# Patient Record
Sex: Male | Born: 1950 | Race: White | Hispanic: No | Marital: Married | Smoking: Never smoker
Health system: Southern US, Community
[De-identification: ages and names within clinical notes are randomized; demographics above are authoritative.]

## PROBLEM LIST (undated history)

## (undated) DIAGNOSIS — Z87442 Personal history of urinary calculi: Secondary | ICD-10-CM

## (undated) DIAGNOSIS — E669 Obesity, unspecified: Secondary | ICD-10-CM

## (undated) DIAGNOSIS — D373 Neoplasm of uncertain behavior of appendix: Secondary | ICD-10-CM

## (undated) DIAGNOSIS — M109 Gout, unspecified: Secondary | ICD-10-CM

## (undated) DIAGNOSIS — I219 Acute myocardial infarction, unspecified: Secondary | ICD-10-CM

## (undated) DIAGNOSIS — E119 Type 2 diabetes mellitus without complications: Secondary | ICD-10-CM

## (undated) DIAGNOSIS — M1990 Unspecified osteoarthritis, unspecified site: Secondary | ICD-10-CM

## (undated) DIAGNOSIS — M722 Plantar fascial fibromatosis: Secondary | ICD-10-CM

## (undated) DIAGNOSIS — R351 Nocturia: Secondary | ICD-10-CM

## (undated) DIAGNOSIS — I10 Essential (primary) hypertension: Secondary | ICD-10-CM

## (undated) DIAGNOSIS — M7650 Patellar tendinitis, unspecified knee: Secondary | ICD-10-CM

## (undated) DIAGNOSIS — Z86010 Personal history of colonic polyps: Secondary | ICD-10-CM

## (undated) DIAGNOSIS — I2510 Atherosclerotic heart disease of native coronary artery without angina pectoris: Secondary | ICD-10-CM

## (undated) DIAGNOSIS — M7989 Other specified soft tissue disorders: Secondary | ICD-10-CM

## (undated) DIAGNOSIS — E785 Hyperlipidemia, unspecified: Secondary | ICD-10-CM

## (undated) HISTORY — PX: HERNIA REPAIR: SHX51

## (undated) HISTORY — DX: Personal history of colonic polyps: Z86.010

## (undated) HISTORY — DX: Obesity, unspecified: E66.9

## (undated) HISTORY — DX: Atherosclerotic heart disease of native coronary artery without angina pectoris: I25.10

## (undated) HISTORY — DX: Neoplasm of uncertain behavior of appendix: D37.3

## (undated) HISTORY — DX: Gout, unspecified: M10.9

## (undated) HISTORY — PX: APPENDECTOMY: SHX54

## (undated) HISTORY — DX: Hyperlipidemia, unspecified: E78.5

## (undated) HISTORY — PX: TONSILLECTOMY: SUR1361

## (undated) HISTORY — DX: Essential (primary) hypertension: I10

---

## 1995-08-03 DIAGNOSIS — I219 Acute myocardial infarction, unspecified: Secondary | ICD-10-CM

## 1995-08-03 HISTORY — DX: Acute myocardial infarction, unspecified: I21.9

## 1995-08-03 HISTORY — PX: CORONARY ARTERY BYPASS GRAFT: SHX141

## 2005-01-07 ENCOUNTER — Ambulatory Visit: Payer: Self-pay | Admitting: Internal Medicine

## 2005-01-21 ENCOUNTER — Ambulatory Visit: Payer: Self-pay | Admitting: Internal Medicine

## 2005-02-17 ENCOUNTER — Ambulatory Visit: Payer: Self-pay | Admitting: Internal Medicine

## 2005-02-22 ENCOUNTER — Ambulatory Visit: Payer: Self-pay | Admitting: Internal Medicine

## 2005-03-03 ENCOUNTER — Ambulatory Visit: Payer: Self-pay | Admitting: Gastroenterology

## 2005-03-22 ENCOUNTER — Ambulatory Visit: Payer: Self-pay | Admitting: Internal Medicine

## 2005-03-30 ENCOUNTER — Ambulatory Visit: Payer: Self-pay | Admitting: Internal Medicine

## 2005-05-25 ENCOUNTER — Ambulatory Visit: Payer: Self-pay | Admitting: Internal Medicine

## 2005-06-07 ENCOUNTER — Ambulatory Visit: Payer: Self-pay | Admitting: Internal Medicine

## 2005-08-02 DIAGNOSIS — D373 Neoplasm of uncertain behavior of appendix: Secondary | ICD-10-CM

## 2005-08-02 HISTORY — PX: HEMICOLECTOMY: SHX854

## 2005-08-02 HISTORY — DX: Neoplasm of uncertain behavior of appendix: D37.3

## 2005-09-10 ENCOUNTER — Ambulatory Visit: Payer: Self-pay | Admitting: Internal Medicine

## 2005-09-24 ENCOUNTER — Ambulatory Visit: Payer: Self-pay | Admitting: Internal Medicine

## 2006-03-14 ENCOUNTER — Encounter (INDEPENDENT_AMBULATORY_CARE_PROVIDER_SITE_OTHER): Payer: Self-pay | Admitting: Specialist

## 2006-03-14 ENCOUNTER — Inpatient Hospital Stay (HOSPITAL_COMMUNITY): Admission: EM | Admit: 2006-03-14 | Discharge: 2006-03-20 | Payer: Self-pay | Admitting: Emergency Medicine

## 2006-03-14 ENCOUNTER — Ambulatory Visit: Payer: Self-pay | Admitting: Cardiovascular Disease

## 2006-03-14 ENCOUNTER — Ambulatory Visit: Payer: Self-pay | Admitting: Internal Medicine

## 2006-08-01 ENCOUNTER — Ambulatory Visit: Payer: Self-pay | Admitting: Internal Medicine

## 2006-08-01 LAB — CONVERTED CEMR LAB
ALT: 45 units/L — ABNORMAL HIGH (ref 0–40)
AST: 32 units/L (ref 0–37)
Albumin: 4.2 g/dL (ref 3.5–5.2)
Alkaline Phosphatase: 45 units/L (ref 39–117)
BUN: 19 mg/dL (ref 6–23)
Basophils Absolute: 0 10*3/uL (ref 0.0–0.1)
Basophils Relative: 0.6 % (ref 0.0–1.0)
CO2: 33 meq/L — ABNORMAL HIGH (ref 19–32)
Calcium: 9.1 mg/dL (ref 8.4–10.5)
Chloride: 102 meq/L (ref 96–112)
Chol/HDL Ratio, serum: 4.3
Cholesterol: 209 mg/dL (ref 0–200)
Creatinine, Ser: 0.9 mg/dL (ref 0.4–1.5)
Eosinophil percent: 1.8 % (ref 0.0–5.0)
GFR calc non Af Amer: 93 mL/min
Glomerular Filtration Rate, Af Am: 113 mL/min/{1.73_m2}
Glucose, Bld: 99 mg/dL (ref 70–99)
HCT: 40 % (ref 39.0–52.0)
HDL: 48.1 mg/dL (ref >39.0)
Hemoglobin: 13.7 g/dL (ref 13.0–17.0)
LDL DIRECT: 114.2 mg/dL
Lymphocytes Relative: 52.2 % — ABNORMAL HIGH (ref 12.0–46.0)
MCHC: 34.3 g/dL (ref 30.0–36.0)
MCV: 91.5 fL (ref 78.0–100.0)
Monocytes Absolute: 0.6 10*3/uL (ref 0.2–0.7)
Monocytes Relative: 11.3 % — ABNORMAL HIGH (ref 3.0–11.0)
Neutro Abs: 1.8 10*3/uL (ref 1.4–7.7)
Neutrophils Relative %: 34.1 % — ABNORMAL LOW (ref 43.0–77.0)
PSA: 0.42 ng/mL (ref 0.10–4.00)
Platelets: 215 10*3/uL (ref 150–400)
Potassium: 4.7 meq/L (ref 3.5–5.1)
RBC: 4.37 M/uL (ref 4.22–5.81)
RDW: 12.8 % (ref 11.5–14.6)
Sodium: 139 meq/L (ref 135–145)
TSH: 4.93 microintl units/mL (ref 0.35–5.50)
Total Bilirubin: 1.1 mg/dL (ref 0.3–1.2)
Total Protein: 6.9 g/dL (ref 6.0–8.3)
Triglyceride fasting, serum: 269 mg/dL (ref 0–149)
VLDL: 54 mg/dL — ABNORMAL HIGH (ref 0–40)
WBC: 5.3 10*3/uL (ref 4.5–10.5)

## 2006-08-09 ENCOUNTER — Ambulatory Visit: Payer: Self-pay | Admitting: Internal Medicine

## 2006-08-23 ENCOUNTER — Encounter: Admission: RE | Admit: 2006-08-23 | Discharge: 2006-08-23 | Payer: Self-pay | Admitting: Surgery

## 2006-09-08 ENCOUNTER — Inpatient Hospital Stay (HOSPITAL_COMMUNITY): Admission: RE | Admit: 2006-09-08 | Discharge: 2006-09-11 | Payer: Self-pay | Admitting: Surgery

## 2006-11-08 ENCOUNTER — Ambulatory Visit: Payer: Self-pay | Admitting: Internal Medicine

## 2007-02-21 ENCOUNTER — Ambulatory Visit: Payer: Self-pay | Admitting: Internal Medicine

## 2007-02-21 DIAGNOSIS — E785 Hyperlipidemia, unspecified: Secondary | ICD-10-CM | POA: Insufficient documentation

## 2007-02-21 DIAGNOSIS — I2510 Atherosclerotic heart disease of native coronary artery without angina pectoris: Secondary | ICD-10-CM | POA: Insufficient documentation

## 2007-02-21 DIAGNOSIS — N200 Calculus of kidney: Secondary | ICD-10-CM | POA: Insufficient documentation

## 2007-02-21 DIAGNOSIS — I10 Essential (primary) hypertension: Secondary | ICD-10-CM | POA: Insufficient documentation

## 2007-03-09 ENCOUNTER — Encounter: Payer: Self-pay | Admitting: Internal Medicine

## 2007-03-09 ENCOUNTER — Ambulatory Visit: Payer: Self-pay

## 2007-03-14 ENCOUNTER — Telehealth: Payer: Self-pay | Admitting: Internal Medicine

## 2007-04-15 IMAGING — CR DG CHEST 2V
2 series · 2 of 2 positions shown · non-contrast
Comparison: None.

CLINICAL DATA: Preop for ventral hernia/chest pain with inspiration.
 CHEST - 2 VIEW:

[view not recorded (1 of 2)]
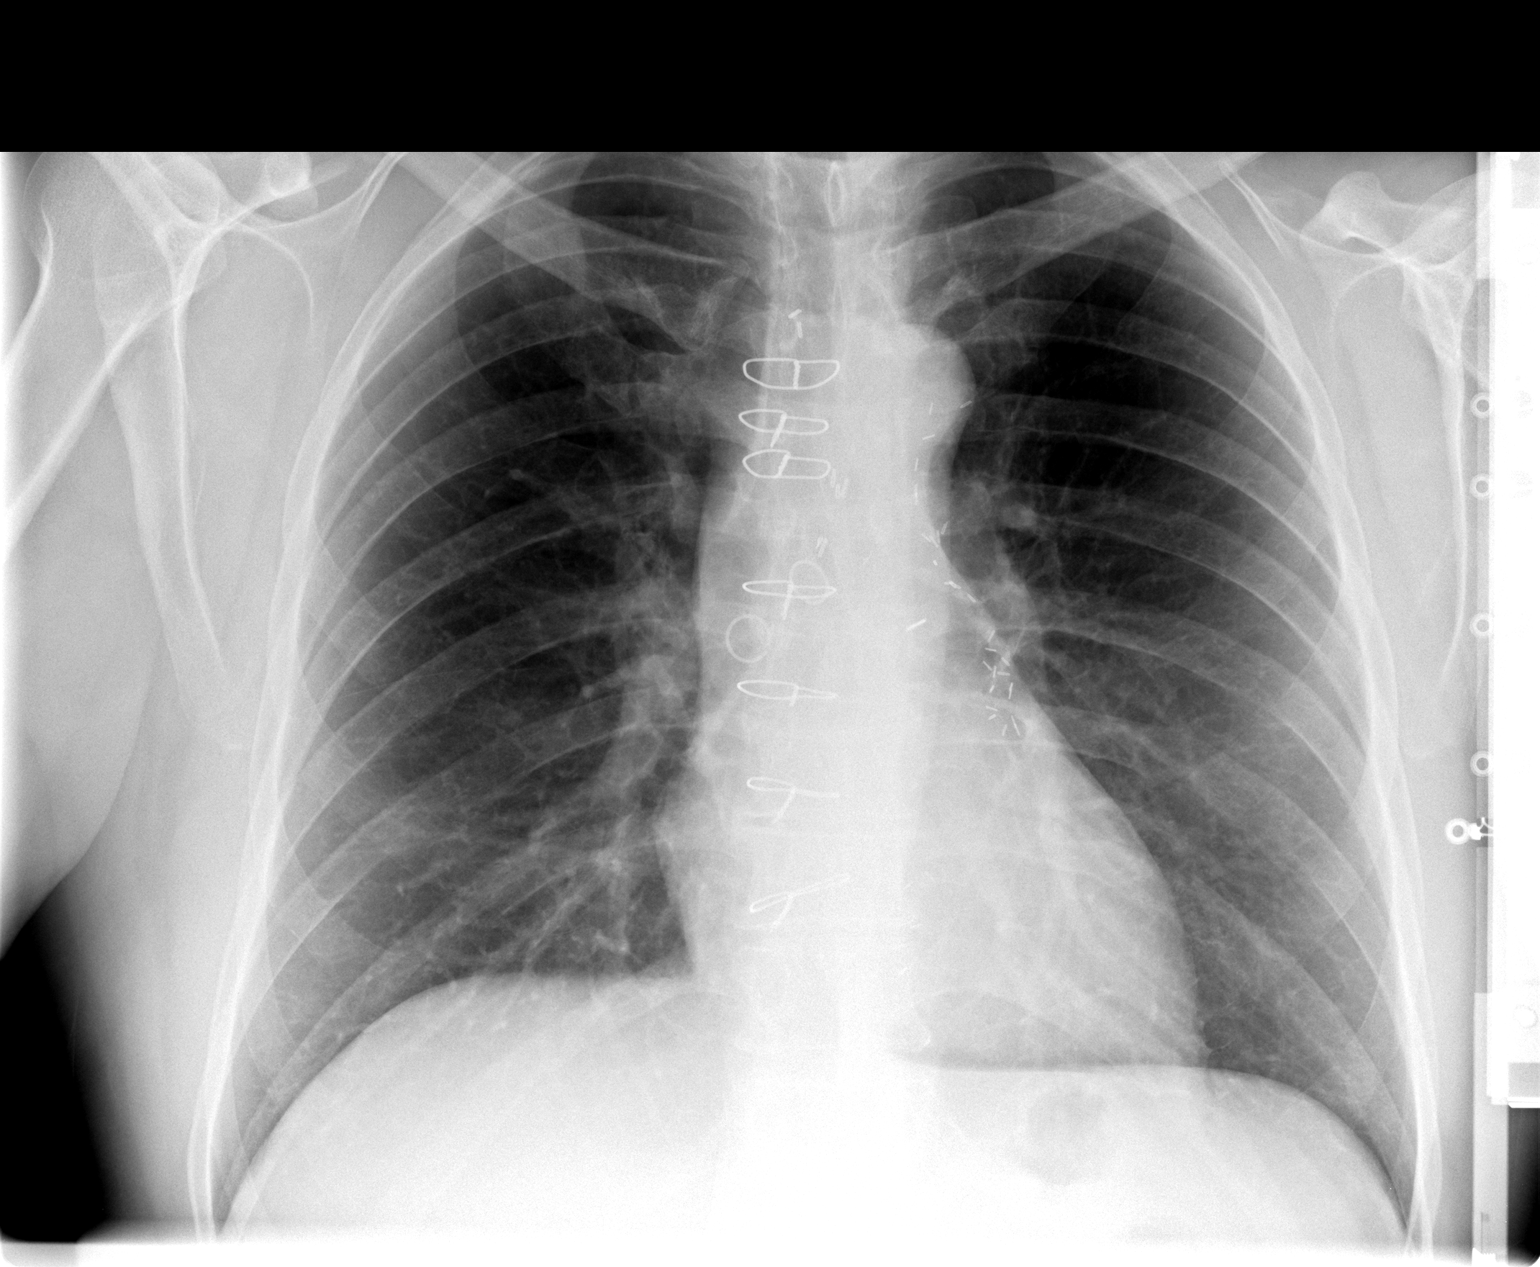

[view not recorded (2 of 2)]
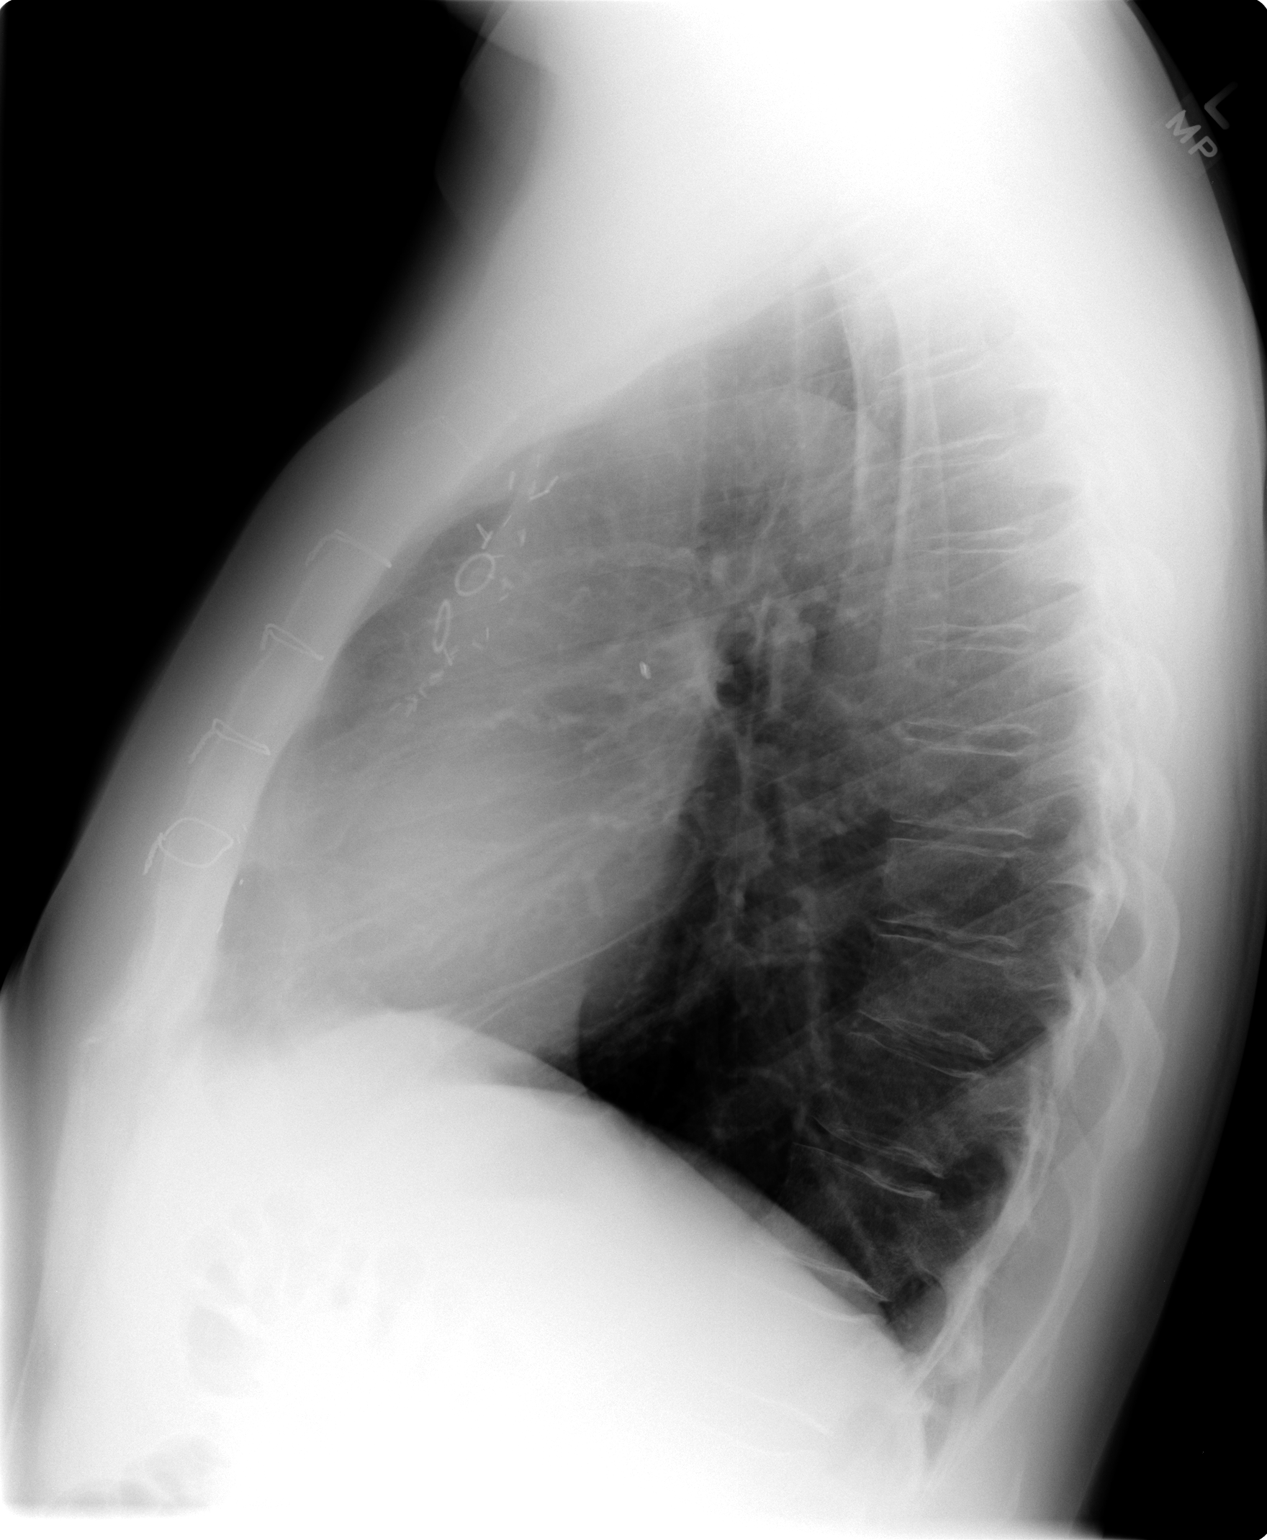

[2 of 2 positions shown; findings below may reference images not displayed]

FINDINGS: Post-coronary bypass graft. Heart and lungs normal. No pleural fluid or osseous lesions.
IMPRESSION: Post-CABG ? no active disease.

## 2007-07-06 ENCOUNTER — Telehealth: Payer: Self-pay | Admitting: Internal Medicine

## 2007-11-30 ENCOUNTER — Telehealth: Payer: Self-pay | Admitting: Internal Medicine

## 2008-01-01 ENCOUNTER — Ambulatory Visit: Payer: Self-pay | Admitting: Internal Medicine

## 2008-01-01 LAB — CONVERTED CEMR LAB
ALT: 83 units/L — ABNORMAL HIGH (ref 0–53)
AST: 69 units/L — ABNORMAL HIGH (ref 0–37)
Albumin: 4.1 g/dL (ref 3.5–5.2)
BUN: 14 mg/dL (ref 6–23)
Basophils Absolute: 0 10*3/uL (ref 0.0–0.1)
Basophils Relative: 0.1 % (ref 0.0–1.0)
Bilirubin Urine: NEGATIVE
CO2: 27 meq/L (ref 19–32)
Calcium: 9.5 mg/dL (ref 8.4–10.5)
Chloride: 103 meq/L (ref 96–112)
Cholesterol: 146 mg/dL (ref 0–200)
Creatinine, Ser: 1 mg/dL (ref 0.4–1.5)
Eosinophils Relative: 2 % (ref 0.0–5.0)
Glucose, Bld: 89 mg/dL (ref 70–99)
Glucose, Urine, Semiquant: NEGATIVE
Hemoglobin: 13.7 g/dL (ref 13.0–17.0)
LDL Cholesterol: 76 mg/dL (ref 0–99)
Lymphocytes Relative: 48 % — ABNORMAL HIGH (ref 12.0–46.0)
MCHC: 34.6 g/dL (ref 30.0–36.0)
Neutro Abs: 1.7 10*3/uL (ref 1.4–7.7)
Neutrophils Relative %: 34.6 % — ABNORMAL LOW (ref 43.0–77.0)
PSA: 0.53 ng/mL (ref 0.10–4.00)
RBC: 4.18 M/uL — ABNORMAL LOW (ref 4.22–5.81)
TSH: 3.17 microintl units/mL (ref 0.35–5.50)
Total Protein: 7.2 g/dL (ref 6.0–8.3)
Urobilinogen, UA: 0.2
WBC Urine, dipstick: NEGATIVE
WBC: 4.8 10*3/uL (ref 4.5–10.5)
pH: 5

## 2008-01-29 ENCOUNTER — Ambulatory Visit: Payer: Self-pay | Admitting: Internal Medicine

## 2008-12-25 ENCOUNTER — Encounter: Payer: Self-pay | Admitting: Internal Medicine

## 2009-02-21 ENCOUNTER — Ambulatory Visit: Payer: Self-pay | Admitting: Family Medicine

## 2009-07-02 ENCOUNTER — Telehealth: Payer: Self-pay | Admitting: Internal Medicine

## 2009-07-04 ENCOUNTER — Telehealth: Payer: Self-pay | Admitting: Internal Medicine

## 2009-07-11 ENCOUNTER — Ambulatory Visit: Payer: Self-pay | Admitting: Internal Medicine

## 2009-07-11 LAB — CONVERTED CEMR LAB
ALT: 57 units/L — ABNORMAL HIGH (ref 0–53)
Alkaline Phosphatase: 53 units/L (ref 39–117)
BUN: 15 mg/dL (ref 6–23)
Basophils Absolute: 0 10*3/uL (ref 0.0–0.1)
Bilirubin, Direct: 0.1 mg/dL (ref 0.0–0.3)
Creatinine, Ser: 0.8 mg/dL (ref 0.4–1.5)
Direct LDL: 96 mg/dL
Eosinophils Absolute: 0.1 10*3/uL (ref 0.0–0.7)
GFR calc non Af Amer: 105.44 mL/min (ref >60)
Glucose, Bld: 111 mg/dL — ABNORMAL HIGH (ref 70–99)
HCT: 40 % (ref 39.0–52.0)
Hemoglobin: 13.3 g/dL (ref 13.0–17.0)
Lymphocytes Relative: 44.2 % (ref 12.0–46.0)
Lymphs Abs: 2 10*3/uL (ref 0.7–4.0)
MCHC: 33.1 g/dL (ref 30.0–36.0)
Monocytes Relative: 11.4 % (ref 3.0–12.0)
Neutro Abs: 1.8 10*3/uL (ref 1.4–7.7)
Platelets: 169 10*3/uL (ref 150.0–400.0)
Potassium: 4.4 meq/L (ref 3.5–5.1)
RDW: 12.3 % (ref 11.5–14.6)
TSH: 2.42 microintl units/mL (ref 0.35–5.50)
Total Bilirubin: 1.2 mg/dL (ref 0.3–1.2)
Total Protein: 7.8 g/dL (ref 6.0–8.3)

## 2009-07-23 ENCOUNTER — Ambulatory Visit: Payer: Self-pay | Admitting: Internal Medicine

## 2009-09-01 ENCOUNTER — Ambulatory Visit: Payer: Self-pay | Admitting: Internal Medicine

## 2009-09-01 DIAGNOSIS — M109 Gout, unspecified: Secondary | ICD-10-CM | POA: Insufficient documentation

## 2009-09-04 ENCOUNTER — Telehealth: Payer: Self-pay | Admitting: Internal Medicine

## 2009-09-04 ENCOUNTER — Ambulatory Visit: Payer: Self-pay | Admitting: Internal Medicine

## 2009-09-11 ENCOUNTER — Encounter: Payer: Self-pay | Admitting: Internal Medicine

## 2009-09-12 ENCOUNTER — Telehealth: Payer: Self-pay | Admitting: Internal Medicine

## 2009-10-03 ENCOUNTER — Ambulatory Visit: Payer: Self-pay | Admitting: Internal Medicine

## 2010-07-03 ENCOUNTER — Ambulatory Visit: Payer: Self-pay | Admitting: Internal Medicine

## 2010-07-03 LAB — CONVERTED CEMR LAB
Albumin: 4.6 g/dL (ref 3.5–5.2)
Alkaline Phosphatase: 59 units/L (ref 39–117)
Basophils Relative: 0.8 % (ref 0.0–3.0)
CO2: 30 meq/L (ref 19–32)
Chloride: 98 meq/L (ref 96–112)
Direct LDL: 91.6 mg/dL
Eosinophils Absolute: 0.1 10*3/uL (ref 0.0–0.7)
Glucose, Bld: 108 mg/dL — ABNORMAL HIGH (ref 70–99)
HCT: 39.9 % (ref 39.0–52.0)
Hemoglobin: 13.6 g/dL (ref 13.0–17.0)
Lymphocytes Relative: 40.4 % (ref 12.0–46.0)
Lymphs Abs: 2.2 10*3/uL (ref 0.7–4.0)
MCHC: 34.1 g/dL (ref 30.0–36.0)
MCV: 95 fL (ref 78.0–100.0)
Neutro Abs: 2.5 10*3/uL (ref 1.4–7.7)
Nitrite: NEGATIVE
Potassium: 4.9 meq/L (ref 3.5–5.1)
RBC: 4.2 M/uL — ABNORMAL LOW (ref 4.22–5.81)
Sodium: 136 meq/L (ref 135–145)
Specific Gravity, Urine: 1.02
TSH: 3.09 microintl units/mL (ref 0.35–5.50)
Total CHOL/HDL Ratio: 4
Total Protein: 7.3 g/dL (ref 6.0–8.3)
Urobilinogen, UA: 0.2

## 2010-07-20 ENCOUNTER — Ambulatory Visit: Payer: Self-pay | Admitting: Internal Medicine

## 2010-08-11 ENCOUNTER — Encounter: Payer: Self-pay | Admitting: Internal Medicine

## 2010-08-30 LAB — CONVERTED CEMR LAB
ALT: 67 units/L — ABNORMAL HIGH (ref 0–53)
AST: 49 units/L — ABNORMAL HIGH (ref 0–37)
Albumin: 4.5 g/dL (ref 3.5–5.2)
Alkaline Phosphatase: 57 units/L (ref 39–117)
Basophils Absolute: 0 10*3/uL (ref 0.0–0.1)
Basophils Relative: 0.6 % (ref 0.0–1.0)
Bilirubin, Direct: 0.1 mg/dL (ref 0.0–0.3)
CO2: 27 meq/L (ref 19–32)
Chloride: 95 meq/L — ABNORMAL LOW (ref 96–112)
Cholesterol: 203 mg/dL (ref 0–200)
Direct LDL: 106.1 mg/dL
Eosinophils Absolute: 0 10*3/uL (ref 0.0–0.6)
Eosinophils Relative: 0.7 % (ref 0.0–5.0)
HCT: 37.6 % — ABNORMAL LOW (ref 39.0–52.0)
HDL: 46.3 mg/dL (ref >39.0)
Hemoglobin: 13.3 g/dL (ref 13.0–17.0)
Lymphocytes Relative: 28.7 % (ref 12.0–46.0)
MCHC: 35.4 g/dL (ref 30.0–36.0)
MCV: 91.2 fL (ref 78.0–100.0)
Monocytes Absolute: 0.6 10*3/uL (ref 0.2–0.7)
Monocytes Relative: 10.9 % (ref 3.0–11.0)
Neutro Abs: 3.5 10*3/uL (ref 1.4–7.7)
Neutrophils Relative %: 59.1 % (ref 43.0–77.0)
Platelets: 217 10*3/uL (ref 150–400)
Potassium: 4.6 meq/L (ref 3.5–5.1)
RBC: 4.13 M/uL — ABNORMAL LOW (ref 4.22–5.81)
RDW: 13.4 % (ref 11.5–14.6)
Sodium: 130 meq/L — ABNORMAL LOW (ref 135–145)
Total Bilirubin: 1 mg/dL (ref 0.3–1.2)
Total CHOL/HDL Ratio: 4.4
Total Protein: 7.6 g/dL (ref 6.0–8.3)
Triglycerides: 244 mg/dL (ref 0–149)
VLDL: 49 mg/dL — ABNORMAL HIGH (ref 0–40)
WBC: 5.7 10*3/uL (ref 4.5–10.5)

## 2010-09-03 NOTE — Assessment & Plan Note (Signed)
Summary: cpx//ccm   Vital Signs:  Patient profile:   60 year old male Height:      72.25 inches Weight:      256 pounds BMI:     34.60 Temp:     98.6 degrees F oral Pulse rate:   80 / minute Pulse rhythm:   regular BP sitting:   156 / 98  (left arm) Cuff size:   large  Vitals Entered By: Alfred Levins, CMA (July 20, 2010 10:02 AM)   CC: cpx   CC:  cpx.  History of Present Illness: CPX  not exercising: has gained some weight not following a diet.   Current Problems (verified): 1)  Gout  (ICD-274.9) 2)  Physical Examination  (ICD-V70.0) 3)  Renal Stone  (ICD-592.9) 4)  Hypertension  (ICD-401.9) 5)  Hyperlipidemia  (ICD-272.4) 6)  Coronary Artery Disease  (ICD-414.00)  Current Medications (verified): 1)  Lisinopril 40 Mg Tabs (Lisinopril) .Marland Kitchen.. 1 By Mouth Once Daily 2)  Crestor 20 Mg Tabs (Rosuvastatin Calcium) .Marland Kitchen.. 1 Tablet By Mouth Daily 3)  Aspirin Ec 325 Mg  Tbec (Aspirin) .... Once Daily 4)  Multivitamins   Tabs (Multiple Vitamin) .... Once Daily 5)  Prednisone 10 Mg  Tabs (Prednisone) .... For 9 Days At Onset of Gout  Allergies (verified): No Known Drug Allergies  Past History:  Past Medical History: Last updated: 02/21/2007 Coronary artery disease Hyperlipidemia Hypertension  Past Surgical History: Last updated: 01/29/2008 Coronary artery bypass graft Tonsillectomy appendeceal tumor (malignant) hernia---post op---mesh.  Social History: Last updated: 02/21/2007 Occupation: teacher Married  Risk Factors: Smoking Status: never (10/03/2009)  Physical Exam  General:  well-developed male in no acute distress. Overweight. HEENT exam atraumatic, normocephalic symmetric her muscles are intact. Neck is supple without lymphadenopathy or thyromegaly. Chest clear auscultation cardiac exam S1-S2 are regular. Abdominal exam overweight, to bowel sounds, soft and nontender. Extremities no clubbing cyanosis or edema. Neurologic exam alert and  oriented.   Impression & Recommendations:  Problem # 1:  PHYSICAL EXAMINATION (ICD-V70.0)  Health maint UTD advised regular exercise discussing for weight loss. I suspect he has some steatohepatitis related to his weight. I also suspect that his blood sugars elevated because of his weight. I told over the next year he will knees concentrate on getting to less than 200 pounds. Is because account for him. I've also asked him to monitor his blood pressure. He will call me if blood pressure remains over 140.   Orders: Gastroenterology Referral (GI)  Problem # 2:  GOUT (ICD-274.9) rare recurrence  Problem # 3:  HYPERTENSION (ICD-401.9)  His updated medication list for this problem includes:    Lisinopril 40 Mg Tabs (Lisinopril) .Marland Kitchen... 1 by mouth once daily  BP today: 156/98 Prior BP: 138/88 (10/03/2009)  Labs Reviewed: K+: 4.9 (07/03/2010) Creat: : 0.9 (07/03/2010)   Chol: 179 (07/03/2010)   HDL: 41.80 (07/03/2010)   LDL: 76 (01/01/2008)   TG: 273.0 (07/03/2010)  Problem # 4:  HYPERLIPIDEMIA (ICD-272.4)  His updated medication list for this problem includes:    Crestor 20 Mg Tabs (Rosuvastatin calcium) .Marland Kitchen... 1 tablet by mouth daily  Labs Reviewed: SGOT: 60 (07/03/2010)   SGPT: 82 (07/03/2010)   HDL:41.80 (07/03/2010), 44.50 (07/11/2009)  LDL:76 (01/01/2008), DEL (47/82/9562)  Chol:179 (07/03/2010), 179 (07/11/2009)  Trig:273.0 (07/03/2010), 226.0 (07/11/2009)  Problem # 5:  CORONARY ARTERY DISEASE (ICD-414.00)  His updated medication list for this problem includes:    Lisinopril 40 Mg Tabs (Lisinopril) .Marland Kitchen... 1 by mouth once daily  Aspirin Ec 325 Mg Tbec (Aspirin) ..... Once daily  Labs Reviewed: Chol: 179 (07/03/2010)   HDL: 41.80 (07/03/2010)   LDL: 76 (01/01/2008)   TG: 273.0 (07/03/2010)  Complete Medication List: 1)  Lisinopril 40 Mg Tabs (Lisinopril) .Marland Kitchen.. 1 by mouth once daily 2)  Crestor 20 Mg Tabs (Rosuvastatin calcium) .Marland Kitchen.. 1 tablet by mouth daily 3)  Aspirin Ec  325 Mg Tbec (Aspirin) .... Once daily 4)  Multivitamins Tabs (Multiple vitamin) .... Once daily 5)  Prednisone 10 Mg Tabs (Prednisone) .... 3 by mouth once daily for 5 days as needed for gout flare  Patient Instructions: 1)  monitor your BP at home: goal < 130/85 Prescriptions: PREDNISONE 10 MG  TABS (PREDNISONE) 3 by mouth once daily for 5 days as needed for gout flare  #20 x 1   Entered and Authorized by:   Birdie Sons MD   Signed by:   Birdie Sons MD on 07/20/2010   Method used:   Electronically to        [facility]. [phone]* (retail)       [phone]       [place], [place]  [address]       Ph: [phone]       Fax: [phone]   RxID:   [id number]    Orders Added: 1)  Est. Patient 40-64 years [02725] 2)  Gastroenterology Referral [GI]

## 2010-09-03 NOTE — Progress Notes (Signed)
Summary: pt said his is still having problems with swelling. No pain  Phone Note Call from Patient Call back at Home Phone [phone]   Caller: Patient--VM Call For: Birdie Sons MD Summary of Call: Has seen dr Penni Bombard at [facility] orthopedic and told probably has gout as toe was red.  Uric acid good and sed rate high at present  so maybe false gout.  Suggested rheumatology appt but warned takes a month to get appt..  Able to walk w/o crutch, but then gets swelling.  Toe no longer red.  Sweeling goes down with elevation and ice.  Wants to know if any suggestions?  (By lab uric acid 3.3 and sed rate 50) Initial call taken by: Gladis Riffle, RN,  September 12, 2009 3:57 PM  Follow-up for Phone Call        [facility]  to call back Follow-up by: Birdie Sons MD,  September 15, 2009 4:32 PM  Additional Follow-up for Phone Call Additional follow up Details #1::        returning your call, at home after 3:30 - [phone] Additional Follow-up by: Raechel Ache, RN,  September 16, 2009 1:05 PM    Additional Follow-up for Phone Call Additional follow up Details #2::    Pt called back and is still having swelling but no pain. Pt is taking Naxopren in the morning. Pt is sch to come in to see Dr. Cato Mulligan on Friday 10/03/09 at 10:45   [phone] work  Follow-up by: Lucy Antigua,  September 26, 2009 11:06 AM

## 2010-09-03 NOTE — Progress Notes (Signed)
Summary: please advise  Phone Note Call from Patient Call back at Home Phone [phone]   Caller: [facility] mail Summary of Call: Was seen this week with foot problem. Needs advice of medications. Initial call taken by: Warnell Forester,  September 04, 2009 8:24 AM  Follow-up for Phone Call        Has stopped hctz and taken naproxen two times a day x 4 days as directed and no better.  Now has soreness in arch, achilles tendon, bottom of foot and feel like has a lump left half of heel to arch.  Has done elevation and ice and now unable to walk on it.  Willing for prednisone and ankle xray if you still choose.  [facility] [place] Follow-up by: Gladis Riffle, RN,  September 04, 2009 8:38 AM  Additional Follow-up for Phone Call Additional follow up Details #1::        per dr Kristian Hazzard right ankle xray.Patient notified. Ok to take vicodin has per dr Haniya Fern.Patient notified.  see order Additional Follow-up by: Gladis Riffle, RN,  September 04, 2009 8:52 AM

## 2010-09-03 NOTE — Assessment & Plan Note (Signed)
Summary: consult re: gout/swelling/cjr   Vital Signs:  Patient profile:   60 year old male Weight:      260 pounds Pulse rate:   60 / minute Pulse rhythm:   regular Resp:     12 per minute BP sitting:   138 / 88  (left arm) Cuff size:   regular  Vitals Entered By: Gladis Riffle, RN (October 03, 2009 10:52 AM) CC: consultation gout and swelling right ankle--has seen dr Dareen Piano Is Patient Diabetic? No   CC:  consultation gout and swelling right ankle--has seen dr Dareen Piano.  Preventive Screening-Counseling & Management  Alcohol-Tobacco     Smoking Status: never  Current Medications (verified): 1)  Lisinopril 40 Mg Tabs (Lisinopril) .Marland Kitchen.. 1 By Mouth Once Daily 2)  Crestor 20 Mg Tabs (Rosuvastatin Calcium) .Marland Kitchen.. 1 Tablet By Mouth Daily 3)  Aspirin Ec 325 Mg  Tbec (Aspirin) .... Once Daily 4)  Multivitamins   Tabs (Multiple Vitamin) .... Once Daily 5)  Naproxen 500 Mg Tabs (Naproxen) .... Take One Two Times A Day As Needed  Allergies (verified): No Known Drug Allergies   Impression & Recommendations:  Problem # 1:  GOUT (ICD-274.9) discussed at length (weight loss encouraged) continue current meds total time 15 minutes  Complete Medication List: 1)  Lisinopril 40 Mg Tabs (Lisinopril) .Marland Kitchen.. 1 by mouth once daily 2)  Crestor 20 Mg Tabs (Rosuvastatin calcium) .Marland Kitchen.. 1 tablet by mouth daily 3)  Aspirin Ec 325 Mg Tbec (Aspirin) .... Once daily 4)  Multivitamins Tabs (Multiple vitamin) .... Once daily 5)  Prednisone 10 Mg Tabs (Prednisone) .... 3 po qd for 3 days, then 2 po qd for 3 days, then 1 po qd for 3 days, then 1/2 po qd for 3 days Prescriptions: PREDNISONE 10 MG TABS (PREDNISONE) 3 po qd for 3 days, then 2 po qd for 3 days, then 1 po qd for 3 days, then 1/2 po qd for 3 days  #25 x 0   Entered and Authorized by:   Birdie Sons MD   Signed by:   Birdie Sons MD on 10/03/2009   Method used:   Print then Give to Patient   RxID:   [id number]

## 2010-09-03 NOTE — Assessment & Plan Note (Signed)
Summary: R ANKLE PAIN / SWELLING // RS   Vital Signs:  Patient profile:   60 year old male Weight:      252 pounds Temp:     98 degrees F Pulse rate:   66 / minute Resp:     12 per minute BP sitting:   132 / 90  (left arm)  Vitals Entered By: Gladis Riffle, RN (September 01, 2009 9:24 AM) CC: right ankle pain x 3 days, taking naroxen with slight relief Is Patient Diabetic? No   CC:  right ankle pain x 3 days and taking naroxen with slight relief.  History of Present Illness: right ankle pain no acute injury pain started 3 days ago some erythema pain moderate to severe with walking.   hx of kidney stone---never analyzed  All other systems reviewed and were negative   Preventive Screening-Counseling & Management  Alcohol-Tobacco     Smoking Status: never  Current Problems (verified): 1)  Foot Pain  (ICD-729.5) 2)  Physical Examination  (ICD-V70.0) 3)  Renal Stone  (ICD-592.9) 4)  Hypertension  (ICD-401.9) 5)  Hyperlipidemia  (ICD-272.4) 6)  Coronary Artery Disease  (ICD-414.00)  Current Medications (verified): 1)  Lisinopril-Hydrochlorothiazide 20-12.5 Mg  Tabs (Lisinopril-Hydrochlorothiazide) .... Take 2 Tablet By Mouth Once Daily 2)  Crestor 20 Mg Tabs (Rosuvastatin Calcium) .Marland Kitchen.. 1 Tablet By Mouth Daily 3)  Aspirin Ec 325 Mg  Tbec (Aspirin) .... Once Daily 4)  Multivitamins   Tabs (Multiple Vitamin) .... Once Daily 5)  Naproxen 500 Mg Tabs (Naproxen) .... Take One Two Times A Day As Needed  Allergies (verified): No Known Drug Allergies  Comments:  Nurse/Medical Assistant: c/o right ankle pain x 3 days, taking naproxen with some relief but wants to discuss--states swelling some better  The patient's medications and allergies were reviewed with the patient and were updated in the Medication and Allergy Lists. Gladis Riffle, RN (September 01, 2009 9:27 AM)  Past History:  Past Medical History: Last updated: 02/21/2007 Coronary artery  disease Hyperlipidemia Hypertension  Past Surgical History: Last updated: 01/29/2008 Coronary artery bypass graft Tonsillectomy appendeceal tumor (malignant) hernia---post op---mesh.  Social History: Last updated: 02/21/2007 Occupation: Runner, broadcasting/film/video Married  Risk Factors: Smoking Status: never (09/01/2009)  Social History: Smoking Status:  never  Review of Systems       All other systems reviewed and were negative   Physical Exam  General:  Well-developed,well-nourished,in no acute distress; alert,appropriate and cooperative throughout examination Head:  normocephalic and atraumatic.   Msk:  FROM right ankle minimal erythema right lateral malleolus sweeling right lateral malleolus   Impression & Recommendations:  Problem # 1:  HYPERTENSION (ICD-401.9) controlled His updated medication list for this problem includes:    Lisinopril 40 Mg Tabs (Lisinopril) .Marland Kitchen... 1 by mouth once daily  Problem # 2:  HYPERLIPIDEMIA (ICD-272.4)  His updated medication list for this problem includes:    Crestor 20 Mg Tabs (Rosuvastatin calcium) .Marland Kitchen... 1 tablet by mouth daily  Labs Reviewed: SGOT: 46 (07/11/2009)   SGPT: 57 (07/11/2009)   HDL:44.50 (07/11/2009), 40.2 (01/01/2008)  LDL:76 (01/01/2008), DEL (13/03/6577)  Chol:179 (07/11/2009), 146 (01/01/2008)  Trig:226.0 (07/11/2009), 147 (01/01/2008)  Problem # 3:  GOUT (ICD-274.9) likely contributed to by hctz  naprxien two times a day for another 4 days stop hctz  Complete Medication List: 1)  Lisinopril 40 Mg Tabs (Lisinopril) .Marland Kitchen.. 1 by mouth once daily 2)  Crestor 20 Mg Tabs (Rosuvastatin calcium) .Marland Kitchen.. 1 tablet by mouth daily 3)  Aspirin Ec 325 Mg  Tbec (Aspirin) .... Once daily 4)  Multivitamins Tabs (Multiple vitamin) .... Once daily 5)  Naproxen 500 Mg Tabs (Naproxen) .... Take one two times a day as needed Prescriptions: LISINOPRIL 40 MG TABS (LISINOPRIL) 1 by mouth once daily  #90 x 3   Entered and Authorized by:   Birdie Sons MD   Signed by:   Birdie Sons MD on 09/01/2009   Method used:   Electronically to        [facility]. [phone]* (retail)       [phone]       [place], [place]  [address]       Ph: [phone]       Fax: [phone]   RxID:   [id number]

## 2010-09-03 NOTE — Letter (Signed)
Summary: Generic Letter  [facility]  [address] [place], [place] [address]   Phone: [phone]  Fax: [phone]        08/11/2010    To Whom it may concern,  Zaden Sako, DOB [birth year], had a complete physical on 07/20/10 and labs on 07/03/10.  Please call me with any question.  Thank you.   Sincerely,    Birdie Sons, MD

## 2010-09-03 NOTE — Progress Notes (Signed)
Summary: FYI  Phone Note Call from Patient   Caller: Patient Call For: Birdie Sons MD Summary of Call: Pt went to the Orthopedist and wanted Dr. 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  He would like to speak to Dr. Cato Mulligan if he could? [phone] Suggested an office visit. Initial call taken by: Lynann Beaver CMA,  September 12, 2009 12:17 PM

## 2010-10-09 ENCOUNTER — Other Ambulatory Visit: Payer: Self-pay | Admitting: Internal Medicine

## 2010-12-18 NOTE — Op Note (Signed)
NAME:  Shawn Anderson, Shawn Anderson NO.:  [id number]   MEDICAL RECORD NO.:  [id number]          PATIENT TYPE:  INP   LOCATION:  1610                         FACILITY:  [facility]   PHYSICIAN:  Lebron Conners, M.D.   DATE OF BIRTH:  Feb 24, 1951   DATE OF PROCEDURE:  03/14/2006  DATE OF DISCHARGE:                                 OPERATIVE REPORT   PREOPERATIVE DIAGNOSES:  Acute appendicitis, possible cecal tumor.   POSTOPERATIVE DIAGNOSES:  Acute appendicitis, tumor at the base of cecum.   OPERATION:  Ileal colectomy with anastomosis.   SURGEON:  Dr. Lebron Conners.   ANESTHESIA:  General.   SPECIMENS:  Right colon and distal ileum.   BLOOD LOSS:  About 150 mL.   COMPLICATIONS:  None.   CONDITION:  To PACU good.   CLINICAL NOTE:  Shawn Anderson is a 60 year old man whose had a 3-day history of  right lower quadrant abdominal pain.  He had no fever or leukocytosis but a  CT scan showed findings compatible with acute appendicitis and possible  tumor at the base of the appendix bulging into the cecum.  I recommended  that he have an open right colectomy.  He agreed with that and was taken to  the operating room for that.   DESCRIPTION OF PROCEDURE:  After the patient was monitored and asleep and  had a Foley catheter and routine preparation and draping of the abdomen, I  made a short midline incision going around the umbilicus.  I felt the  abdominal contents and found no abnormality of the liver.  I did not feel  any gallstones.  The stomach and duodenum appeared and felt normal.  The  small intestines appeared and felt normal.  There was some diverticulosis of  the left colon.  There was a mass associated with the cecum.  I exposed the  cecum and cut the adhesions lateral to it and carried the dissection down  along the distal ileum and freed it up and found that there seemed to be a  limited tumor within the cecum.  I did not feel any enlarged lymph nodes.  The tumor was  right at the base of the appendix and appeared to be blocking  the appendix.  The appendix did not appear to be ruptured or gangrenous.  I  thought that there was a good chance this was carcinoma and planned for  right colectomy.  I continued the dissection upward along the right gutter  cutting the peritoneum.  I noted the duodenum and spared it from harm.  I  took the dissection around the hepatic flexure using the cautery and the  ligatures as needed.  I separated omentum from the mesentery about the mid  transverse colon and elected a site of resection.  I divided the bowel  proximally and distally with the cutting stapler and then segmentally  divided mesentery between Kelly clamps and ligated the vessels with 2-0  silks.  At the conclusion of that, the ends of the bowel appeared nice and  pink and closures appeared secure.  I brought the  ends of bowel together  side-by-side and performed a side-to-side anastomosis done a few centimeters  away from the staple lines utilizing the cutting stapler.  I checked inside  and found hemostasis was good within the colon.  I closed the remaining  defect with interrupted 2-0 and 3-0 silk sutures up and was quite satisfied  with closure after I tested it.  I closed the mesenteric defect with  interrupted silk sutures.  The anastomosis felt nice and large.  I checked  the right gutter and found no evidence of bleeding there.  The sponge,  needle and instrument counts were correct.  I closed the fascia with running  #1 PDS and closed the skin with staples after irrigating the subcutaneous  tissues.  He tolerated the operation well.      Lebron Conners, M.D.  Electronically Signed     WB/MEDQ  D:  03/14/2006  T:  03/15/2006  Job:  [id number]   cc:   Valetta Mole. Swords, MD  [address] [place]  [place] [address]

## 2010-12-18 NOTE — Discharge Summary (Signed)
NAME:  OMARIO, ANDER NO.:  [id number]   MEDICAL RECORD NO.:  [id number]          PATIENT TYPE:  INP   LOCATION:  1436                         FACILITY[facility]  [facility]   PHYSICIAN:  Clovis Pu. Cornett, M.D.DATE OF BIRTH:  [birth year]   DATE OF ADMISSION:  09/08/2006  DATE OF DISCHARGE:  09/11/2006                               DISCHARGE SUMMARY   ADMISSION DIAGNOSES:  1. Incisional hernia.  2. History of hypertension, essential.  3. History of coronary artery disease.  4. History of mucinous adenocarcinoma of the appendix, status post      right hemicolectomy.   DISCHARGE DIAGNOSES:  1. Incisional hernia.  2. History of hypertension, essential.  3. History of coronary artery disease.  4. History of mucinous adenocarcinoma of the appendix, status post      right hemicolectomy.   PROCEDURE PERFORMED:  Laparoscopic ventral hernia repair with mesh.   BRIEF HISTORY:  Patient is a 60 year old male who approximately one year  ago underwent an exploratory laparotomy and right hemicolectomy by Dr.  Orson Slick for mucinous adenocarcinoma of the appendix.  He has an  incisional hernia and presents to have this repaired.   HOSPITAL COURSE:  The patient underwent a laparoscopic ventral hernia  repair on September 08, 2006.  His postoperative course is unremarkable.  He had an ileus for the first two days, and this resolved by  postoperative day #3, and he was discharged.   CONDITION ON DISCHARGE:  Improved.   DISCHARGE INSTRUCTIONS:  The patient will go home on Vicodin ES 1-2 tabs  p.o. q.4h. p.r.n. pain and Protonix 40 mg every day for reflux.  He will  resume lisinopril, hydrochlorothiazide 20/12.5 mg daily, and Crestor 20  mg daily.  He will wear an abdominal binder, refrain from heavy lifting  until he sees me back in the clinic.  He will resume his preop diet.      Thomas A. Cornett, M.D.  Electronically Signed     TAC/MEDQ  D:  10/18/2006  T:  10/18/2006  Job:   [id number]

## 2010-12-18 NOTE — Discharge Summary (Signed)
NAME:  Shawn Anderson, Shawn Anderson NO.:  [id number]   MEDICAL RECORD NO.:  [id number]          PATIENT TYPE:  INP   LOCATION:  1431                         FACILITY:  [facility]   PHYSICIAN:  Lebron Conners, M.D.   DATE OF BIRTH:  Jun 08, 1951   DATE OF ADMISSION:  03/14/2006  DATE OF DISCHARGE:  03/20/2006                                 DISCHARGE SUMMARY   HISTORY OF PRESENT ILLNESS:  This is a previously healthy 60 year old white  male with recent increasing right lower quadrant abdominal pain.  Seen in  the office by Dr. Lovell Sheehan.  He found right lower quadrant tenderness and CT  scan was obtained showing acute appendicitis but some lymphadenopathy in the  local mesentery and a tumor effect at the base of the appendix in the cecum.  He was referred to me for care.   PAST MEDICAL HISTORY:  Significant for a coronary bypass 10 years ago  following an acute MI.  He has had some oral surgery otherwise.   MEDICINES:  Crestor, hydrochlorothiazide, lisinopril and aspirin.   ALLERGIES:  NO MEDICINE ALLERGIES.  The patient does not smoke.  He drinks  alcoholic beverages in moderation.   PHYSICAL EXAM:  A large man slightly overweight.  Exam was unremarkable  except for well-healed median sternotomy and for tenderness in the right  lower quadrant of the bath.   HOSPITAL COURSE:  As soon as arrangements can be made, I took the patient to  the operating room and explored him through an open incision.  Findings an  obvious tumor mass in the cecum, I did a right colectomy with anastomosis of  the ileum to the proximal transverse colon.  He did well following the  surgery and suffered no significant complications.  By the date of  discharge, which was the sixth postoperative day, he was eating a general  diet and incision looked good and he was discharged.  Arrangements were made  for short-term follow-up in the office.  Pathology showed a low grade  mucinous neoplasm of the appendix  producing obstruction and appendicitis.  There was no evidence of any lymphatic or distant metastatic disease and the  tumor had not perforated the serosa.  The patient's prognosis is felt to be  good.   DIAGNOSIS:  1. 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  2. Coronary arteriosclerosis status post coronary artery bypass grafting.  3. Hypertension.  4. Ileal colectomy with anastomosis.   DISCHARGE CONDITION:  Improved.     Lebron Conners, M.D.  Electronically Signed    WB/MEDQ  D:  03/28/2006  T:  03/28/2006  Job:  [id number]

## 2010-12-18 NOTE — Op Note (Signed)
NAME:  Shawn Anderson, Shawn Anderson NO.:  [id number]   MEDICAL RECORD NO.:  [id number]          PATIENT TYPE:  OIB   LOCATION:  0098                         FACILITY:  [facility]   PHYSICIAN:  Clovis Pu. Cornett, M.D.DATE OF BIRTH:  [birth year]   DATE OF PROCEDURE:  09/08/2006  DATE OF DISCHARGE:                               OPERATIVE REPORT   PREOPERATIVE DIAGNOSIS:  Incisional hernia.   POSTOPERATIVE DIAGNOSIS:  Incisional hernia.   PROCEDURE:  Laparoscopic repair of incisional hernia with mesh.   SURGEON:  Dr. Harriette Bouillon.   ASSISTANT:  Dr. Darnell Level.   ANESTHESIA:  General endotracheal anesthesia with 20 mL of 0.25%  Sensorcaine.   ESTIMATED BLOOD LOSS:  100 mL.   DRAINS:  None.   INDICATIONS FOR PROCEDURE:  The patient is a 60 year old male 1 year  status post right hemicolectomy.  He was found to have an incisional  hernia when I saw him in the office.  This was giving him symptoms and  he wished to have this repaired.  I recommended laparoscopic approach to  him.  The hernia was reducible.   I received informed consent from the patient.  Complications of  bleeding, infection, bowel injury as well as recurrence were discussed.  He understood the above and wished to proceed at this point in time.   DESCRIPTION OF PROCEDURE:  The patient was brought to the operating room  and placed supine.  After induction of general endotracheal anesthesia,  the abdomen was prepped and draped in a sterile fashion.  A Foley  catheter was placed,  received preoperative antibiotics.  A 10 mm  Optiview was used and this was inserted through an incision in his left  upper quadrant.  We were able to insert this in the abdominal cavity and  create a pneumoperitoneum without injury to the bowel.  Once the  pneumoperitoneum was created, we placed two 5 mm ports, one in the right  upper quadrant and a second in the right lower quadrant.  The 5-mm scope  was then used.  I was able to  inspect the abdominal cavity.  There were  some adhesions to a centrally located hernia where his incision was in  his mid abdomen just above his umbilicus.  I placed a left lower  quadrant incision for a 5-mm port under direct vision.  Next adhesions  were taken down using the scissors and cautery to some loose omental  fasteners to the hernia.  Once we did this, we measured the hernia  defect to be roughly 10 x 10 cm in its maximal diameter.  We used a 15 x  20 cm piece of Parietex dual mesh.  Eight sutures were placed  circumferentially around this and this was marked according to the skin  and the mesh.  We placed the mesh in saline and then rolled it up and  put through the 10 mm port.  We were able to spread this out.  We then  used a suture passer to pull up each suture to its corresponding point  in the abdominal wall under  direct vision.  Once we did this, it lay up  quite nicely with no pleating. There was some bleeding from the right  rectus muscle with the insertion of the suture passer.  We placed two  additional Novofil sutures to the mesh into the abdominal wall at the  site with good cessation of bleeding.  We placed an additional Novofil  as well through the subxiphoid placed suture as well.  We then used a  tacking device to tack the edges of the mesh circumferentially using two  rows of tacks.  There was probably 100 mL of blood loss from the rectus.  We then carefully watched this over at least 20 minutes.  There was no  bleeding from this area and no accumulation of hematoma in the right  rectus sheath so we felt our sutures were adequate at controlling  hemostasis.  Irrigation was used and this was suctioned out.  The bowel  was inspected and there was no evidence of any bowel injury.  The liver  and stomach were also uninjured it appeared.  At this point in time, all  excess irrigation was suctioned out.  I then examined the abdominal  contents and saw no signs of  bowel injury.  At this point in time, the  mesh laid very nicely and covered the defect with a least 3 cm of  overlap it looked like.  We then released the CO2 at this point and let  the abdominal cavity collapse down.  All ports were removed as well.  We  closed the incision with skin staples.  The 10 mm port was covered by  the mesh.  At this point in time, I inspected the abdominal wall and  felt no accumulating hematoma especially in the right rectus sheath.  Sterile dressings were then applied.  All final counts of sponge, needle  and instruments were found to be correct at this portion of the case.  The patient was then awoke, taken to recovery in satisfactory condition.      Thomas A. Cornett, M.D.  Electronically Signed     TAC/MEDQ  D:  09/08/2006  T:  09/09/2006  Job:  [id number]   cc:   Valetta Mole. Swords, MD  [address] [place]  [place] [address]   Stacie Glaze, MD  [address] [place]  [place] [address]

## 2010-12-18 NOTE — H&P (Signed)
NAME:  Shawn Anderson, JEWKES NO.:  [id number]   MEDICAL RECORD NO.:  [id number]          PATIENT TYPE:  INP   LOCATION:  5409                         FACILITY:  [facility]   PHYSICIAN:  Lebron Conners, M.D.   DATE OF BIRTH:  September 09, 1950   DATE OF ADMISSION:  03/14/2006  DATE OF DISCHARGE:                                HISTORY & PHYSICAL   CHIEF COMPLAINT:  Abdominal  pain.   PRESENTING PROBLEM:  The patient is a generally healthy 60 year old white  male who has had several day history of increasing right lower quadrant  abdominal  pain.  Dr. Lovell Sheehan saw him in the office today and found right  lower quadrant tenderness.  He ordered a CT scan of the abdomen which showed  acute appendicitis but in addition showed some lymphadenopathy and a tumor  effect at the base of the appendix in the cecum.  There is concern that he  might have a mucinous tumor obstructing his appendix.  The patient is  referred to my care.  There has been no weight loss or fever.  His white  count was normal.  He has no chronic GI symptoms.  He never had colonoscopy.   PAST MEDICAL HISTORY:  He has had a CABG about 10 years ago.  His only other  operation was oral surgery.  He says that the CABG was related to extreme  exertion.  It was thought that he broke a plaque loose and caused a small  infarct at the tip of the heart.  He had a complete recovery and now has no  chest pain, shortness of breath or palpitations or other heart problems.  He  has high blood pressure, no history of diabetes.   MEDICATIONS:  He is on Crestor, hydrochlorothiazide, lisinopril and aspirin.   ALLERGIES:  NO MEDICINE ALLERGIES.   SOCIAL HISTORY:  He does not smoke cigarettes or use tobacco otherwise.  He  drinks alcoholic beverages in moderation.   FAMILY HISTORY:  Unremarkable.   REVIEW OF SYSTEMS:  He had some congestion several months back, thought  possibly due to some medicines.  They got completely over that.  He  denies  all serious signs of illness.   PHYSICAL EXAMINATION:  GENERAL:  Patient is in no acute distress.  VITAL SIGNS:  Unremarkable as recorded by nursing staff.  HEAD AND NECK:  Unremarkable with no enlargement of the thyroid, no masses,  no mucosal lesions.  CHEST:  Clear to auscultation.  Well-healed median sternotomy scar.  HEART:  Rate and rhythm normal, no murmur or gallop noted.  ABDOMEN:  Soft.  Bowel sounds present.  Marked tenderness in the right lower  quadrant, otherwise unremarkable.  No mass.  EXTREMITIES:  Normal.  There is a venectomy incision on the left lower  extremity.  No edema, good pulses.  No skin lesions.  NEUROLOGICAL:  Grossly normal.  LYMPH NODES:  Not enlarged in the neck, groin or axillae.   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   PLAN:  Limited right colectomy.  Lebron Conners, M.D.  Electronically Signed     WB/MEDQ  D:  03/14/2006  T:  03/14/2006  Job:  [id number]

## 2011-02-08 ENCOUNTER — Other Ambulatory Visit: Payer: Self-pay | Admitting: Internal Medicine

## 2011-02-11 ENCOUNTER — Other Ambulatory Visit: Payer: Self-pay | Admitting: *Deleted

## 2011-02-11 NOTE — Telephone Encounter (Signed)
Pharmacy called and pt is requesting a new rx for voltaren gel

## 2011-02-11 NOTE — Telephone Encounter (Signed)
ok 

## 2011-02-12 MED ORDER — DICLOFENAC SODIUM 1 % TD GEL: 1.0000 "application " | Freq: Four times a day (QID) | TRANSDERMAL | Status: DC

## 2011-02-12 NOTE — Telephone Encounter (Signed)
rx sent in electronically 

## 2011-07-01 ENCOUNTER — Telehealth: Payer: Self-pay | Admitting: Internal Medicine

## 2011-07-01 NOTE — Telephone Encounter (Signed)
Pt need cpx before 08-02-11 due to ins.

## 2011-07-01 NOTE — Telephone Encounter (Signed)
Ok to work in.

## 2011-07-02 NOTE — Telephone Encounter (Signed)
What day do you want to put him in at 7:45

## 2011-07-07 NOTE — Telephone Encounter (Signed)
Pt is sch for 12-10 745am

## 2011-07-07 NOTE — Telephone Encounter (Signed)
lmom at wk# for pt to callback

## 2011-07-07 NOTE — Telephone Encounter (Signed)
See me 745 monday

## 2011-07-12 ENCOUNTER — Encounter: Payer: Self-pay | Admitting: Internal Medicine

## 2011-07-12 ENCOUNTER — Ambulatory Visit (INDEPENDENT_AMBULATORY_CARE_PROVIDER_SITE_OTHER): Payer: BC Managed Care – PPO | Admitting: Internal Medicine

## 2011-07-12 VITALS — BP 124/84 | HR 76 | Temp 98.1°F | Ht 73.5 in | Wt 262.0 lb

## 2011-07-12 DIAGNOSIS — Z0000 Encounter for general adult medical examination without abnormal findings: Secondary | ICD-10-CM

## 2011-07-12 LAB — CBC WITH DIFFERENTIAL/PLATELET
Basophils Absolute: 0 10*3/uL (ref 0.0–0.1)
Basophils Relative: 0.5 % (ref 0.0–3.0)
Eosinophils Absolute: 0.1 10*3/uL (ref 0.0–0.7)
HCT: 40 % (ref 39.0–52.0)
Hemoglobin: 13.7 g/dL (ref 13.0–17.0)
Lymphs Abs: 2.2 10*3/uL (ref 0.7–4.0)
MCHC: 34.1 g/dL (ref 30.0–36.0)
Neutro Abs: 2.9 10*3/uL (ref 1.4–7.7)
RDW: 14.1 % (ref 11.5–14.6)

## 2011-07-12 LAB — BASIC METABOLIC PANEL
GFR: 76.51 mL/min (ref >60.00)
Glucose, Bld: 117 mg/dL — ABNORMAL HIGH (ref 70–99)
Potassium: 5.2 mEq/L — ABNORMAL HIGH (ref 3.5–5.1)
Sodium: 137 mEq/L (ref 135–145)

## 2011-07-12 LAB — POCT URINALYSIS DIPSTICK
Bilirubin, UA: NEGATIVE
Ketones, UA: NEGATIVE
Leukocytes, UA: NEGATIVE
Nitrite, UA: NEGATIVE
pH, UA: 5.5

## 2011-07-12 LAB — HEPATIC FUNCTION PANEL
AST: 41 U/L — ABNORMAL HIGH (ref 0–37)
Alkaline Phosphatase: 63 U/L (ref 39–117)
Bilirubin, Direct: 0.1 mg/dL (ref 0.0–0.3)
Total Bilirubin: 0.7 mg/dL (ref 0.3–1.2)

## 2011-07-12 LAB — PSA: PSA: 0.55 ng/mL (ref 0.10–4.00)

## 2011-07-12 NOTE — Progress Notes (Signed)
Patient ID: Shawn Anderson, male   DOB: [birth year], 60 y.o.   MRN: [id number] cpx  Past Medical History  Diagnosis Date  . CAD (coronary artery disease)   . Hyperlipidemia   . Hypertension     History   Social History  . Marital Status: Married    Spouse Name: N/A    Number of Children: N/A  . Years of Education: N/A   Occupational History  . Not on file.   Social History Main Topics  . Smoking status: Never Smoker   . Smokeless tobacco: Not on file  . Alcohol Use: Yes  . Drug Use: No  . Sexually Active: Not on file   Other Topics Concern  . Not on file   Social History Narrative  . No narrative on file    Past Surgical History  Procedure Date  . Coronary artery bypass graft   . Tonsillectomy   . Appendeceal tumor     malignant  . Hernia repair     Family History  Problem Relation Age of Onset  . Stroke Mother   . Heart disease Mother   . Aneurysm Father     No Known Allergies  Current Outpatient Prescriptions on File Prior to Visit  Medication Sig Dispense Refill  . CRESTOR 20 MG tablet take 1 tablet by mouth once daily  30 tablet  4  . diclofenac sodium (VOLTAREN) 1 % GEL Apply 1 application topically 4 (four) times daily.  100 g  5  . lisinopril (PRINIVIL,ZESTRIL) 40 MG tablet take 1 tablet by mouth once daily  90 tablet  3     patient denies chest pain, shortness of breath, orthopnea. Denies lower extremity edema, abdominal pain, change in appetite, change in bowel movements. Patient denies rashes, musculoskeletal complaints. No other specific complaints in a complete review of systems.   BP 124/84  Pulse 76  Temp(Src) 98.1 F (36.7 C) (Oral)  Ht 6' 1.5" (1.867 m)  Wt 262 lb (118.842 kg)  BMI 34.10 kg/m2  Well-developed male in no acute distress. HEENT exam atraumatic, normocephalic, extraocular muscles are intact. Conjunctivae are pink without exudate. Neck is supple without lymphadenopathy, thyromegaly, jugular venous distention. Chest is  clear to auscultation without increased work of breathing. Cardiac exam S1-S2 are regular. The PMI is normal. No significant murmurs or gallops. Abdominal exam active bowel sounds, soft, nontender. No abdominal bruits. Extremities no clubbing cyanosis or edema. Peripheral pulses are normal without bruits. Neurologic exam alert and oriented without any motor or sensory deficits. Rectal exam normal tone prostate normal size without masses or asymmetry.  Well Visit:  Health maint UTD

## 2011-07-15 LAB — LIPID PANEL
Cholesterol: 196 mg/dL (ref 0–200)
VLDL: 30.2 mg/dL (ref 0.0–40.0)

## 2011-07-19 NOTE — Progress Notes (Signed)
Quick Note:  Left message to call back. ______ 

## 2011-07-23 ENCOUNTER — Encounter: Payer: Self-pay | Admitting: *Deleted

## 2011-08-09 ENCOUNTER — Other Ambulatory Visit: Payer: Self-pay | Admitting: Internal Medicine

## 2011-08-10 ENCOUNTER — Other Ambulatory Visit: Payer: Self-pay | Admitting: *Deleted

## 2011-08-10 MED ORDER — ROSUVASTATIN CALCIUM 40 MG PO TABS: 40.0000 mg | ORAL_TABLET | Freq: Every day | ORAL | Status: DC

## 2011-12-06 ENCOUNTER — Other Ambulatory Visit: Payer: Self-pay | Admitting: Internal Medicine

## 2012-02-09 ENCOUNTER — Other Ambulatory Visit: Payer: Self-pay | Admitting: Internal Medicine

## 2012-02-10 ENCOUNTER — Telehealth: Payer: Self-pay | Admitting: Internal Medicine

## 2012-02-10 MED ORDER — NAPROXEN 375 MG PO TABS: 375.0000 mg | ORAL_TABLET | Freq: Two times a day (BID) | ORAL | Status: DC

## 2012-02-10 NOTE — Telephone Encounter (Signed)
rx sent in electronically, pt aware 

## 2012-02-10 NOTE — Telephone Encounter (Signed)
Naproxen 375 mg po bid prn gout flare. Take with food #30/1 refill

## 2012-02-10 NOTE — Telephone Encounter (Signed)
Per Dr Cato Mulligan pt should be fine, prednisone is low dose. Dr Perlie Mayo gave pt Naproxen and wants to know if Dr Cato Mulligan could fill it [facility] 375 mg once daily

## 2012-02-10 NOTE — Telephone Encounter (Signed)
Caller: Randolf/Patient is calling with a question about Prednisone.The medication was written by Birdie Sons. Pt states that a rx for Prednisone was sent in yesterday. he needs clarification on instructions. It says take 3 a day for 5 days and then as needed for gout flare. Pt wants to know if he is supposed to be weaned off the medicine, because last time he took it this way he had side effects coming off of it. He also wants to know if he can take the rx for Naproxen he has at home along with the Prednisone. If so, he would like a refill because he is almost out of this.

## 2012-07-02 ENCOUNTER — Other Ambulatory Visit: Payer: Self-pay | Admitting: Internal Medicine

## 2012-07-11 ENCOUNTER — Telehealth: Payer: Self-pay | Admitting: Internal Medicine

## 2012-07-11 NOTE — Telephone Encounter (Signed)
Pt needs CPX done by 08/16/12 for insurance reasons.(cost will go up) Can we work in somewhere? Pt does not want another provider unless necessary.

## 2012-07-12 NOTE — Telephone Encounter (Signed)
LMOM

## 2012-07-12 NOTE — Telephone Encounter (Signed)
Have him see Shawn Anderson or dr. Selena Batten

## 2012-07-13 NOTE — Telephone Encounter (Signed)
Is it ok to schedule CPX w/ you before Jan 15,2014 for this pt? Dr Cato Mulligan said OK w/ him.

## 2012-07-14 NOTE — Telephone Encounter (Signed)
ok 

## 2012-07-14 NOTE — Telephone Encounter (Signed)
Pt states he would prefer a male. Is it ok to schedule w/ Dr Artist Pais? Is it ok w/ you, Dr Artist Pais?

## 2012-07-16 NOTE — Telephone Encounter (Signed)
Ok with me 

## 2012-07-19 NOTE — Telephone Encounter (Signed)
Appt made

## 2012-08-02 HISTORY — PX: JOINT REPLACEMENT: SHX530

## 2012-08-03 ENCOUNTER — Ambulatory Visit (INDEPENDENT_AMBULATORY_CARE_PROVIDER_SITE_OTHER): Payer: 59 | Admitting: Internal Medicine

## 2012-08-03 ENCOUNTER — Encounter: Payer: Self-pay | Admitting: Internal Medicine

## 2012-08-03 VITALS — BP 150/92 | HR 84 | Temp 98.0°F | Ht 73.0 in | Wt 263.0 lb

## 2012-08-03 DIAGNOSIS — I519 Heart disease, unspecified: Secondary | ICD-10-CM

## 2012-08-03 DIAGNOSIS — Z0000 Encounter for general adult medical examination without abnormal findings: Secondary | ICD-10-CM

## 2012-08-03 DIAGNOSIS — Z125 Encounter for screening for malignant neoplasm of prostate: Secondary | ICD-10-CM

## 2012-08-03 DIAGNOSIS — M109 Gout, unspecified: Secondary | ICD-10-CM

## 2012-08-03 DIAGNOSIS — I2510 Atherosclerotic heart disease of native coronary artery without angina pectoris: Secondary | ICD-10-CM

## 2012-08-03 DIAGNOSIS — E785 Hyperlipidemia, unspecified: Secondary | ICD-10-CM

## 2012-08-03 DIAGNOSIS — R7401 Elevation of levels of liver transaminase levels: Secondary | ICD-10-CM

## 2012-08-03 DIAGNOSIS — L570 Actinic keratosis: Secondary | ICD-10-CM | POA: Insufficient documentation

## 2012-08-03 DIAGNOSIS — R7402 Elevation of levels of lactic acid dehydrogenase (LDH): Secondary | ICD-10-CM

## 2012-08-03 DIAGNOSIS — I10 Essential (primary) hypertension: Secondary | ICD-10-CM

## 2012-08-03 LAB — CBC WITH DIFFERENTIAL/PLATELET
Basophils Relative: 0.5 % (ref 0.0–3.0)
Eosinophils Relative: 1.3 % (ref 0.0–5.0)
Lymphocytes Relative: 34.3 % (ref 12.0–46.0)
Monocytes Relative: 9.4 % (ref 3.0–12.0)
Neutrophils Relative %: 54.5 % (ref 43.0–77.0)
Platelets: 220 10*3/uL (ref 150.0–400.0)
RBC: 4.23 Mil/uL (ref 4.22–5.81)
WBC: 6.2 10*3/uL (ref 4.5–10.5)

## 2012-08-03 LAB — TSH: TSH: 1.99 u[IU]/mL (ref 0.35–5.50)

## 2012-08-03 LAB — BASIC METABOLIC PANEL
BUN: 18 mg/dL (ref 6–23)
Chloride: 100 mEq/L (ref 96–112)
Creatinine, Ser: 1 mg/dL (ref 0.4–1.5)
GFR: 84.55 mL/min (ref >60.00)

## 2012-08-03 LAB — HEPATIC FUNCTION PANEL
ALT: 69 U/L — ABNORMAL HIGH (ref 0–53)
AST: 68 U/L — ABNORMAL HIGH (ref 0–37)
Alkaline Phosphatase: 62 U/L (ref 39–117)
Bilirubin, Direct: 0 mg/dL (ref 0.0–0.3)
Total Bilirubin: 1 mg/dL (ref 0.3–1.2)
Total Protein: 8 g/dL (ref 6.0–8.3)

## 2012-08-03 LAB — IBC PANEL: Saturation Ratios: 23.2 % (ref 20.0–50.0)

## 2012-08-03 LAB — LIPID PANEL
Total CHOL/HDL Ratio: 4
Triglycerides: 309 mg/dL — ABNORMAL HIGH (ref 0.0–149.0)

## 2012-08-03 LAB — URIC ACID: Uric Acid, Serum: 8.6 mg/dL — ABNORMAL HIGH (ref 4.0–7.8)

## 2012-08-03 MED ORDER — LISINOPRIL 40 MG PO TABS: 40.0000 mg | ORAL_TABLET | Freq: Every day | ORAL | Status: DC

## 2012-08-03 MED ORDER — ROSUVASTATIN CALCIUM 40 MG PO TABS: 40.0000 mg | ORAL_TABLET | Freq: Every day | ORAL | Status: DC

## 2012-08-03 NOTE — Assessment & Plan Note (Signed)
Patient encouraged to followup with cardiology.  Patient plans to make an appointment with his wife's cardiologist.

## 2012-08-03 NOTE — Assessment & Plan Note (Signed)
Blood pressure is elevated. Patient does not monitor his blood pressure at home. Patient advised to keep blood pressure log at home. Stressed importance of adequate blood pressure control, especially considering history of coronary artery disease. Consider combination therapy-ACE inhibitor with amlodipine. He did not tolerate hydrochlorothiazide in the past because it exacerbated his gout.  Monitor electrolytes and kidney function.  We discussed risks of using NSAIDs with ACE inhibitors. BP: 150/92 mmHg

## 2012-08-03 NOTE — Progress Notes (Signed)
Subjective:    Patient ID: Shawn Anderson, male    DOB: 1951/07/20, 62 y.o.   MRN: [id number]  HPI  62 year old white male with history of coronary artery disease, hypertension, hyperlipidemia and gout for routine physical. Patient usually followed by Dr. Cato Mulligan. Patient reports history of CABG x 3 in 1997. It has been 3 years since his last cardiac workup. He reports his last stress test was normal. He also reports normal carotid Doppler 3 years ago.  Patient concerned about overall weight gain over the last 1 to 2 years. Patient reports weight gain after he had ventral hernia repair.  Patient and wife recently joined [employer].  Since gaining weight, he has had more issues with muscle skeletal pain. He was seen by orthopedic physician for right patellar tendinitis and left ankle pain. He takes indomethacin as needed. He has infrequent gout flares.  Patient does not smoke. Patient reports daily alcohol use. He drinks half bottle to one bottle of wine per night.  Review of Systems  Constitutional: Negative for activity change, appetite change and unexpected weight change.  Eyes: Negative for visual disturbance.  Respiratory: Negative for cough, chest tightness and shortness of breath.   Cardiovascular: Negative for chest pain.  Genitourinary: Negative for difficulty urinating.  Neurological: Negative for headaches.  Gastrointestinal: Negative for abdominal pain, heartburn melena or hematochezia Psych: Negative for depression or anxiety Endo:  He denies erectile dysfunction    Past Medical History  Diagnosis Date  . CAD (coronary artery disease)   . Hyperlipidemia   . Hypertension   . Gout   . Obesity     History   Social History  . Marital Status: Married    Spouse Name: N/A    Number of Children: N/A  . Years of Education: N/A   Occupational History  . 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 Smoking status: Never Smoker     . Smokeless tobacco: Not on file  . Alcohol Use: 15.0 oz/week    30 drink(s) per week  . Drug Use: No  . Sexually Active: Not on file   Other Topics Concern  . Not on file   Social History Narrative  . No narrative on file    Past Surgical History  Procedure Date  . Coronary artery bypass graft 1997  . Tonsillectomy   . Appendeceal tumor     malignant  . Hernia repair     Family History  Problem Relation Age of Onset  . Stroke Mother   . Heart disease Mother   . Aneurysm Father     No Known Allergies  Current Outpatient Prescriptions on File Prior to Visit  Medication Sig Dispense Refill  . aspirin 325 MG tablet Take 325 mg by mouth daily.        Marland Kitchen lisinopril (PRINIVIL,ZESTRIL) 40 MG tablet Take 1 tablet (40 mg total) by mouth daily.  90 tablet  1  . Multiple Vitamin (MULTIVITAMIN) tablet Take 1 tablet by mouth daily.        . naproxen (NAPROSYN) 375 MG tablet Take 1 tablet (375 mg total) by mouth 2 (two) times daily with a meal.  30 tablet  1  . predniSONE (DELTASONE) 10 MG tablet take 3 tablets by mouth once daily for 5 days then take if needed for GOUT FLARE  20 tablet  1  . rosuvastatin (CRESTOR) 40 MG tablet Take 1 tablet (40 mg total) by  mouth daily.  90 tablet  1    BP 150/92  Pulse 84  Temp 98 F (36.7 C) (Oral)  Ht 6\' 1"  (1.854 m)  Wt 263 lb (119.296 kg)  BMI 34.70 kg/m2       Objective:   Physical Exam  Constitutional: He appears well-developed and well-nourished.  HENT:  Head: Normocephalic and atraumatic.  Right Ear: External ear normal.  Left Ear: External ear normal.  Mouth/Throat: Oropharynx is clear and moist.  Eyes: EOM are normal. Pupils are equal, round, and reactive to light.  Neck: Normal range of motion. Neck supple.       No carotid bruit  Cardiovascular: Normal rate, regular rhythm, normal heart sounds and intact distal pulses.   Pulmonary/Chest: Effort normal and breath sounds normal. He has no wheezes. He has no rales.   Abdominal: Soft. Bowel sounds are normal. He exhibits no mass. There is no tenderness.       obese  Genitourinary: Rectum normal, prostate normal and penis normal. Guaiac negative stool.  Musculoskeletal:       Trace lower extremity edema bilaterally  Lymphadenopathy:    He has no cervical adenopathy.  Skin: Skin is warm and dry.       Scattered scaly lesions on bilateral forearms  Psychiatric: He has a normal mood and affect. His behavior is normal.          Assessment & Plan:

## 2012-08-03 NOTE — Assessment & Plan Note (Addendum)
Reviewed adult health maintenance protocols.  Weight loss encouraged. Patient advised to decrease alcohol use. Patient plans to schedule colonoscopy on his own.  DRE normal.  Monitor PSA.

## 2012-08-03 NOTE — Assessment & Plan Note (Signed)
Patient has numerous spots on bilateral forearms. Refer to dermatology for further evaluation and treatment.

## 2012-08-03 NOTE — Assessment & Plan Note (Signed)
Maintain Crestor.

## 2012-08-03 NOTE — Patient Instructions (Signed)
Monitor your blood pressure as directed

## 2012-08-04 LAB — HEPATITIS B SURFACE ANTIGEN: Hepatitis B Surface Ag: NEGATIVE

## 2012-09-14 ENCOUNTER — Ambulatory Visit: Payer: Self-pay | Admitting: Internal Medicine

## 2012-11-14 ENCOUNTER — Ambulatory Visit: Payer: 59 | Admitting: Cardiovascular Disease

## 2012-12-19 ENCOUNTER — Encounter: Payer: Self-pay | Admitting: Internal Medicine

## 2012-12-19 ENCOUNTER — Ambulatory Visit (INDEPENDENT_AMBULATORY_CARE_PROVIDER_SITE_OTHER): Payer: 59 | Admitting: Internal Medicine

## 2012-12-19 VITALS — BP 140/92 | HR 76 | Temp 98.2°F | Wt 254.0 lb

## 2012-12-19 DIAGNOSIS — M109 Gout, unspecified: Secondary | ICD-10-CM

## 2012-12-19 DIAGNOSIS — M79609 Pain in unspecified limb: Secondary | ICD-10-CM

## 2012-12-19 DIAGNOSIS — M79672 Pain in left foot: Secondary | ICD-10-CM | POA: Insufficient documentation

## 2012-12-19 DIAGNOSIS — K760 Fatty (change of) liver, not elsewhere classified: Secondary | ICD-10-CM | POA: Insufficient documentation

## 2012-12-19 DIAGNOSIS — R1031 Right lower quadrant pain: Secondary | ICD-10-CM | POA: Insufficient documentation

## 2012-12-19 DIAGNOSIS — R7401 Elevation of levels of liver transaminase levels: Secondary | ICD-10-CM

## 2012-12-19 LAB — BASIC METABOLIC PANEL
BUN: 20 mg/dL (ref 6–23)
Chloride: 99 mEq/L (ref 96–112)
Glucose, Bld: 91 mg/dL (ref 70–99)
Potassium: 4.2 mEq/L (ref 3.5–5.1)
Sodium: 135 mEq/L (ref 135–145)

## 2012-12-19 LAB — HEPATIC FUNCTION PANEL
AST: 25 U/L (ref 0–37)
Albumin: 4.3 g/dL (ref 3.5–5.2)
Alkaline Phosphatase: 54 U/L (ref 39–117)
Bilirubin, Direct: 0.1 mg/dL (ref 0.0–0.3)
Total Protein: 7.9 g/dL (ref 6.0–8.3)

## 2012-12-19 MED ORDER — COLCHICINE 0.6 MG PO TABS: 0.6000 mg | ORAL_TABLET | Freq: Every day | ORAL | Status: DC

## 2012-12-19 NOTE — Patient Instructions (Addendum)
Take 1/2 dose of Crestor when you take colchicine Goal weight loss 10-15 lbs before next office visit.

## 2012-12-19 NOTE — Assessment & Plan Note (Addendum)
Patient has chronic mild elevations in transaminases. I reviewed CT of abdomen and pelvis in 2008. It showed fatty infiltration of the liver. He also has chronic alcohol use. Patient advised to continue to decrease alcohol consumption. Also work on weight loss. Monitor LFTs. We discussed risk of steatohepatitis.

## 2012-12-19 NOTE — Progress Notes (Signed)
Subjective:    Patient ID: Shawn Anderson, male    DOB: 1950-08-22, 62 y.o.   MRN: [id number]  HPI  62 year old white male with history of coronary artery disease, hypertension hyperlipidemia and gout for followup.  Patient has lost approximately 10 pounds since previous visit. He is making a conscious effort to decrease his portions.  He is having intermittent pain in his left ankle and foot. He is not sure whether symptoms are secondary to gout versus osteoarthritis in his foot. He has been using indomethacin or over-the-counter NSAIDs.  He has decreased his alcohol use.  We reviewed his liver enzymes. He has mild elevations in transaminases. He was negative for hepatitis B and hepatitis C. His iron saturations did not suggest hemochromatosis.   Review of Systems He has intermittent right inguinal pain. Symptoms worse with certain positions. He is unclear whether pain coming from right hip joint    Past Medical History  Diagnosis Date  . CAD (coronary artery disease)   . Hyperlipidemia   . Hypertension   . Gout   . Obesity     History   Social History  . Marital Status: Married    Spouse Name: N/A    Number of Children: N/A  . Years of Education: N/A   Occupational History  . 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 Smoking status: Never Smoker   . Smokeless tobacco: Not on file  . Alcohol Use: 15.0 oz/week    30 drink(s) per week  . Drug Use: No  . Sexually Active: Not on file   Other Topics Concern  . Not on file   Social History Narrative  . No narrative on file    Past Surgical History  Procedure Laterality Date  . Coronary artery bypass graft  1997  . Tonsillectomy    . Appendeceal tumor      malignant  . Hernia repair      Family History  Problem Relation Age of Onset  . Stroke Mother   . Heart disease Mother   . Aneurysm Father     No Known Allergies  Current Outpatient Prescriptions on File Prior  to Visit  Medication Sig Dispense Refill  . lisinopril (PRINIVIL,ZESTRIL) 40 MG tablet Take 1 tablet (40 mg total) by mouth daily.  90 tablet  1  . Multiple Vitamin (MULTIVITAMIN) tablet Take 1 tablet by mouth daily.        . rosuvastatin (CRESTOR) 40 MG tablet Take 1 tablet (40 mg total) by mouth daily.  90 tablet  1   No current facility-administered medications on file prior to visit.    BP 140/92  Pulse 76  Temp(Src) 98.2 F (36.8 C) (Oral)  Wt 254 lb (115.214 kg)  BMI 33.52 kg/m2    Objective:   Physical Exam  Constitutional: He appears well-developed and well-nourished.  HENT:  Head: Normocephalic and atraumatic.  Eyes: Pupils are equal, round, and reactive to light. No scleral icterus.  Cardiovascular: Normal rate, regular rhythm and normal heart sounds.   No murmur heard. Pulmonary/Chest: Effort normal and breath sounds normal. He has no wheezes.  Abdominal: Soft. Bowel sounds are normal.  No obvious inguinal hernia  Musculoskeletal: Normal range of motion.  No right hip pain with internal or external rotation of  hip  Skin: Skin is warm and dry.  Psychiatric: He has a normal mood and affect.  Assessment & Plan:

## 2012-12-19 NOTE — Assessment & Plan Note (Signed)
Unclear whether patient's left foot pain from gout flare versus osteoarthritis. He discussed risk of using chronic NSAIDs especially while taking ACE inhibitor. Discontinue indomethacin. Trial of colchicine 0.6 mg once daily as needed. Check uric acid level. If uric acid levels elevated, we discussed possibly starting allopurinol. Consider obtaining x-ray of left foot.

## 2012-12-19 NOTE — Assessment & Plan Note (Signed)
Patient has unexplained right inguinal discomfort. Symptoms worse with swimming golf club. I doubt symptoms from degenerative joint disease of right hip. Trial of physical therapy/stretching exercises.  He also has history of previous abdominal surgery requiring mesh repair. Unclear if his abdominal/inguinal symptoms secondary to adhesions.

## 2012-12-20 ENCOUNTER — Ambulatory Visit (INDEPENDENT_AMBULATORY_CARE_PROVIDER_SITE_OTHER): Payer: 59 | Admitting: Cardiovascular Disease

## 2012-12-20 ENCOUNTER — Encounter: Payer: Self-pay | Admitting: Cardiovascular Disease

## 2012-12-20 VITALS — BP 134/84 | HR 76 | Ht 71.0 in | Wt 255.0 lb

## 2012-12-20 DIAGNOSIS — M109 Gout, unspecified: Secondary | ICD-10-CM

## 2012-12-20 DIAGNOSIS — I2510 Atherosclerotic heart disease of native coronary artery without angina pectoris: Secondary | ICD-10-CM

## 2012-12-20 DIAGNOSIS — I10 Essential (primary) hypertension: Secondary | ICD-10-CM

## 2012-12-20 DIAGNOSIS — E785 Hyperlipidemia, unspecified: Secondary | ICD-10-CM

## 2012-12-20 MED ORDER — AMLODIPINE BESYLATE 5 MG PO TABS: 5.0000 mg | ORAL_TABLET | Freq: Every day | ORAL | Status: DC

## 2012-12-20 MED ORDER — LOSARTAN POTASSIUM 100 MG PO TABS: 100.0000 mg | ORAL_TABLET | Freq: Every day | ORAL | Status: DC

## 2012-12-20 NOTE — Progress Notes (Signed)
Patient ID: Shawn Anderson, male   DOB: July 26, 1951, 62 y.o.   MRN: [id number] 62 yo referred by Dr Shawn Anderson.  Not been followed by cardiollogy.  CABG in 83 by Dr Shawn Anderson.  Was on Shawn Anderson and cutting wood and had sudden onset of crushing pain. Indicates going directly to lab and OR with one artery totally blocked and high grade disease in other vessels.  No chest pain.  LVEF is reported as normal.  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  Has lost some weight with weight watchers but still heavy Drinks two glasses of wine/night. Gout flairs made worse by diuretics in past.  BP not ideally controlled. Activity hampered lately by right ? Hip flexor Going to see Shawn Anderson.  LDL low on statin.    ROS: Denies fever, malais, weight loss, blurry vision, decreased visual acuity, cough, sputum, SOB, hemoptysis, pleuritic pain, palpitaitons, heartburn, abdominal pain, melena, lower extremity edema, claudication, or rash.  All other systems reviewed and negative   General: Affect appropriate Healthy:  appears stated age HEENT: normal Neck supple with no adenopathy JVP normal no bruits no thyromegaly Lungs clear with no wheezing and good diaphragmatic motion Heart:  S1/S2 no murmur,rub, gallop or click PMI normal Abdomen: benighn, BS positve, no tenderness, no AAA no bruit.  No HSM or HJR Distal pulses intact with no bruits No edema Neuro non-focal Skin warm and dry No muscular weakness  Medications Current Outpatient Prescriptions  Medication Sig Dispense Refill  . colchicine 0.6 MG tablet Take 1 tablet (0.6 mg total) by mouth daily.  30 tablet  5  . indomethacin (INDOCIN) 50 MG capsule Take 50 mg by mouth as needed.      Marland Kitchen lisinopril (PRINIVIL,ZESTRIL) 40 MG tablet Take 1 tablet (40 mg total) by mouth daily.  90 tablet  1  . Multiple Vitamin (MULTIVITAMIN) tablet Take 1 tablet by mouth daily.        . naproxen (NAPROSYN) 375 MG tablet Take 375 mg by mouth as needed.      . rosuvastatin (CRESTOR)  40 MG tablet Take 1 tablet (40 mg total) by mouth daily.  90 tablet  1  . amLODipine (NORVASC) 5 MG tablet Take 1 tablet (5 mg total) by mouth daily.  30 tablet  11  . losartan (COZAAR) 100 MG tablet Take 1 tablet (100 mg total) by mouth daily.  30 tablet  11   No current facility-administered medications for this visit.    Allergies Review of patient's allergies indicates no known allergies.  Family History: Family History  Problem Relation Age of Onset  . Stroke Mother   . Heart disease Mother   . Aneurysm Father     Social History: History   Social History  . Marital Status: Married    Spouse Name: N/A    Number of Children: N/A  . Years of Education: N/A   Occupational History  . 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 Smoking status: Never Smoker   . Smokeless tobacco: Not on file  . Alcohol Use: 15.0 oz/week    30 drink(s) per week  . Drug Use: No  . Sexually Active: Not on file   Other Topics Concern  . Not on file   Social History Narrative  . No narrative on file    Electrocardiogram: 08/03/12 SR rate 47 old IMI poor R wave progression  Assessment and Plan

## 2012-12-20 NOTE — Assessment & Plan Note (Signed)
D/C lisinopril Would benefit from diuretic but dont want gout to get worse. Try cozaar and amlodipine

## 2012-12-20 NOTE — Assessment & Plan Note (Signed)
Cholesterol is at goal.  Continue current dose of statin and diet Rx.  No myalgias or side effects.  F/U  LFT's in 6 months. Lab Results  Component Value Date   LDLCALC 111* 07/12/2011

## 2012-12-20 NOTE — Assessment & Plan Note (Signed)
Will get CABG records.  Stable with no angina and good activity level.  Continue medical Rx

## 2012-12-20 NOTE — Patient Instructions (Addendum)
Your physician wants you to follow-up in YEAR WITH DR  Haywood Filler will receive a reminder letter in the mail two months in advance. If you don't receive a letter, please call our office to schedule the follow-up appointment. Your physician has recommended you make the following change in your medication: START  LOSARTAN  100 MG EVERY DAY  AND  AMLODIPINE  5 MG  EVERY DAY

## 2012-12-20 NOTE — Assessment & Plan Note (Signed)
Continue indomethicine and colchicine as needed Discussed relation to his daily ETOH.

## 2013-01-04 ENCOUNTER — Telehealth: Payer: Self-pay | Admitting: Internal Medicine

## 2013-01-04 NOTE — Telephone Encounter (Signed)
Received return call from Shawn Anderson and was told the pt was contacted directly to schedule and is set up with Dr. [name] on 8/1. Left detailed message on pts voicemail

## 2013-01-04 NOTE — Telephone Encounter (Signed)
Pt called to follow up on referral to [facility]. He called there this am because he had nor heard from anyone. According to our records, it was faxed 5/21.  They have no record of a referral, and did not receive any fax. Pls advise.

## 2013-01-04 NOTE — Telephone Encounter (Signed)
Referral was faxed over to [facility] ortho on 5/21 to attn Beatrice Lecher who scheduled physical therapy appts(recieved fax confirmation). I called to check the status of this but had to leave a message. Waiting on return call.

## 2013-01-12 ENCOUNTER — Telehealth: Payer: Self-pay | Admitting: Internal Medicine

## 2013-01-12 NOTE — Telephone Encounter (Signed)
Noted  

## 2013-01-12 NOTE — Telephone Encounter (Signed)
FYI pt would like to inform Dr Artist Pais that Dr Eden Emms took him off of lisinopril and prescribed losartan potassium 100 mg and amlodipine besylate 5 mg.

## 2013-01-15 ENCOUNTER — Telehealth: Payer: Self-pay | Admitting: Cardiovascular Disease

## 2013-01-15 NOTE — Telephone Encounter (Signed)
New Problem:    Patient called in wanting to know if it would be ok to take amLODipine (NORVASC) 5 MG tablet, losartan (COZAAR) 100 MG tablet and indomethacin (INDOCIN) 50 MG capsule together.  Please call back, my get a busy signal because his wife is home working, feel free to leave a message.

## 2013-01-15 NOTE — Telephone Encounter (Signed)
Left message for pt that it is ok to take as needed per Dr Fabio Bering note.  Instructed again it should only be taken as needed.  Request he be called back if questions

## 2013-01-23 ENCOUNTER — Telehealth: Payer: Self-pay | Admitting: *Deleted

## 2013-01-23 NOTE — Telephone Encounter (Signed)
SPOKE WITH MEDICAL RECORDS AT [facility]   TO GET OP NOTE FROM CABG DONE IN 1997 BY DR Laneta Simmers

## 2013-01-31 ENCOUNTER — Telehealth: Payer: Self-pay | Admitting: Internal Medicine

## 2013-01-31 ENCOUNTER — Ambulatory Visit: Payer: Self-pay | Admitting: Internal Medicine

## 2013-01-31 NOTE — Telephone Encounter (Signed)
Yes, uric acid level is fine

## 2013-01-31 NOTE — Telephone Encounter (Signed)
lmom for 7/14. Will call and confirm/kh

## 2013-01-31 NOTE — Telephone Encounter (Signed)
Pt has appt w/ Dr Artist Pais that must be resc. Pt was to come in fasting. Is it ok if he comes in early am for those labs and then comes in later date when Dr Artist Pais returns? What labs to schedule. (Labs around 7/14)

## 2013-01-31 NOTE — Telephone Encounter (Signed)
Yes that's fine and per Dr Olegario Messier last note he just needs a uric acid unless Dr Artist Pais wants to add more.  Note sent to Dr Artist Pais for review

## 2013-02-01 NOTE — Telephone Encounter (Signed)
Confirmed appt w/ pt. Pt verbalized understanding. Uric acid labs, no fasting needed.

## 2013-02-05 ENCOUNTER — Encounter: Payer: Self-pay | Admitting: Internal Medicine

## 2013-02-09 ENCOUNTER — Ambulatory Visit: Payer: Self-pay | Admitting: Internal Medicine

## 2013-02-12 ENCOUNTER — Other Ambulatory Visit: Payer: Self-pay

## 2013-02-14 ENCOUNTER — Other Ambulatory Visit: Payer: Self-pay | Admitting: Internal Medicine

## 2013-02-26 ENCOUNTER — Ambulatory Visit (INDEPENDENT_AMBULATORY_CARE_PROVIDER_SITE_OTHER): Payer: 59 | Admitting: Family Medicine

## 2013-02-26 ENCOUNTER — Encounter: Payer: Self-pay | Admitting: Family Medicine

## 2013-02-26 VITALS — BP 120/80 | Temp 98.1°F | Wt 260.0 lb

## 2013-02-26 DIAGNOSIS — L0291 Cutaneous abscess, unspecified: Secondary | ICD-10-CM

## 2013-02-26 DIAGNOSIS — L0390 Cellulitis, unspecified: Secondary | ICD-10-CM

## 2013-02-26 DIAGNOSIS — W57XXXA Bitten or stung by nonvenomous insect and other nonvenomous arthropods, initial encounter: Secondary | ICD-10-CM

## 2013-02-26 DIAGNOSIS — Z23 Encounter for immunization: Secondary | ICD-10-CM

## 2013-02-26 MED ORDER — AMOXICILLIN 875 MG PO TABS: 875.0000 mg | ORAL_TABLET | Freq: Two times a day (BID) | ORAL | Status: DC

## 2013-02-26 MED ORDER — DOXYCYCLINE HYCLATE 100 MG PO TABS: 100.0000 mg | ORAL_TABLET | Freq: Two times a day (BID) | ORAL | Status: DC

## 2013-02-26 NOTE — Addendum Note (Signed)
Addended by: Azucena Freed on: 02/26/2013 11:29 AM   Modules accepted: Orders

## 2013-02-26 NOTE — Patient Instructions (Signed)
-  zyrtec daily  -As we discussed, we have prescribe new medications (doxycycline and amoxicillin) for you at this appointment. We discussed the common and serious potential adverse effects of this medication and you can review these and more with the pharmacist when you pick up your medication.  Please follow the instructions for use carefully and notify us immediately if you have any problems taking this medication.  -can use topical hydrocortisone and calamine if  -follow in 2 days - if worsening or fevers or getting in eye go to the emergency room

## 2013-02-26 NOTE — Progress Notes (Signed)
Chief Complaint  Patient presents with  . Rash    painful and itchy since Wed    HPI:  62 yo m patient of Dr. Olegario Messier here for rash on face: -started: about 4-5 days ago -symptoms: itchy and somewhat painful rash on R side of forehead and temporal area; reports reacts bad to insect bites but doesn't know if something bit him -has gotten sun here - but does't thinks unburn -was one small sore area -has tried dandruff shampoo, neopsporin, hydrocortisone and - getting better -denies: fevers, chills, nausea, vomiting, malaise, rash anywhere else -last tetanus > 5 years ago  ROS: See pertinent positives and negatives per HPI.  Past Medical History  Diagnosis Date  . CAD (coronary artery disease)   . Hyperlipidemia   . Hypertension   . Gout   . Obesity     Family History  Problem Relation Age of Onset  . Stroke Mother   . Heart disease Mother   . Aneurysm Father     History   Social History  . Marital Status: Married    Spouse Name: N/A    Number of Children: N/A  . Years of Education: N/A   Occupational History  . 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 Smoking status: Never Smoker   . Smokeless tobacco: None  . Alcohol Use: 15.0 oz/week    30 drink(s) per week  . Drug Use: No  . Sexually Active: None   Other Topics Concern  . None   Social History Narrative  . None    Current outpatient prescriptions:CRESTOR 40 MG tablet, take 1 tablet by mouth once daily, Disp: 90 tablet, Rfl: 1;  indomethacin (INDOCIN) 50 MG capsule, Take 50 mg by mouth as needed., Disp: , Rfl: ;  lisinopril (PRINIVIL,ZESTRIL) 40 MG tablet, Take 1 tablet (40 mg total) by mouth daily., Disp: 90 tablet, Rfl: 1;  amLODipine (NORVASC) 5 MG tablet, Take 1 tablet (5 mg total) by mouth daily., Disp: 30 tablet, Rfl: 11 amoxicillin (AMOXIL) 875 MG tablet, Take 1 tablet (875 mg total) by mouth 2 (two) times daily., Disp: 14 tablet, Rfl: 0;  colchicine 0.6 MG  tablet, Take 1 tablet (0.6 mg total) by mouth daily., Disp: 30 tablet, Rfl: 5;  doxycycline (VIBRA-TABS) 100 MG tablet, Take 1 tablet (100 mg total) by mouth 2 (two) times daily., Disp: 20 tablet, Rfl: 0;  losartan (COZAAR) 100 MG tablet, Take 1 tablet (100 mg total) by mouth daily., Disp: 30 tablet, Rfl: 11 Multiple Vitamin (MULTIVITAMIN) tablet, Take 1 tablet by mouth daily.  , Disp: , Rfl: ;  naproxen (NAPROSYN) 375 MG tablet, Take 375 mg by mouth as needed., Disp: , Rfl:   EXAM:  Filed Vitals:   02/26/13 1053  BP: 120/80  Temp: 98.1 F (36.7 C)    Body mass index is 36.28 kg/(m^2).  GENERAL: vitals reviewed and listed above, alert, oriented, appears well hydrated and in no acute distress  HEENT: atraumatic, conjunttiva clear, no obvious abnormalities on inspection of external nose and ears, no leasions R ear canal, lips or near or in eye  NECK: no obvious masses on inspection  LUNGS: clear to auscultation bilaterally, no wheezes, rales or rhonchi, good air movement  CV: HRRR, no peripheral edema  SKIN: small erythematous papule R upper forehead with surrounding mild erythema ofver R forehead and temporal region  MS: moves all extremities without noticeable abnormality  PSYCH: pleasant and cooperative, no  obvious depression or anxiety  ASSESSMENT AND PLAN:  Discussed the following assessment and plan:  Cellulitis - Plan: amoxicillin (AMOXIL) 875 MG tablet, doxycycline (VIBRA-TABS) 100 MG tablet  Insect bite  -likely insect bite with allergic reaction; possible mild cellulitis though since reports improvement and no systemic symptoms infection may be less likely -tx per orders and below - risks, return and ED precautions discussed -tdap today -Patient advised to return or notify a doctor immediately if symptoms worsen or persist or new concerns arise.  There are no Patient Instructions on file for this visit.   Kriste Basque R.

## 2013-02-28 ENCOUNTER — Ambulatory Visit (INDEPENDENT_AMBULATORY_CARE_PROVIDER_SITE_OTHER): Payer: 59 | Admitting: Family Medicine

## 2013-02-28 ENCOUNTER — Encounter: Payer: Self-pay | Admitting: Family Medicine

## 2013-02-28 VITALS — BP 118/82 | Temp 98.2°F | Wt 260.0 lb

## 2013-02-28 DIAGNOSIS — L0390 Cellulitis, unspecified: Secondary | ICD-10-CM

## 2013-02-28 DIAGNOSIS — L0291 Cutaneous abscess, unspecified: Secondary | ICD-10-CM

## 2013-02-28 NOTE — Patient Instructions (Signed)
-  continue antibiotics and zyrtec  -follow up in 1 week or go to dermatologist if worsens again or does not continue to improve

## 2013-02-28 NOTE — Progress Notes (Signed)
Chief Complaint  Patient presents with  . Follow-up    HPI:  Follow up insect bite with possible Cellulitis on R temple: -on day 2 of doxy/amox and zyrtec -reports: doing better, less pain and redness and swelling better - itchy a little -denies: fevers, chills, malaise, SOb, swelling of lips or mouth, redness near eyes  ROS: See pertinent positives and negatives per HPI.  Past Medical History  Diagnosis Date  . CAD (coronary artery disease)   . Hyperlipidemia   . Hypertension   . Gout   . Obesity     Family History  Problem Relation Age of Onset  . Stroke Mother   . Heart disease Mother   . Aneurysm Father     History   Social History  . Marital Status: Married    Spouse Name: N/A    Number of Children: N/A  . Years of Education: N/A   Occupational History  . 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 Smoking status: Never Smoker   . Smokeless tobacco: None  . Alcohol Use: 15.0 oz/week    30 drink(s) per week  . Drug Use: No  . Sexually Active: None   Other Topics Concern  . None   Social History Narrative  . None    Current outpatient prescriptions:amLODipine (NORVASC) 5 MG tablet, Take 1 tablet (5 mg total) by mouth daily., Disp: 30 tablet, Rfl: 11;  amoxicillin (AMOXIL) 875 MG tablet, Take 1 tablet (875 mg total) by mouth 2 (two) times daily., Disp: 14 tablet, Rfl: 0;  colchicine 0.6 MG tablet, Take 1 tablet (0.6 mg total) by mouth daily., Disp: 30 tablet, Rfl: 5;  CRESTOR 40 MG tablet, take 1 tablet by mouth once daily, Disp: 90 tablet, Rfl: 1 doxycycline (VIBRA-TABS) 100 MG tablet, Take 1 tablet (100 mg total) by mouth 2 (two) times daily., Disp: 20 tablet, Rfl: 0;  indomethacin (INDOCIN) 50 MG capsule, Take 50 mg by mouth as needed., Disp: , Rfl: ;  lisinopril (PRINIVIL,ZESTRIL) 40 MG tablet, Take 1 tablet (40 mg total) by mouth daily., Disp: 90 tablet, Rfl: 1;  losartan (COZAAR) 100 MG tablet, Take 1 tablet (100  mg total) by mouth daily., Disp: 30 tablet, Rfl: 11 Multiple Vitamin (MULTIVITAMIN) tablet, Take 1 tablet by mouth daily.  , Disp: , Rfl: ;  naproxen (NAPROSYN) 375 MG tablet, Take 375 mg by mouth as needed., Disp: , Rfl:   EXAM:  Filed Vitals:   02/28/13 0906  BP: 118/82  Temp: 98.2 F (36.8 C)    Body mass index is 36.28 kg/(m^2).  GENERAL: vitals reviewed and listed above, alert, oriented, appears well hydrated and in no acute distress  HEENT: atraumatic, conjunttiva clear, no obvious abnormalities on inspection of external nose and ears  NECK: no obvious masses on inspection  SKIN: improved appearance of redness of skin on R temple  MS: moves all extremities without noticeable abnormality  PSYCH: pleasant and cooperative, no obvious depression or anxiety  ASSESSMENT AND PLAN:  Discussed the following assessment and plan:  Cellulitis  -continue abx and zyrtec, follow up in 1 week if continues to improve or if worsening see dermatologist -Patient advised to return or notify a doctor immediately if symptoms worsen or persist or new concerns arise.  There are no Patient Instructions on file for this visit.   Kriste Basque R.

## 2013-03-13 ENCOUNTER — Other Ambulatory Visit (INDEPENDENT_AMBULATORY_CARE_PROVIDER_SITE_OTHER): Payer: 59

## 2013-03-13 DIAGNOSIS — M109 Gout, unspecified: Secondary | ICD-10-CM

## 2013-03-13 LAB — URIC ACID: Uric Acid, Serum: 7.7 mg/dL (ref 4.0–7.8)

## 2013-03-14 ENCOUNTER — Other Ambulatory Visit: Payer: Self-pay | Admitting: Internal Medicine

## 2013-04-04 ENCOUNTER — Ambulatory Visit: Payer: 59 | Admitting: Family Medicine

## 2013-04-04 ENCOUNTER — Ambulatory Visit (INDEPENDENT_AMBULATORY_CARE_PROVIDER_SITE_OTHER): Payer: 59 | Admitting: Family Medicine

## 2013-04-04 ENCOUNTER — Encounter: Payer: Self-pay | Admitting: Family Medicine

## 2013-04-04 VITALS — BP 116/64 | HR 81 | Temp 98.2°F | Wt 268.0 lb

## 2013-04-04 DIAGNOSIS — H1089 Other conjunctivitis: Secondary | ICD-10-CM

## 2013-04-04 DIAGNOSIS — A499 Bacterial infection, unspecified: Secondary | ICD-10-CM

## 2013-04-04 DIAGNOSIS — H109 Unspecified conjunctivitis: Secondary | ICD-10-CM

## 2013-04-04 MED ORDER — POLYMYXIN B-TRIMETHOPRIM 10000-0.1 UNIT/ML-% OP SOLN: 1.0000 [drp] | OPHTHALMIC | Status: DC

## 2013-04-04 NOTE — Patient Instructions (Addendum)

## 2013-04-04 NOTE — Progress Notes (Signed)
  Subjective:    Patient ID: Shawn Anderson, male    DOB: February 11, 1951, 62 y.o.   MRN: [id number]  HPI Acute visit. Patient seen with possible pinkeye. Onset of symptoms yesterday. Bilateral redness and itching. He has noted some crusted drainage upper and lower lids yesterday and again this morning. Denies any eye pain. No blurred vision. Minimal nasal congestion. Denies any fever or chills. He's used some warm compresses. No known drug allergies  Past Medical History  Diagnosis Date  . CAD (coronary artery disease)   . Hyperlipidemia   . Hypertension   . Gout   . Obesity    Past Surgical History  Procedure Laterality Date  . Coronary artery bypass graft  1997  . Tonsillectomy    . Appendeceal tumor      malignant  . Hernia repair      reports that he has never smoked. He does not have any smokeless tobacco history on file. He reports that he drinks about 15.0 ounces of alcohol per week. He reports that he does not use illicit drugs. family history includes Aneurysm in his father; Heart disease in his mother; Stroke in his mother. No Known Allergies    Review of Systems  Constitutional: Negative for fever and chills.  Eyes: Positive for discharge, redness and itching. Negative for photophobia, pain and visual disturbance.       Objective:   Physical Exam  Constitutional: He appears well-developed and well-nourished.  Eyes: Pupils are equal, round, and reactive to light.  Conjunctiva are slightly erythematous bilaterally. No purulent secretions noted. Cornea appears normal.  Cardiovascular: Normal rate and regular rhythm.   Pulmonary/Chest: Effort normal and breath sounds normal.          Assessment & Plan:  Bilateral conjunctivitis. Suspect bacterial. Continue warm compresses. Polytrim ophthalmic drops every 4 hours while awake. Touch base in 2-3 days if not improving

## 2013-04-09 ENCOUNTER — Ambulatory Visit (INDEPENDENT_AMBULATORY_CARE_PROVIDER_SITE_OTHER): Payer: 59 | Admitting: Internal Medicine

## 2013-04-09 ENCOUNTER — Encounter: Payer: Self-pay | Admitting: Internal Medicine

## 2013-04-09 VITALS — BP 160/100 | Temp 98.2°F | Wt 261.0 lb

## 2013-04-09 DIAGNOSIS — H109 Unspecified conjunctivitis: Secondary | ICD-10-CM

## 2013-04-09 DIAGNOSIS — M109 Gout, unspecified: Secondary | ICD-10-CM

## 2013-04-09 DIAGNOSIS — I10 Essential (primary) hypertension: Secondary | ICD-10-CM

## 2013-04-09 LAB — BASIC METABOLIC PANEL
CO2: 29 mEq/L (ref 19–32)
Chloride: 103 mEq/L (ref 96–112)
Creatinine, Ser: 0.9 mg/dL (ref 0.4–1.5)

## 2013-04-09 MED ORDER — TOBRAMYCIN-DEXAMETHASONE 0.3-0.1 % OP SUSP: 1.0000 [drp] | Freq: Four times a day (QID) | OPHTHALMIC | Status: DC

## 2013-04-09 NOTE — Assessment & Plan Note (Addendum)
Patient has incomplete resolution of possible bacterial conjunctivitis. Treat with TobraDex 4 drops in both eyes for 7 days.  Patient also advised to flush his eyes with saline solution. Patient advised to call office if symptoms persist or worsen.

## 2013-04-09 NOTE — Progress Notes (Signed)
Subjective:    Patient ID: Shawn Anderson, male    DOB: 1951/01/06, 62 y.o.   MRN: [id number]  HPI 62 year old white male with history of coronary artery disease, hypertension, hyperlipidemia, gout alcohol use for followup. Interval medical history-patient seen by his orthopedist. 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 Patient has been taking indomethacin 50 mg once daily.  Patient reports he has had no recurrence of gout flare.   On July 30 patient seen by Dr. Selena Batten for right-sided facial saline as. Symptoms started after small bug bite. He was successfully treated with doxycycline and amoxicillin.  Patient seen on September 3 for bilateral conjunctivitis. He has been using antibiotic drops with some improvement but not complete resolution. He wakes up with a thin film especially over his right eye. He denies any eye pain or changes in vision.   Review of Systems Negative for fever chills, negative for eye pain  Past Medical History  Diagnosis Date  . CAD (coronary artery disease)   . Hyperlipidemia   . Hypertension   . Gout   . Obesity     History   Social History  . Marital Status: Married    Spouse Name: N/A    Number of Children: N/A  . Years of Education: N/A   Occupational History  . 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 Smoking status: Never Smoker   . Smokeless tobacco: Not on file  . Alcohol Use: 15.0 oz/week    30 drink(s) per week  . Drug Use: No  . Sexual Activity: Not on file   Other Topics Concern  . Not on file   Social History Narrative  . No narrative on file    Past Surgical History  Procedure Laterality Date  . Coronary artery bypass graft  1997  . Tonsillectomy    . Appendeceal tumor      malignant  . Hernia repair      Family History  Problem Relation Age of Onset  . Stroke Mother   . Heart disease Mother   . Aneurysm Father     No Known Allergies  Current  Outpatient Prescriptions on File Prior to Visit  Medication Sig Dispense Refill  . colchicine 0.6 MG tablet Take 1 tablet (0.6 mg total) by mouth daily.  30 tablet  5  . CRESTOR 40 MG tablet take 1 tablet by mouth once daily  90 tablet  1  . indomethacin (INDOCIN) 50 MG capsule Take 50 mg by mouth as needed.      Marland Kitchen lisinopril (PRINIVIL,ZESTRIL) 40 MG tablet Take 40 mg by mouth daily.      . Multiple Vitamin (MULTIVITAMIN) tablet Take 1 tablet by mouth daily.         No current facility-administered medications on file prior to visit.    BP 160/100  Temp(Src) 98.2 F (36.8 C) (Oral)  Wt 261 lb (118.389 kg)  BMI 36.42 kg/m2       Objective:   Physical Exam  Constitutional: He is oriented to person, place, and time. He appears well-developed and well-nourished.  HENT:  Head: Normocephalic and atraumatic.  Right Ear: External ear normal.  Left Ear: External ear normal.  Mouth/Throat: Oropharynx is clear and moist.  Eyes:  Mild conjunctival injection right greater than left, pupils equal and rate active to light and accommodation bilaterally,  Normal visual daily  Cardiovascular: Normal rate, regular rhythm and normal heart sounds.  No murmur heard. Pulmonary/Chest: Effort normal and breath sounds normal. He has no wheezes.  Musculoskeletal: He exhibits no edema.  Neurological: He is alert and oriented to person, place, and time. No cranial nerve deficit.  Psychiatric: He has a normal mood and affect. His behavior is normal.          Assessment & Plan:

## 2013-04-09 NOTE — Assessment & Plan Note (Signed)
Patient's blood pressures sporadically elevated. Patient advised to obtain home blood pressure cuff. If BP is suboptimally controlled, continue lisinopril but consider adding amlodipine. BP: 160/100 mmHg

## 2013-04-09 NOTE — Assessment & Plan Note (Signed)
Patient could not tolerate losartan. He has been taking indomethacin 50 mg once daily. His recent uric acid level 7.7. Monitor renal function while taking indomethacin.

## 2013-04-09 NOTE — Patient Instructions (Addendum)
Please obtain automated blood pressure cuff and monitor home BP readings as directed Contact our office if your eye symptoms do not improve within 1 week

## 2013-04-12 ENCOUNTER — Encounter: Payer: Self-pay | Admitting: *Deleted

## 2013-05-22 ENCOUNTER — Encounter: Payer: Self-pay | Admitting: Internal Medicine

## 2013-05-22 ENCOUNTER — Ambulatory Visit (INDEPENDENT_AMBULATORY_CARE_PROVIDER_SITE_OTHER): Payer: 59 | Admitting: Internal Medicine

## 2013-05-22 ENCOUNTER — Ambulatory Visit: Payer: 59 | Admitting: Internal Medicine

## 2013-05-22 VITALS — BP 162/90 | HR 79 | Temp 97.9°F | Resp 20 | Wt 266.0 lb

## 2013-05-22 DIAGNOSIS — H109 Unspecified conjunctivitis: Secondary | ICD-10-CM

## 2013-05-22 DIAGNOSIS — E785 Hyperlipidemia, unspecified: Secondary | ICD-10-CM

## 2013-05-22 DIAGNOSIS — I10 Essential (primary) hypertension: Secondary | ICD-10-CM

## 2013-05-22 MED ORDER — AZELASTINE HCL 0.05 % OP SOLN: 1.0000 [drp] | Freq: Two times a day (BID) | OPHTHALMIC | Status: DC

## 2013-05-22 NOTE — Patient Instructions (Signed)
Limit your sodium (Salt) intake  Please check your blood pressure on a regular basis.  If it is consistently greater than 150/90, please make an office appointment.  Allergic Conjunctivitis The conjunctiva is a thin membrane that covers the visible white part of the eyeball and the underside of the eyelids. This membrane protects and lubricates the eye. The membrane has small blood vessels running through it that can normally be seen. When the conjunctiva becomes inflamed, the condition is called conjunctivitis. In response to the inflammation, the conjunctival blood vessels become swollen. The swelling results in redness in the normally white part of the eye. The blood vessels of this membrane also react when a person has allergies and is then called allergic conjunctivitis. This condition usually lasts for as long as the allergy persists. Allergic conjunctivitis cannot be passed to another person (non-contagious). The likelihood of bacterial infection is great and the cause is not likely due to allergies if the inflamed eye has:  A sticky discharge.  Discharge or sticking together of the lids in the morning.  Scaling or flaking of the eyelids where the eyelashes come out.  Red swollen eyelids. CAUSES   Viruses.  Irritants such as foreign bodies.  Chemicals.  General allergic reactions.  Inflammation or serious diseases in the inside or the outside of the eye or the orbit (the boney cavity in which the eye sits) can cause a "red eye." SYMPTOMS   Eye redness.  Tearing.  Itchy eyes.  Burning feeling in the eyes.  Clear drainage from the eye.  Allergic reaction due to pollens or ragweed sensitivity. Seasonal allergic conjunctivitis is frequent in the spring when pollens are in the air and in the fall. DIAGNOSIS  This condition, in its many forms, is usually diagnosed based on the history and an ophthalmological exam. It usually involves both eyes. If your eyes react at the same  time every year, allergies may be the cause. While most "red eyes" are due to allergy or an infection, the role of an eye (ophthalmological) exam is important. The exam can rule out serious diseases of the eye or orbit. TREATMENT   Non-antibiotic eye drops, ointments, or medications by mouth may be prescribed if the ophthalmologist is sure the conjunctivitis is due to allergies alone.  Over-the-counter drops and ointments for allergic symptoms should be used only after other causes of conjunctivitis have been ruled out, or as your caregiver suggests. Medications by mouth are often prescribed if other allergy-related symptoms are present. If the ophthalmologist is sure that the conjunctivitis is due to allergies alone, treatment is normally limited to drops or ointments to reduce itching and burning. HOME CARE INSTRUCTIONS   Wash hands before and after applying drops or ointments, or touching the inflamed eye(s) or eyelids.  Do not let the eye dropper tip or ointment tube touch the eyelid when putting medicine in your eye.  Stop using your soft contact lenses and throw them away. Use a new pair of lenses when recovery is complete. You should run through sterilizing cycles at least three times before use after complete recovery if the old soft contact lenses are to be used. Hard contact lenses should be stopped. They need to be thoroughly sterilized before use after recovery.  Itching and burning eyes due to allergies is often relieved by using a cool cloth applied to closed eye(s). SEEK MEDICAL CARE IF:   Your problems do not go away after two or three days of treatment.  Your lids  are sticky (especially in the morning when you wake up) or stick together.  Discharge develops. Antibiotics may be needed either as drops, ointment, or by mouth.  You have extreme light sensitivity.  An oral temperature above 102 F (38.9 C) develops.  Pain in or around the eye or any other visual symptom  develops. MAKE SURE YOU:   Understand these instructions.  Will watch your condition.  Will get help right away if you are not doing well or get worse. Document Released: 10/09/2002 Document Revised: 10/11/2011 Document Reviewed: 09/04/2007 [facility] Patient Information 2014 [place], [place].

## 2013-05-22 NOTE — Progress Notes (Signed)
  Subjective:    Patient ID: Shawn Anderson, male    DOB: 1950-11-11, 62 y.o.   MRN: [id number]  HPI  62 year old patient who presents with a chief complaint of bilateral red itchy eyes. He initially presented in early September and has been treated twice for suspected bacterial conjunctivitis. Symptoms seem to improve but are not totally eradicated. He has no prior history of allergies. He does have a history of dry eyes and has not been able to wear  contacts. No visual disturbances  Past Medical History  Diagnosis Date  . CAD (coronary artery disease)   . Hyperlipidemia   . Hypertension   . Gout   . Obesity     History   Social History  . Marital Status: Married    Spouse Name: N/A    Number of Children: N/A  . Years of Education: N/A   Occupational History  . 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 Smoking status: Never Smoker   . Smokeless tobacco: Not on file  . Alcohol Use: 15.0 oz/week    30 drink(s) per week  . Drug Use: No  . Sexual Activity: Not on file   Other Topics Concern  . Not on file   Social History Narrative  . No narrative on file    Past Surgical History  Procedure Laterality Date  . Coronary artery bypass graft  1997  . Tonsillectomy    . Appendeceal tumor      malignant  . Hernia repair      Family History  Problem Relation Age of Onset  . Stroke Mother   . Heart disease Mother   . Aneurysm Father     No Known Allergies  Current Outpatient Prescriptions on File Prior to Visit  Medication Sig Dispense Refill  . colchicine 0.6 MG tablet Take 1 tablet (0.6 mg total) by mouth daily.  30 tablet  5  . indomethacin (INDOCIN) 50 MG capsule Take 50 mg by mouth as needed.      Marland Kitchen lisinopril (PRINIVIL,ZESTRIL) 40 MG tablet Take 40 mg by mouth daily.      . CRESTOR 40 MG tablet take 1 tablet by mouth once daily  90 tablet  1   No current facility-administered medications on file prior to visit.     BP 162/90  Pulse 79  Temp(Src) 97.9 F (36.6 C) (Oral)  Resp 20  Wt 266 lb (120.657 kg)  BMI 37.12 kg/m2  SpO2 98%       Review of Systems  Eyes: Positive for redness and itching. Negative for photophobia, pain, discharge and visual disturbance.       Objective:   Physical Exam  Constitutional: He appears well-developed and well-nourished. No distress.  Blood pressure 150/88  Eyes: Pupils are equal, round, and reactive to light.  Bilateral mild conjunctival injection No exudate          Assessment & Plan:   Probable allergic conjunctivitis. Will place on Astelin drops. We'll continue irrigation with saline wash. We'll call if unimproved

## 2013-06-04 ENCOUNTER — Other Ambulatory Visit (HOSPITAL_COMMUNITY): Payer: Self-pay | Admitting: *Deleted

## 2013-06-05 ENCOUNTER — Encounter (HOSPITAL_COMMUNITY): Payer: Self-pay

## 2013-06-05 ENCOUNTER — Ambulatory Visit (HOSPITAL_COMMUNITY)
Admission: RE | Admit: 2013-06-05 | Discharge: 2013-06-05 | Disposition: A | Discharge status: Home / Self Care | Payer: 59 | Source: Ambulatory Visit | Attending: Orthopedic Surgery | Admitting: Orthopedic Surgery

## 2013-06-05 ENCOUNTER — Encounter (HOSPITAL_COMMUNITY): Payer: Self-pay | Admitting: Pharmacy Technician

## 2013-06-05 ENCOUNTER — Encounter (HOSPITAL_COMMUNITY)
Admission: RE | Admit: 2013-06-05 | Discharge: 2013-06-05 | Disposition: A | Discharge status: Home / Self Care | Payer: 59 | Source: Ambulatory Visit | Attending: Orthopedic Surgery | Admitting: Orthopedic Surgery

## 2013-06-05 DIAGNOSIS — Z01818 Encounter for other preprocedural examination: Secondary | ICD-10-CM | POA: Insufficient documentation

## 2013-06-05 DIAGNOSIS — Z01812 Encounter for preprocedural laboratory examination: Secondary | ICD-10-CM | POA: Insufficient documentation

## 2013-06-05 DIAGNOSIS — M169 Osteoarthritis of hip, unspecified: Secondary | ICD-10-CM | POA: Insufficient documentation

## 2013-06-05 DIAGNOSIS — Z01810 Encounter for preprocedural cardiovascular examination: Secondary | ICD-10-CM | POA: Insufficient documentation

## 2013-06-05 DIAGNOSIS — M1610 Unilateral primary osteoarthritis, unspecified hip: Secondary | ICD-10-CM | POA: Insufficient documentation

## 2013-06-05 HISTORY — DX: Nocturia: R35.1

## 2013-06-05 HISTORY — DX: Personal history of urinary calculi: Z87.442

## 2013-06-05 HISTORY — DX: Acute myocardial infarction, unspecified: I21.9

## 2013-06-05 HISTORY — DX: Plantar fascial fibromatosis: M72.2

## 2013-06-05 HISTORY — DX: Unspecified osteoarthritis, unspecified site: M19.90

## 2013-06-05 HISTORY — DX: Other specified soft tissue disorders: M79.89

## 2013-06-05 HISTORY — DX: Patellar tendinitis, unspecified knee: M76.50

## 2013-06-05 LAB — URINALYSIS, ROUTINE W REFLEX MICROSCOPIC
Leukocytes, UA: NEGATIVE
Nitrite: NEGATIVE
Specific Gravity, Urine: 1.017 (ref 1.005–1.030)
Urobilinogen, UA: 0.2 mg/dL (ref 0.0–1.0)
pH: 6 (ref 5.0–8.0)

## 2013-06-05 LAB — BASIC METABOLIC PANEL
BUN: 21 mg/dL (ref 6–23)
CO2: 27 mEq/L (ref 19–32)
Chloride: 97 mEq/L (ref 96–112)
GFR calc Af Amer: >90 mL/min (ref >90)
GFR calc non Af Amer: 87 mL/min — ABNORMAL LOW (ref >90)
Potassium: 4.9 mEq/L (ref 3.5–5.1)
Sodium: 134 mEq/L — ABNORMAL LOW (ref 135–145)

## 2013-06-05 LAB — CBC
HCT: 37.4 % — ABNORMAL LOW (ref 39.0–52.0)
Hemoglobin: 12.6 g/dL — ABNORMAL LOW (ref 13.0–17.0)
MCHC: 33.7 g/dL (ref 30.0–36.0)
MCV: 92.6 fL (ref 78.0–100.0)
RBC: 4.04 MIL/uL — ABNORMAL LOW (ref 4.22–5.81)
WBC: 5.4 10*3/uL (ref 4.0–10.5)

## 2013-06-05 LAB — SURGICAL PCR SCREEN: Staphylococcus aureus: POSITIVE — AB

## 2013-06-05 LAB — PROTIME-INR
INR: 0.93 (ref 0.00–1.49)
Prothrombin Time: 12.3 seconds (ref 11.6–15.2)

## 2013-06-05 NOTE — Patient Instructions (Addendum)
Shawn Anderson  06/05/2013                           YOUR PROCEDURE IS SCHEDULED ON: 06/12/13               PLEASE REPORT TO SHORT STAY CENTER AT :  7:30 AM               CALL THIS NUMBER IF ANY PROBLEMS THE DAY OF SURGERY :               [phone]                      REMEMBER:   Do not eat food or drink liquids AFTER MIDNIGHT   Take these medicines the morning of surgery with A SIP OF WATER: ZYRTEC / CRESTOR   Do not wear jewelry, make-up   Do not wear lotions, powders, or perfumes.   Do not shave legs or underarms 12 hrs. before surgery (men may shave face)  Do not bring valuables to the hospital.  Contacts, dentures or bridgework may not be worn into surgery.  Leave suitcase in the car. After surgery it may be brought to your room.  For patients admitted to the hospital more than one night, checkout time is 11:00                          The day of discharge.   Patients discharged the day of surgery will not be allowed to drive home                             If going home same day of surgery, must have someone stay with you first                           24 hrs at home and arrange for some one to drive you home from hospital.    Special Instructions:   Please read over the following fact sheets that you were given:               1. MRSA  INFORMATION                      2.  PREPARING FOR SURGERY SHEET               3. INCENTIVE SPIROMETER                                                X_____________________________________________________________________        Failure to follow these instructions may result in cancellation of your surgery

## 2013-06-05 NOTE — Progress Notes (Signed)
06/05/13 0924  OBSTRUCTIVE SLEEP APNEA  Have you ever been diagnosed with sleep apnea through a sleep study? No  Do you snore loudly (loud enough to be heard through closed doors)?  1  Do you often feel tired, fatigued, or sleepy during the daytime? 0  Has anyone observed you stop breathing during your sleep? 0  Do you have, or are you being treated for high blood pressure? 1  BMI more than 35 kg/m2? 0  Age over 62 years old? 1  Neck circumference greater than 40 cm/18 inches? 0  Gender: 1  Obstructive Sleep Apnea Score 4  Score 4 or greater  Results sent to PCP

## 2013-06-06 NOTE — Progress Notes (Signed)
Message left with Natasha Mead at office concerning +PCR for Staph - pt notified

## 2013-06-07 ENCOUNTER — Other Ambulatory Visit: Payer: Self-pay

## 2013-06-10 NOTE — H&P (Signed)
TOTAL HIP ADMISSION H&P  Patient is admitted for right total hip arthroplasty, anterior approach.  Subjective:  Chief Complaint: Right hip OA / pain  HPI: Zachery Conch, 62 y.o. male, has a history of pain and functional disability in the right hip(s) due to arthritis and patient has failed non-surgical conservative treatments for greater than 12 weeks to include NSAID's and/or analgesics, supervised PT with diminished ADL's post treatment and activity modification.  Onset of symptoms was gradual starting years ago with rapidlly worsening course since that time.The patient noted no past surgery on the right hip(s).  Patient currently rates pain in the right hip at 5 out of 10 with activity, also having significant decrease in ROM. Patient has worsening of pain with activity and weight bearing, trendelenberg gait, pain that interfers with activities of daily living and pain with passive range of motion. Patient has evidence of periarticular osteophytes and joint space narrowing by imaging studies. This condition presents safety issues increasing the risk of falls.  There is no current active infection.  Risks, benefits and expectations were discussed with the patient.  Risks including but not limited to the risk of anesthesia, blood clots, nerve damage, blood vessel damage, failure of the prosthesis, infection and up to and including death.  Patient understand the risks, benefits and expectations and wishes to proceed with surgery.   D/C Plans:   Home with HHPT  Post-op Meds:    No Rx given  Tranexamic Acid:   Not to be given - MI / CAD  Decadron:    To be given  FYI:    ASA post-op   Patient Active Problem List   Diagnosis Date Noted  . Conjunctivitis 04/09/2013  . Left foot pain 12/19/2012  . Right inguinal pain 12/19/2012  . Nonspecific elevation of levels of transaminase or lactic acid dehydrogenase (LDH) 12/19/2012  . Preventative health care 08/03/2012  . Actinic keratosis 08/03/2012   . GOUT 09/01/2009  . HYPERLIPIDEMIA 02/21/2007  . HYPERTENSION 02/21/2007  . CORONARY ARTERY DISEASE 02/21/2007  . RENAL STONE 02/21/2007   Past Medical History  Diagnosis Date  . Hyperlipidemia   . Hypertension   . Gout   . Obesity   . Myocardial infarction 1997  . CAD (coronary artery disease)     seen by Dr. Eden Emms  . Arthritis   . Plantar fasciitis   . Patellar tendonitis   . Foot swelling   . History of kidney stones   . Nocturia     Past Surgical History  Procedure Laterality Date  . Tonsillectomy    . Appendeceal tumor    . Hernia repair    . Coronary artery bypass graft  1997    3 VESSELS    No prescriptions prior to admission   No Known Allergies   History  Substance Use Topics  . Smoking status: Never Smoker   . Smokeless tobacco: Not on file  . Alcohol Use: 0.0 oz/week     Comment: 2 gl wine daily    Family History  Problem Relation Age of Onset  . Stroke Mother   . Heart disease Mother   . Aneurysm Father      Review of Systems  Constitutional: Negative.   HENT: Negative.   Eyes: Negative.   Respiratory: Negative.   Cardiovascular: Negative.   Gastrointestinal: Negative.   Genitourinary: Negative.   Musculoskeletal: Positive for joint pain.  Skin: Negative.   Neurological: Negative.   Endo/Heme/Allergies: Positive for environmental allergies.  Psychiatric/Behavioral:  Negative.     Objective:  Physical Exam  Constitutional: He is oriented to person, place, and time. He appears well-developed and well-nourished.  HENT:  Head: Normocephalic and atraumatic.  Mouth/Throat: Oropharynx is clear and moist.  Eyes: Pupils are equal, round, and reactive to light.  Neck: Neck supple. No JVD present. No tracheal deviation present. No thyromegaly present.  Cardiovascular: Normal rate, regular rhythm, normal heart sounds and intact distal pulses.   Respiratory: Effort normal and breath sounds normal. No stridor. No respiratory distress. He has  no wheezes.  GI: Soft. There is no tenderness. There is no guarding.  Musculoskeletal:       Right hip: He exhibits decreased range of motion, decreased strength, tenderness and bony tenderness. He exhibits no swelling, no deformity and no laceration.  Lymphadenopathy:    He has no cervical adenopathy.  Neurological: He is alert and oriented to person, place, and time.  Skin: Skin is warm and dry.  Psychiatric: He has a normal mood and affect.    Labs:  Estimated body mass index is 36.42 kg/(m^2) as calculated from the following:   Height as of 12/20/12: 5\' 11"  (1.803 m).   Weight as of 04/09/13: 118.389 kg (261 lb).   Imaging Review Plain radiographs demonstrate severe degenerative joint disease of the right hip(s). The bone quality appears to be good for age and reported activity level.  Assessment/Plan:  End stage arthritis, right hip(s)  The patient history, physical examination, clinical judgement of the provider and imaging studies are consistent with end stage degenerative joint disease of the right hip(s) and total hip arthroplasty is deemed medically necessary. The treatment options including medical management, injection therapy, arthroscopy and arthroplasty were discussed at length. The risks and benefits of total hip arthroplasty were presented and reviewed. The risks due to aseptic loosening, infection, stiffness, dislocation/subluxation,  thromboembolic complications and other imponderables were discussed.  The patient acknowledged the explanation, agreed to proceed with the plan and consent was signed. Patient is being admitted for inpatient treatment for surgery, pain control, PT, OT, prophylactic antibiotics, VTE prophylaxis, progressive ambulation and ADL's and discharge planning.The patient is planning to be discharged home with home health services.    Anastasio Auerbach Felicitas Sine   PAC  06/10/2013, 5:40 PM

## 2013-06-11 MED ORDER — DEXTROSE 5 % IV SOLN
3.0000 g | INTRAVENOUS | Status: AC
  Administered 2013-06-12: 3 g via INTRAVENOUS
  Filled 2013-06-11: qty 3000

## 2013-06-12 ENCOUNTER — Encounter (HOSPITAL_COMMUNITY): Payer: 59 | Admitting: Anesthesiology

## 2013-06-12 ENCOUNTER — Encounter (HOSPITAL_COMMUNITY): Payer: Self-pay | Admitting: *Deleted

## 2013-06-12 ENCOUNTER — Inpatient Hospital Stay (HOSPITAL_COMMUNITY): Payer: 59

## 2013-06-12 ENCOUNTER — Encounter (HOSPITAL_COMMUNITY)
Admission: RE | Disposition: A | Discharge status: Home Health | Payer: Self-pay | Source: Ambulatory Visit | Attending: Orthopedic Surgery

## 2013-06-12 ENCOUNTER — Inpatient Hospital Stay (HOSPITAL_COMMUNITY)
Admission: RE | Admit: 2013-06-12 | Discharge: 2013-06-13 | DRG: 470 | Disposition: A | Discharge status: Home Health | Payer: 59 | Source: Ambulatory Visit | Attending: Orthopedic Surgery | Admitting: Orthopedic Surgery

## 2013-06-12 ENCOUNTER — Other Ambulatory Visit: Payer: Self-pay | Admitting: Internal Medicine

## 2013-06-12 ENCOUNTER — Inpatient Hospital Stay (HOSPITAL_COMMUNITY): Payer: 59 | Admitting: Anesthesiology

## 2013-06-12 DIAGNOSIS — Z87442 Personal history of urinary calculi: Secondary | ICD-10-CM

## 2013-06-12 DIAGNOSIS — Z8249 Family history of ischemic heart disease and other diseases of the circulatory system: Secondary | ICD-10-CM

## 2013-06-12 DIAGNOSIS — M1610 Unilateral primary osteoarthritis, unspecified hip: Principal | ICD-10-CM | POA: Diagnosis present

## 2013-06-12 DIAGNOSIS — Z96649 Presence of unspecified artificial hip joint: Secondary | ICD-10-CM

## 2013-06-12 DIAGNOSIS — I10 Essential (primary) hypertension: Secondary | ICD-10-CM | POA: Diagnosis present

## 2013-06-12 DIAGNOSIS — Z6834 Body mass index (BMI) 34.0-34.9, adult: Secondary | ICD-10-CM

## 2013-06-12 DIAGNOSIS — E871 Hypo-osmolality and hyponatremia: Secondary | ICD-10-CM | POA: Diagnosis not present

## 2013-06-12 DIAGNOSIS — D500 Iron deficiency anemia secondary to blood loss (chronic): Secondary | ICD-10-CM | POA: Diagnosis not present

## 2013-06-12 DIAGNOSIS — M169 Osteoarthritis of hip, unspecified: Principal | ICD-10-CM | POA: Diagnosis present

## 2013-06-12 DIAGNOSIS — I252 Old myocardial infarction: Secondary | ICD-10-CM

## 2013-06-12 DIAGNOSIS — E785 Hyperlipidemia, unspecified: Secondary | ICD-10-CM | POA: Diagnosis present

## 2013-06-12 DIAGNOSIS — Z951 Presence of aortocoronary bypass graft: Secondary | ICD-10-CM

## 2013-06-12 DIAGNOSIS — M109 Gout, unspecified: Secondary | ICD-10-CM | POA: Diagnosis present

## 2013-06-12 DIAGNOSIS — E669 Obesity, unspecified: Secondary | ICD-10-CM | POA: Diagnosis present

## 2013-06-12 DIAGNOSIS — I2510 Atherosclerotic heart disease of native coronary artery without angina pectoris: Secondary | ICD-10-CM | POA: Diagnosis present

## 2013-06-12 DIAGNOSIS — Z823 Family history of stroke: Secondary | ICD-10-CM

## 2013-06-12 DIAGNOSIS — D62 Acute posthemorrhagic anemia: Secondary | ICD-10-CM | POA: Diagnosis not present

## 2013-06-12 HISTORY — PX: TOTAL HIP ARTHROPLASTY: SHX124

## 2013-06-12 LAB — TYPE AND SCREEN
ABO/RH(D): AB POS
Antibody Screen: NEGATIVE

## 2013-06-12 SURGERY — ARTHROPLASTY, HIP, TOTAL, ANTERIOR APPROACH
Anesthesia: General | Site: Hip | Laterality: Right | Wound class: Clean

## 2013-06-12 MED ORDER — MIDAZOLAM HCL 5 MG/5ML IJ SOLN
INTRAMUSCULAR | Status: DC | PRN
  Administered 2013-06-12: 2 mg via INTRAVENOUS

## 2013-06-12 MED ORDER — POLYETHYLENE GLYCOL 3350 17 G PO PACK: 17.0000 g | PACK | Freq: Every day | ORAL | Status: DC | PRN

## 2013-06-12 MED ORDER — MENTHOL 3 MG MT LOZG
1.0000 | LOZENGE | OROMUCOSAL | Status: DC | PRN
  Filled 2013-06-12: qty 9

## 2013-06-12 MED ORDER — FERROUS SULFATE 325 (65 FE) MG PO TABS
325.0000 mg | ORAL_TABLET | Freq: Three times a day (TID) | ORAL | Status: DC
  Administered 2013-06-13 (×2): 325 mg via ORAL
  Filled 2013-06-12 (×5): qty 1

## 2013-06-12 MED ORDER — DOCUSATE SODIUM 100 MG PO CAPS
100.0000 mg | ORAL_CAPSULE | Freq: Two times a day (BID) | ORAL | Status: DC
  Administered 2013-06-12 – 2013-06-13 (×2): 100 mg via ORAL

## 2013-06-12 MED ORDER — LACTATED RINGERS IV SOLN: INTRAVENOUS | Status: DC

## 2013-06-12 MED ORDER — ROCURONIUM BROMIDE 100 MG/10ML IV SOLN
INTRAVENOUS | Status: DC | PRN
  Administered 2013-06-12: 30 mg via INTRAVENOUS

## 2013-06-12 MED ORDER — CEFAZOLIN SODIUM-DEXTROSE 2-3 GM-% IV SOLR
2.0000 g | Freq: Four times a day (QID) | INTRAVENOUS | Status: AC
  Administered 2013-06-12 (×2): 2 g via INTRAVENOUS
  Filled 2013-06-12 (×2): qty 50

## 2013-06-12 MED ORDER — LORATADINE 10 MG PO TABS
10.0000 mg | ORAL_TABLET | Freq: Every day | ORAL | Status: DC
  Administered 2013-06-13: 10 mg via ORAL
  Filled 2013-06-12: qty 1

## 2013-06-12 MED ORDER — DEXAMETHASONE SODIUM PHOSPHATE 10 MG/ML IJ SOLN
10.0000 mg | Freq: Once | INTRAMUSCULAR | Status: AC
  Administered 2013-06-13: 10 mg via INTRAVENOUS
  Filled 2013-06-12: qty 1

## 2013-06-12 MED ORDER — 0.9 % SODIUM CHLORIDE (POUR BTL) OPTIME
TOPICAL | Status: DC | PRN
  Administered 2013-06-12: 2000 mL

## 2013-06-12 MED ORDER — GLYCOPYRROLATE 0.2 MG/ML IJ SOLN
INTRAMUSCULAR | Status: DC | PRN
  Administered 2013-06-12: 0.6 mg via INTRAVENOUS

## 2013-06-12 MED ORDER — SODIUM CHLORIDE 0.9 % IV SOLN
INTRAVENOUS | Status: DC
  Administered 2013-06-12: 16:00:00 via INTRAVENOUS
  Filled 2013-06-12 (×5): qty 1000

## 2013-06-12 MED ORDER — PHENYLEPHRINE HCL 10 MG/ML IJ SOLN
INTRAMUSCULAR | Status: DC | PRN
  Administered 2013-06-12 (×5): 80 ug via INTRAVENOUS

## 2013-06-12 MED ORDER — PROPOFOL 10 MG/ML IV BOLUS
INTRAVENOUS | Status: DC | PRN
  Administered 2013-06-12: 200 mg via INTRAVENOUS
  Administered 2013-06-12: 90 mg via INTRAVENOUS

## 2013-06-12 MED ORDER — HYDROCODONE-ACETAMINOPHEN 7.5-325 MG PO TABS
1.0000 | ORAL_TABLET | ORAL | Status: DC
  Administered 2013-06-12 – 2013-06-13 (×5): 1 via ORAL
  Filled 2013-06-12: qty 2
  Filled 2013-06-12 (×4): qty 1

## 2013-06-12 MED ORDER — HYDROMORPHONE HCL PF 1 MG/ML IJ SOLN
0.2500 mg | INTRAMUSCULAR | Status: DC | PRN
  Administered 2013-06-12 (×2): 0.5 mg via INTRAVENOUS

## 2013-06-12 MED ORDER — DIPHENHYDRAMINE HCL 12.5 MG/5ML PO ELIX: 25.0000 mg | ORAL_SOLUTION | Freq: Four times a day (QID) | ORAL | Status: DC | PRN

## 2013-06-12 MED ORDER — ALUMINUM HYDROXIDE GEL 320 MG/5ML PO SUSP: 15.0000 mL | ORAL | Status: DC | PRN

## 2013-06-12 MED ORDER — LIDOCAINE HCL (CARDIAC) 20 MG/ML IV SOLN
INTRAVENOUS | Status: DC | PRN
  Administered 2013-06-12: 100 mg via INTRAVENOUS

## 2013-06-12 MED ORDER — HYDROMORPHONE HCL PF 1 MG/ML IJ SOLN
INTRAMUSCULAR | Status: DC | PRN
  Administered 2013-06-12 (×2): 1 mg via INTRAVENOUS

## 2013-06-12 MED ORDER — LACTATED RINGERS IV SOLN
INTRAVENOUS | Status: DC
  Administered 2013-06-12: 10:00:00 via INTRAVENOUS
  Administered 2013-06-12: 1000 mL via INTRAVENOUS

## 2013-06-12 MED ORDER — FENTANYL CITRATE 0.05 MG/ML IJ SOLN
INTRAMUSCULAR | Status: DC | PRN
  Administered 2013-06-12 (×3): 50 ug via INTRAVENOUS
  Administered 2013-06-12: 100 ug via INTRAVENOUS

## 2013-06-12 MED ORDER — ASPIRIN EC 325 MG PO TBEC
325.0000 mg | DELAYED_RELEASE_TABLET | Freq: Two times a day (BID) | ORAL | Status: DC
  Administered 2013-06-13: 325 mg via ORAL
  Filled 2013-06-12 (×3): qty 1

## 2013-06-12 MED ORDER — METHOCARBAMOL 500 MG PO TABS
500.0000 mg | ORAL_TABLET | Freq: Four times a day (QID) | ORAL | Status: DC | PRN
  Administered 2013-06-12 – 2013-06-13 (×2): 500 mg via ORAL
  Filled 2013-06-12 (×2): qty 1

## 2013-06-12 MED ORDER — ZOLPIDEM TARTRATE 5 MG PO TABS: 5.0000 mg | ORAL_TABLET | Freq: Every evening | ORAL | Status: DC | PRN

## 2013-06-12 MED ORDER — METHOCARBAMOL 100 MG/ML IJ SOLN
500.0000 mg | Freq: Four times a day (QID) | INTRAVENOUS | Status: DC | PRN
  Administered 2013-06-12: 500 mg via INTRAVENOUS
  Filled 2013-06-12: qty 5

## 2013-06-12 MED ORDER — CHLORHEXIDINE GLUCONATE 4 % EX LIQD: 60.0000 mL | Freq: Once | CUTANEOUS | Status: DC

## 2013-06-12 MED ORDER — NEOSTIGMINE METHYLSULFATE 1 MG/ML IJ SOLN
INTRAMUSCULAR | Status: DC | PRN
  Administered 2013-06-12: 5 mg via INTRAVENOUS

## 2013-06-12 MED ORDER — EPHEDRINE SULFATE 50 MG/ML IJ SOLN
INTRAMUSCULAR | Status: DC | PRN
  Administered 2013-06-12 (×2): 10 mg via INTRAVENOUS
  Administered 2013-06-12: 5 mg via INTRAVENOUS

## 2013-06-12 MED ORDER — HYDROMORPHONE HCL PF 1 MG/ML IJ SOLN: 0.5000 mg | INTRAMUSCULAR | Status: DC | PRN

## 2013-06-12 MED ORDER — HYDROMORPHONE HCL PF 1 MG/ML IJ SOLN
INTRAMUSCULAR | Status: AC
  Filled 2013-06-12: qty 1

## 2013-06-12 MED ORDER — ONDANSETRON HCL 4 MG/2ML IJ SOLN
INTRAMUSCULAR | Status: DC | PRN
  Administered 2013-06-12: 4 mg via INTRAVENOUS

## 2013-06-12 MED ORDER — PHENOL 1.4 % MT LIQD: 1.0000 | OROMUCOSAL | Status: DC | PRN

## 2013-06-12 MED ORDER — SUCCINYLCHOLINE CHLORIDE 20 MG/ML IJ SOLN
INTRAMUSCULAR | Status: DC | PRN
  Administered 2013-06-12 (×2): 100 mg via INTRAVENOUS

## 2013-06-12 MED ORDER — DEXAMETHASONE SODIUM PHOSPHATE 10 MG/ML IJ SOLN
10.0000 mg | Freq: Once | INTRAMUSCULAR | Status: AC
  Administered 2013-06-12: 10 mg via INTRAVENOUS

## 2013-06-12 MED ORDER — ONDANSETRON HCL 4 MG PO TABS: 4.0000 mg | ORAL_TABLET | Freq: Four times a day (QID) | ORAL | Status: DC | PRN

## 2013-06-12 MED ORDER — ONDANSETRON HCL 4 MG/2ML IJ SOLN
4.0000 mg | Freq: Four times a day (QID) | INTRAMUSCULAR | Status: DC | PRN
  Administered 2013-06-12: 4 mg via INTRAVENOUS
  Filled 2013-06-12: qty 2

## 2013-06-12 MED ORDER — SENNA 8.6 MG PO TABS
1.0000 | ORAL_TABLET | Freq: Two times a day (BID) | ORAL | Status: DC
  Administered 2013-06-13: 8.6 mg via ORAL
  Filled 2013-06-12: qty 1

## 2013-06-12 SURGICAL SUPPLY — 38 items
BAG ZIPLOCK 12X15 (MISCELLANEOUS) IMPLANT
BLADE SAW SGTL 18X1.27X75 (BLADE) ×2 IMPLANT
CAPT HIP PF MOP ×2 IMPLANT
DERMABOND ADVANCED (GAUZE/BANDAGES/DRESSINGS) ×1
DERMABOND ADVANCED .7 DNX12 (GAUZE/BANDAGES/DRESSINGS) ×1 IMPLANT
DRAPE C-ARM 42X120 X-RAY (DRAPES) ×2 IMPLANT
DRAPE STERI IOBAN 125X83 (DRAPES) ×2 IMPLANT
DRAPE U-SHAPE 47X51 STRL (DRAPES) ×6 IMPLANT
DRSG AQUACEL AG ADV 3.5X10 (GAUZE/BANDAGES/DRESSINGS) ×2 IMPLANT
DRSG TEGADERM 4X4.75 (GAUZE/BANDAGES/DRESSINGS) IMPLANT
DURAPREP 26ML APPLICATOR (WOUND CARE) ×2 IMPLANT
ELECT BLADE TIP CTD 4 INCH (ELECTRODE) ×2 IMPLANT
ELECT REM PT RETURN 9FT ADLT (ELECTROSURGICAL) ×2
ELECTRODE REM PT RTRN 9FT ADLT (ELECTROSURGICAL) ×1 IMPLANT
EVACUATOR 1/8 PVC DRAIN (DRAIN) IMPLANT
FACESHIELD LNG OPTICON STERILE (SAFETY) ×8 IMPLANT
GAUZE SPONGE 2X2 8PLY STRL LF (GAUZE/BANDAGES/DRESSINGS) IMPLANT
GLOVE BIOGEL PI IND STRL 7.5 (GLOVE) ×1 IMPLANT
GLOVE BIOGEL PI IND STRL 8 (GLOVE) ×1 IMPLANT
GLOVE BIOGEL PI INDICATOR 7.5 (GLOVE) ×1
GLOVE BIOGEL PI INDICATOR 8 (GLOVE) ×1
GLOVE ECLIPSE 8.0 STRL XLNG CF (GLOVE) ×2 IMPLANT
GLOVE ORTHO TXT STRL SZ7.5 (GLOVE) ×4 IMPLANT
GOWN BRE IMP PREV XXLGXLNG (GOWN DISPOSABLE) ×2 IMPLANT
GOWN PREVENTION PLUS LG XLONG (DISPOSABLE) ×2 IMPLANT
HEAD CERAMIC 36 PLUS5 (Hips) ×2 IMPLANT
KIT BASIN OR (CUSTOM PROCEDURE TRAY) ×2 IMPLANT
PACK TOTAL JOINT (CUSTOM PROCEDURE TRAY) ×2 IMPLANT
PADDING CAST COTTON 6X4 STRL (CAST SUPPLIES) ×2 IMPLANT
SPONGE GAUZE 2X2 STER 10/PKG (GAUZE/BANDAGES/DRESSINGS)
SUCTION FRAZIER 12FR DISP (SUCTIONS) IMPLANT
SUT MNCRL AB 4-0 PS2 18 (SUTURE) ×2 IMPLANT
SUT VIC AB 1 CT1 36 (SUTURE) ×6 IMPLANT
SUT VIC AB 2-0 CT1 27 (SUTURE) ×2
SUT VIC AB 2-0 CT1 TAPERPNT 27 (SUTURE) ×2 IMPLANT
SUT VLOC 180 0 24IN GS25 (SUTURE) ×2 IMPLANT
TOWEL OR 17X26 10 PK STRL BLUE (TOWEL DISPOSABLE) ×2 IMPLANT
TRAY FOLEY METER SIL LF 16FR (CATHETERS) ×2 IMPLANT

## 2013-06-12 NOTE — Progress Notes (Signed)
Portable AP Pelvis and Lateral Right Hip X-rays done. 

## 2013-06-12 NOTE — Plan of Care (Signed)
Problem: Consults Goal: Diagnosis- Total Joint Replacement Primary Total Hip     

## 2013-06-12 NOTE — Transfer of Care (Signed)
Immediate Anesthesia Transfer of Care Note  Patient: Shawn Anderson  Procedure(s) Performed: Procedure(s): RIGHT TOTAL HIP ARTHROPLASTY ANTERIOR APPROACH (Right)  Patient Location: PACU  Anesthesia Type:General  Level of Consciousness: sedated  Airway & Oxygen Therapy: Patient Spontanous Breathing and Patient connected to face mask oxygen  Post-op Assessment: Report given to PACU RN and Post -op Vital signs reviewed and stable  Post vital signs: Reviewed and stable  Complications: No apparent anesthesia complications

## 2013-06-12 NOTE — Op Note (Signed)
NAME:  Shawn Anderson                ACCOUNT NO.: [id number]      MEDICAL RECORD NO.: [id number]      FACILITY:  [facility]      PHYSICIAN:  Durene Romans D  DATE OF BIRTH:  [birth year]     DATE OF PROCEDURE:  06/12/2013                                 OPERATIVE REPORT         PREOPERATIVE DIAGNOSIS: Right  hip osteoarthritis.      POSTOPERATIVE DIAGNOSIS:  Right hip osteoarthritis.      PROCEDURE:  Right total hip replacement through an anterior approach   utilizing DePuy THR system, component size 56mm pinnacle cup, a size 36+4 neutral   Altrex liner, a size 6 Hi Tri Lock stem with a 36+12 Articuleze metal ball.      SURGEON:  Madlyn Frankel. [name], M.D.      ASSISTANT:  Leilani Able, PA-C     ANESTHESIA:  General.      SPECIMENS:  None.      COMPLICATIONS:  None.      BLOOD LOSS:  600 cc     DRAINS:  None.      INDICATION OF THE PROCEDURE:  Shawn Anderson is a 62 y.o. male who had   presented to office for evaluation of right hip pain.  Radiographs revealed   progressive degenerative changes with bone-on-bone   articulation to the  hip joint.  The patient had painful limited range of   motion significantly affecting their overall quality of life.  The patient was failing to    respond to conservative measures, and at this point was ready   to proceed with more definitive measures.  The patient has noted progressive   degenerative changes in his hip, progressive problems and dysfunction   with regarding the hip prior to surgery.  Consent was obtained for   benefit of pain relief.  Specific risk of infection, DVT, component   failure, dislocation, need for revision surgery, as well discussion of   the anterior versus posterior approach were reviewed.  Consent was   obtained for benefit of anterior pain relief through an anterior   approach.      PROCEDURE IN DETAIL:  The patient was brought to operative theater.   Once adequate anesthesia, preoperative  antibiotics, 3gm Ancef administered.   The patient was positioned supine on the OSI Hanna table.  Once adequate   padding of boney process was carried out, we had predraped out the hip, and  used fluoroscopy to confirm orientation of the pelvis and position.      The right hip was then prepped and draped from proximal iliac crest to   mid thigh with shower curtain technique.      Time-out was performed identifying the patient, planned procedure, and   extremity.     An incision was then made 2 cm distal and lateral to the   anterior superior iliac spine extending over the orientation of the   tensor fascia lata muscle and sharp dissection was carried down to the   fascia of the muscle and protractor placed in the soft tissues.      The fascia was then incised.  The muscle belly was identified and swept   laterally  and retractor placed along the superior neck.  Following   cauterization of the circumflex vessels and removing some pericapsular   fat, a second cobra retractor was placed on the inferior neck.  A third   retractor was placed on the anterior acetabulum after elevating the   anterior rectus.  A L-capsulotomy was along the line of the   superior neck to the trochanteric fossa, then extended proximally and   distally.  Tag sutures were placed and the retractors were then placed   intracapsular.  We then identified the trochanteric fossa and   orientation of my neck cut, confirmed this radiographically   and then made a neck osteotomy with the femur on traction.  The femoral   head was removed without difficulty or complication.  Traction was let   off and retractors were placed posterior and anterior around the   acetabulum.      The labrum and foveal tissue were debrided.  I began reaming with a 51mm   reamer and reamed up to 55mm reamer with good bony bed preparation and a 56   cup was chosen.  The final 56mm Pinnacle cup was then impacted under fluoroscopy  to confirm the  depth of penetration and orientation with respect to   abduction.  A screw was placed followed by the hole eliminator.  The final   36+4 neutral Altrex liner was impacted with good visualized rim fit.  The cup was positioned anatomically within the acetabular portion of the pelvis.      At this point, the femur was rolled at 80 degrees.  Further capsule was   released off the inferior aspect of the femoral neck.  I then   released the superior capsule proximally.  The hook was placed laterally   along the femur and elevated manually and held in position with the bed   hook.  The leg was then extended and adducted with the leg rolled to 100   degrees of external rotation.  Once the proximal femur was fully   exposed, I used a box osteotome to set orientation.  I then began   broaching with the starting chili pepper broach and passed this by hand and then broached up to 6.  He was noted pre-operatively to have a type-A femur with thick cortices distally.  With the 6 broach in place I chose a high offset neck and did trial reductions.  I found that based on location of the stem within the proximal femur as well as how it compared to the position of the 7 broach that I needed the +12 ball to help restore offset and match lengths..  The offset was appropriate, leg lengths   appeared to be equal, confirmed radiographically.   Given these findings, I went ahead and dislocated the hip, repositioned all   retractors and positioned the right hip in the extended and abducted position.  The final 6 Hi Tri Lock stem was   chosen and it was impacted down to the level of neck cut.  Based on this   and the trial reduction, a 36+12 Articuleze metal ball was chosen and   impacted onto a clean and dry trunnion, and the hip was reduced.  The   hip had been irrigated throughout the case again at this point.  I did   reapproximate the superior capsular leaflet to the anterior leaflet   using #1 Vicryl, placed a  medium Hemovac drain deep.  The fascia of the  tensor fascia lata muscle was then reapproximated using #1 Vicryl.  The   remaining wound was closed with 2-0 Vicryl and running 4-0 Monocryl.   The hip was cleaned, dried, and dressed sterilely using Dermabond and   Aquacel dressing.  Drain site dressed separately.  She was then brought   to recovery room in stable condition tolerating the procedure well.    Leilani Able, PA-C was present for the entirety of the case involved from   preoperative positioning, perioperative retractor management, general   facilitation of the case, as well as primary wound closure as assistant.            Madlyn Frankel [name], M.D.            MDO/MEDQ  D:  05/25/2011  T:  05/25/2011  Job:  [id number]      Electronically Signed by Durene Romans M.D. on 05/31/2011 09:15:38 AM

## 2013-06-12 NOTE — Anesthesia Preprocedure Evaluation (Signed)
Anesthesia Evaluation  Patient identified by MRN, date of birth, ID band Patient awake    Reviewed: Allergy & Precautions, H&P , NPO status , Patient's Chart, lab work & pertinent test results  Airway Mallampati: II TM Distance: >3 FB Neck ROM: full    Dental  (+) Caps and Dental Advisory Given All upper front are capped:   Pulmonary neg pulmonary ROS,  breath sounds clear to auscultation  Pulmonary exam normal       Cardiovascular hypertension, Pt. on medications + CAD, + Past MI and + CABG Rhythm:regular Rate:Normal  MI 1997   Neuro/Psych negative neurological ROS  negative psych ROS   GI/Hepatic negative GI ROS, Neg liver ROS,   Endo/Other  negative endocrine ROS  Renal/GU negative Renal ROS  negative genitourinary   Musculoskeletal   Abdominal   Peds  Hematology negative hematology ROS (+)   Anesthesia Other Findings   Reproductive/Obstetrics negative OB ROS                           Anesthesia Physical Anesthesia Plan  ASA: III  Anesthesia Plan: General   Post-op Pain Management:    Induction: Intravenous  Airway Management Planned: Oral ETT  Additional Equipment:   Intra-op Plan:   Post-operative Plan: Extubation in OR  Informed Consent: I have reviewed the patients History and Physical, chart, labs and discussed the procedure including the risks, benefits and alternatives for the proposed anesthesia with the patient or authorized representative who has indicated his/her understanding and acceptance.   Dental Advisory Given  Plan Discussed with: CRNA and Surgeon  Anesthesia Plan Comments:         Anesthesia Quick Evaluation

## 2013-06-12 NOTE — Interval H&P Note (Signed)
History and Physical Interval Note:  06/12/2013 8:31 AM  Shawn Anderson  has presented today for surgery, with the diagnosis of Right Hip Osteoarthritis  The various methods of treatment have been discussed with the patient and family. After consideration of risks, benefits and other options for treatment, the patient has consented to  Procedure(s): RIGHT TOTAL HIP ARTHROPLASTY ANTERIOR APPROACH (Right) as a surgical intervention .  The patient's history has been reviewed, patient examined, no change in status, stable for surgery.  I have reviewed the patient's chart and labs.  Questions were answered to the patient's satisfaction.     Shelda Pal

## 2013-06-12 NOTE — Evaluation (Signed)
Physical Therapy Evaluation Patient Details Name: Shawn Anderson MRN: [id number] DOB: [birth year] Today's Date: 06/12/2013 Time: 4098-1191 PT Time Calculation (min): 36 min  PT Assessment / Plan / Recommendation History of Present Illness  Pt with R anterior THA and hisorty of gout on Left great toe and some L knee pain as well.   Clinical Impression  Pt with s/p R direct anterior THA presents with decreased strength and mobility. To benefit from PT to continue to progress pt in order to return home with assist from his wife.      PT Assessment  Patient needs continued PT services    Follow Up Recommendations  Home health PT    Does the patient have the potential to tolerate intense rehabilitation      Barriers to Discharge        Equipment Recommendations  None recommended by PT (pt was going to use a RW from a friend)    Recommendations for Other Services     Frequency 7X/week    Precautions / Restrictions     Pertinent Vitals/Pain 3-10 during session      Mobility  Bed Mobility Bed Mobility: Rolling Left;Supine to Sit;Sit to Supine Rolling Left: 4: Min assist Supine to Sit: 3: Mod assist Sit to Supine: Not Tested (comment) Details for Bed Mobility Assistance: cues for how to sequence and move supine to sit and with assist for RLE the entire time.  Transfers Transfers: Sit to Stand;Stand to Sit Sit to Stand: 3: Mod assist;With upper extremity assist;From bed Stand to Sit: 3: Mod assist;With armrests;With upper extremity assist;To chair/3-in-1 Details for Transfer Assistance: cues for LE sequence and RW safety  Ambulation/Gait Ambulation/Gait Assistance: 4: Min assist (followed by chair due to pt got dizzy towards end of walk. ) Ambulation Distance (Feet): 30 Feet Assistive device: Rolling walker Ambulation/Gait Assistance Details: step to pattern , soreness in anterior thigh area but not much Gait Pattern: Step-to pattern Gait velocity: slow Stairs: No     Exercises Total Joint Exercises Ankle Circles/Pumps: AROM;Right;5 reps Quad Sets: AROM;Right;5 reps Heel Slides: AAROM;Right;10 reps Hip ABduction/ADduction: AAROM;Right;10 reps   PT Diagnosis: Difficulty walking  PT Problem List: Decreased strength;Decreased activity tolerance;Decreased mobility PT Treatment Interventions: DME instruction;Gait training;Stair training;Functional mobility training;Therapeutic activities;Therapeutic exercise     PT Goals(Current goals can be found in the care plan section) Acute Rehab PT Goals Patient Stated Goal: To be able to get home and move around again PT Goal Formulation: With patient Time For Goal Achievement: 06/26/13 Potential to Achieve Goals: Good  Visit Information  Last PT Received On: 06/12/13 Assistance Needed: +2 (follow with chair due to slight dizziness with intial walking) History of Present Illness: Pt with R anterior THA and hisorty of gout on Left great toe and some L knee pain as well.        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  RLE: Unable to fully assess due to pain   Balance    End of Session PT - End of Session Equipment Utilized During Treatment: Gait belt;Right knee immobilizer Activity Tolerance: Patient tolerated treatment well Patient left: in chair;with call bell/phone within reach;with family/visitor present Nurse Communication: Mobility status  GP     Marella Bile 06/12/2013, 6:07 PM Marella Bile, PT Pager: [phone] 06/12/2013

## 2013-06-12 NOTE — Anesthesia Postprocedure Evaluation (Signed)
  Anesthesia Post-op Note  Patient: Shawn Anderson  Procedure(s) Performed: Procedure(s) (LRB): RIGHT TOTAL HIP ARTHROPLASTY ANTERIOR APPROACH (Right)  Patient Location: PACU  Anesthesia Type: General  Level of Consciousness: awake and alert   Airway and Oxygen Therapy: Patient Spontanous Breathing  Post-op Pain: mild  Post-op Assessment: Post-op Vital signs reviewed, Patient's Cardiovascular Status Stable, Respiratory Function Stable, Patent Airway and No signs of Nausea or vomiting  Last Vitals:  Filed Vitals:   06/12/13 1315  BP: 124/71  Pulse: 72  Temp:   Resp: 16    Post-op Vital Signs: stable   Complications: No apparent anesthesia complications

## 2013-06-12 NOTE — Progress Notes (Signed)
X-ray results noted 

## 2013-06-13 ENCOUNTER — Encounter (HOSPITAL_COMMUNITY): Payer: Self-pay | Admitting: Orthopedic Surgery

## 2013-06-13 DIAGNOSIS — D500 Iron deficiency anemia secondary to blood loss (chronic): Secondary | ICD-10-CM | POA: Diagnosis not present

## 2013-06-13 DIAGNOSIS — E871 Hypo-osmolality and hyponatremia: Secondary | ICD-10-CM | POA: Diagnosis not present

## 2013-06-13 DIAGNOSIS — E669 Obesity, unspecified: Secondary | ICD-10-CM | POA: Diagnosis present

## 2013-06-13 LAB — BASIC METABOLIC PANEL
BUN: 14 mg/dL (ref 6–23)
Calcium: 9 mg/dL (ref 8.4–10.5)
Chloride: 97 mEq/L (ref 96–112)
GFR calc Af Amer: >90 mL/min (ref >90)
GFR calc non Af Amer: >90 mL/min (ref >90)
Potassium: 4.4 mEq/L (ref 3.5–5.1)
Sodium: 132 mEq/L — ABNORMAL LOW (ref 135–145)

## 2013-06-13 LAB — CBC
HCT: 29.3 % — ABNORMAL LOW (ref 39.0–52.0)
MCHC: 34.1 g/dL (ref 30.0–36.0)
Platelets: 200 10*3/uL (ref 150–400)
RDW: 13.3 % (ref 11.5–15.5)

## 2013-06-13 MED ORDER — ASPIRIN 325 MG PO TBEC: 325.0000 mg | DELAYED_RELEASE_TABLET | Freq: Two times a day (BID) | ORAL | Status: AC

## 2013-06-13 MED ORDER — FERROUS SULFATE 325 (65 FE) MG PO TABS: 325.0000 mg | ORAL_TABLET | Freq: Three times a day (TID) | ORAL | Status: DC

## 2013-06-13 MED ORDER — HYDROCODONE-ACETAMINOPHEN 7.5-325 MG PO TABS: 1.0000 | ORAL_TABLET | ORAL | Status: DC | PRN

## 2013-06-13 MED ORDER — POLYETHYLENE GLYCOL 3350 17 G PO PACK: 17.0000 g | PACK | Freq: Every day | ORAL | Status: DC | PRN

## 2013-06-13 MED ORDER — DSS 100 MG PO CAPS: 100.0000 mg | ORAL_CAPSULE | Freq: Two times a day (BID) | ORAL | Status: DC

## 2013-06-13 MED ORDER — METHOCARBAMOL 500 MG PO TABS: 500.0000 mg | ORAL_TABLET | Freq: Four times a day (QID) | ORAL | Status: DC | PRN

## 2013-06-13 NOTE — Evaluation (Signed)
Occupational Therapy Evaluation Patient Details Name: Shawn Anderson MRN: [id number] DOB: 07-16-1951 Today's Date: 06/13/2013 Time: 4098-1191 OT Time Calculation (min): 41 min  OT Assessment / Plan / Recommendation History of present illness Pt with R anterior THA and hisorty of gout on Left great toe and some L knee pain as well.    Clinical Impression   Pt presents to OT with decreased I with ADL activity s/p THA. Pt will benefit from skilled OT to increase I with ADL activity and [facility] to [facility]    OT Assessment  Patient needs continued OT Services          Equipment Recommendations  None recommended by OT;Other (comment) (pt has decided on using urinal at night)       Frequency  Min 2X/week    Precautions / Restrictions Precautions Precautions: Anterior Hip Restrictions Weight Bearing Restrictions: No       ADL  Lower Body Bathing: Minimal assistance Where Assessed - Lower Body Bathing: Unsupported sit to stand Upper Body Dressing: Moderate assistance Where Assessed - Upper Body Dressing: Unsupported sit to stand Lower Body Dressing: Moderate assistance Toilet Transfer: Hydrographic surveyor Method: Sit to stand Toileting - Architect and Hygiene: Minimal assistance Where Assessed - Engineer, mining and Hygiene: Standing Tub/Shower Transfer: Minimal assistance    OT Diagnosis: Generalized weakness  OT Problem List: Decreased strength OT Treatment Interventions: Self-care/ADL training   OT Goals(Current goals can be found in the care plan section)    Visit Information  Last OT Received On: 06/13/13 History of Present Illness: Pt with R anterior THA and hisorty of gout on Left great toe and some L knee pain as well.        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         Vision/Perception Vision - History Patient Visual Report: No change from baseline   Cognition  Cognition Arousal/Alertness: Awake/alert Behavior During Therapy: WFL for tasks assessed/performed Overall Cognitive Status: Within Functional Limits for tasks assessed    Extremity/Trunk Assessment Upper Extremity Assessment Upper Extremity Assessment: Overall WFL for tasks assessed              End of Session OT - End of Session Equipment Utilized During Treatment: Rolling walker Activity Tolerance: Patient tolerated treatment well Patient left: in chair;with call bell/phone within reach;with family/visitor present  GO     [name] 06/13/2013, 10:15 AM

## 2013-06-13 NOTE — Progress Notes (Signed)
Physical Therapy Treatment Patient Details Name: DEMECO DUCKSWORTH MRN: [id number] DOB: [birth year] Today's Date: 06/13/2013 Time: 4132-4401 PT Time Calculation (min): 44 min  PT Assessment / Plan / Recommendation  History of Present Illness Pt with R anterior THA    PT Comments   POD # 1 pm session.  Amb pt in hallway then practiced steps backward with RW due to no rails.  2 steps.  Performed standing hip TE's following HEP then applied ICE.   Follow Up Recommendations  Home health PT     Does the patient have the potential to tolerate intense rehabilitation     Barriers to Discharge        Equipment Recommendations  None recommended by PT    Recommendations for Other Services    Frequency 7X/week   Progress towards PT Goals Progress towards PT goals: Progressing toward goals  Plan      Precautions / Restrictions Precautions Precautions: Anterior Hip Restrictions Weight Bearing Restrictions: No    Pertinent Vitals/Pain C/o "tightness"    Mobility  Bed Mobility Bed Mobility: Not assessed Details for Bed Mobility Assistance: Pt OOB in recliner Transfers Transfers: Sit to Stand;Stand to Sit Sit to Stand: 5: Supervision;From chair/3-in-1 Stand to Sit: 5: Supervision;To chair/3-in-1 Details for Transfer Assistance: increased time Ambulation/Gait Ambulation/Gait Assistance: 5: Supervision;4: Min guard Ambulation Distance (Feet): 145 Feet Assistive device: Rolling walker Ambulation/Gait Assistance Details: 25% VC's to increase knee flexion and equal weight shift. Gait Pattern: Step-to pattern;Step-through pattern Gait velocity: decreased Stairs: Yes Stairs Assistance: 4: Min assist Stairs Assistance Details (indicate cue type and reason): up backward due to no rails Stair Management Technique: No rails;Backwards;With walker Number of Stairs: 2    Exercises  THR standing TE's 10 reps R hip marching, ABD, extension and kick backs.   PT Diagnosis:    PT Problem List:    PT Treatment Interventions:     PT Goals (current goals can now be found in the care plan section)    Visit Information  Last PT Received On: 06/13/13 Assistance Needed: +1 History of Present Illness: Pt with R anterior THA     Subjective Data      Cognition       Balance     End of Session PT - End of Session Equipment Utilized During Treatment: Gait belt Activity Tolerance: Patient tolerated treatment well Patient left: in chair;with call bell/phone within reach;with family/visitor present Nurse Communication: Mobility status (Pt ready for D/C to home)   Felecia Shelling  PTA [facility]  Acute  Rehab Pager      [phone]

## 2013-06-13 NOTE — Progress Notes (Signed)
[facility]  [facility] is providing the following services: Patient declined RW and Commode - already has rw and did not want a commode at this time.   If patient discharges after hours, please call [phone].   Renard Hamper 06/13/2013, 9:02 AM

## 2013-06-13 NOTE — Progress Notes (Signed)
   Subjective: 1 Day Post-Op Procedure(s) (LRB): RIGHT TOTAL HIP ARTHROPLASTY ANTERIOR APPROACH (Right)   Patient reports pain as mild, pain well controlled. No events throughout the night. Ready to be discharged home if he does well with PT and pain is well controlled.   Objective:   VITALS:   Filed Vitals:   06/13/13 0552  BP: 109/69  Pulse: 64  Temp: 98.3 F (36.8 C)  Resp: 16    Neurovascular intact Dorsiflexion/Plantar flexion intact Incision: dressing C/D/I No cellulitis present Compartment soft  LABS  Recent Labs  06/13/13 0413  HGB 10.0*  HCT 29.3*  WBC 6.2  PLT 200     Recent Labs  06/13/13 0413  NA 132*  K 4.4  BUN 14  CREATININE 0.87  GLUCOSE 146*     Assessment/Plan: 1 Day Post-Op Procedure(s) (LRB): RIGHT TOTAL HIP ARTHROPLASTY ANTERIOR APPROACH (Right) HV drain d/c'ed Foley cath d/c'ed Advance diet Up with therapy D/C IV fluids Discharge home with home health Follow up in 2 weeks at [facility]. Follow up with OLIN,Naara Kelty D in 2 weeks.  Contact information:  [facility] [address], [address] [place] [address] [phone]    Expected ABLA  Treated with iron and will observe  Obese (BMI 30-39.9) Estimated body mass index is 34.14 kg/(m^2) as calculated from the following:   Height as of this encounter: 6\' 2"  (1.88 m).   Weight as of this encounter: 120.657 kg (266 lb). Patient also counseled that weight may inhibit the healing process Patient counseled that losing weight will help with future health issues  Hyponatremia Treated with IV fluids and will observe         Anastasio Auerbach. Keldric Poyer   PAC  06/13/2013, 9:32 AM

## 2013-06-13 NOTE — Progress Notes (Signed)
Physical Therapy Treatment Patient Details Name: ADMIRAL MARCUCCI MRN: [id number] DOB: 1950-12-28 Today's Date: 06/13/2013 Time: 1120-1200 PT Time Calculation (min): 40 min  PT Assessment / Plan / Recommendation  History of Present Illness Pt with R anterior THA    PT Comments   POD # 1 am session.  Amb in hallway, assisted to BR then performed THR TE's following HEP.    Follow Up Recommendations  Home health PT     Does the patient have the potential to tolerate intense rehabilitation     Barriers to Discharge        Equipment Recommendations  None recommended by PT    Recommendations for Other Services    Frequency 7X/week   Progress towards PT Goals Progress towards PT goals: Progressing toward goals  Plan      Precautions / Restrictions Precautions Precautions: Anterior Hip Restrictions Weight Bearing Restrictions: No    Pertinent Vitals/Pain C/o "soreness"    "tightness'    Mobility  Bed Mobility Bed Mobility: Not assessed Details for Bed Mobility Assistance: Pt OOB in recliner Transfers Transfers: Sit to Stand;Stand to Sit Sit to Stand: 4: Min guard;From chair/3-in-1;From toilet Stand to Sit: 4: Min guard;To toilet;To chair/3-in-1 Details for Transfer Assistance: cues for LE sequence and RW safety  Ambulation/Gait Ambulation/Gait Assistance: 4: Min assist Ambulation Distance (Feet): 85 Feet Assistive device: Rolling walker Ambulation/Gait Assistance Details: increased step length this session with decreased c/o pain (more soreness) Gait Pattern: Step-to pattern;Step-through pattern Gait velocity: decreased    Exercises   Total Hip Replacement TE's 10 reps ankle pumps 10 reps knee presses 10 reps heel slides 10 reps SAQ's 10 reps ABD Followed by ICE    PT Goals (current goals can now be found in the care plan section)    Visit Information  Last PT Received On: 06/13/13 History of Present Illness: Pt with R anterior THA     Subjective Data      Cognition  Cognition Arousal/Alertness: Awake/alert Behavior During Therapy: WFL for tasks assessed/performed Overall Cognitive Status: Within Functional Limits for tasks assessed    Balance     End of Session PT - End of Session Equipment Utilized During Treatment: Gait belt Activity Tolerance: Patient tolerated treatment well Patient left: in chair;with call bell/phone within reach;with family/visitor present   Felecia Shelling  PTA [facility]  Acute  Rehab Pager      [phone]

## 2013-06-13 NOTE — Care Management Note (Signed)
    Page 1 of 1   06/13/2013     1:17:10 PM   CARE MANAGEMENT NOTE 06/13/2013  Patient:  Shawn Anderson, Shawn Anderson   Account Number:  [id number]  Date Initiated:  06/13/2013  Documentation initiated by:  Colleen Can  Subjective/Objective Assessment:   dx rt hip OA; total hip replacemnt-anterior approach.    Doctor's office referred patient to [place] for [facility] services which will start within 48hrs.     Action/Plan:   CM spoke with patient. Plans are for patient to return to his home where spouse will be caregiver. He already has RW. Plans to try to find elongated 3n1 at retail store in [place].   Anticipated DC Date:  06/13/2013   Anticipated DC Plan:  HOME W HOME HEALTH SERVICES      DC Planning Services  CM consult      [facility] Choice  HOME HEALTH   Choice offered to / List presented to:  C-1 Patient        HH arranged  HH-2 PT      [facility] agency  [facility]   Status of service:  Completed, signed off Medicare Important Message given?   (If response is "NO", the following Medicare IM given date fields will be blank) Date Medicare IM given:   Date Additional Medicare IM given:    Discharge Disposition:    Per UR Regulation:  Reviewed for med. necessity/level of care/duration of stay  If discussed at Long Length of Stay Meetings, dates discussed:    Comments:

## 2013-06-15 NOTE — Discharge Summary (Signed)
Physician Discharge Summary  Patient ID: Shawn Anderson MRN: [id number] DOB/AGE: 11/28/1950 62 y.o.  Admit date: 06/12/2013 Discharge date: 06/13/2013   Procedures:  Procedure(s) (LRB): RIGHT TOTAL HIP ARTHROPLASTY ANTERIOR APPROACH (Right)  Attending Physician:  Dr. Durene Romans   Admission Diagnoses:   Right hip OA / pain  Discharge Diagnoses:  Principal Problem:   S/P right THA, AA Active Problems:   Expected blood loss anemia   Obese   Hyponatremia  Past Medical History  Diagnosis Date  . Hyperlipidemia   . Hypertension   . Gout   . Obesity   . Myocardial infarction 1997  . CAD (coronary artery disease)     seen by Dr. Eden Emms  . Arthritis   . Plantar fasciitis   . Patellar tendonitis   . Foot swelling   . History of kidney stones   . Nocturia     HPI: Shawn Anderson, 62 y.o. male, has a history of pain and functional disability in the right hip(s) due to arthritis and patient has failed non-surgical conservative treatments for greater than 12 weeks to include NSAID's and/or analgesics, supervised PT with diminished ADL's post treatment and activity modification. Onset of symptoms was gradual starting years ago with rapidlly worsening course since that time.The patient noted no past surgery on the right hip(s). Patient currently rates pain in the right hip at 5 out of 10 with activity, also having significant decrease in ROM. Patient has worsening of pain with activity and weight bearing, trendelenberg gait, pain that interfers with activities of daily living and pain with passive range of motion. Patient has evidence of periarticular osteophytes and joint space narrowing by imaging studies. This condition presents safety issues increasing the risk of falls. There is no current active infection. Risks, benefits and expectations were discussed with the patient. Risks including but not limited to the risk of anesthesia, blood clots, nerve damage, blood vessel damage,  failure of the prosthesis, infection and up to and including death. Patient understand the risks, benefits and expectations and wishes to proceed with surgery.  PCP: Thomos Lemons, DO   Discharged Condition: good  Hospital Course:  Patient underwent the above stated procedure on 06/12/2013. Patient tolerated the procedure well and brought to the recovery room in good condition and subsequently to the floor.  POD #1 BP: 109/69 ; Pulse: 64 ; Temp: 98.3 F (36.8 C) ; Resp: 16 Pt's foley was removed, as well as the hemovac drain removed. IV was changed to a saline lock. Patient reports pain as mild, pain well controlled. No events throughout the night. Ready to be discharged home. Neurovascular intact, dorsiflexion/plantar flexion intact, incision: dressing C/D/I, no cellulitis present and compartment soft.   LABS  Basename    HGB  10.0  HCT  29.3    Discharge Exam: General appearance: alert, cooperative and no distress Extremities: Homans sign is negative, no sign of DVT, no edema, redness or tenderness in the calves or thighs and no ulcers, gangrene or trophic changes  Disposition:    Home-Health Care Svc with follow up in 2 weeks   Follow-up Information   Follow up with Shelda Pal, MD. Schedule an appointment as soon as possible for a visit in 2 weeks.   Specialty:  Orthopedic Surgery   Contact information:   [address] [place] [address] [phone]       Discharge Orders   Future Orders Complete By Expires   Call MD / Call 911  As directed    Comments:     If you experience chest pain or shortness of breath, CALL 911 and be transported to the hospital emergency room.  If you develope a fever above 101 F, pus (white drainage) or increased drainage or redness at the wound, or calf pain, call your surgeon's office.   Change dressing  As directed    Comments:     Maintain surgical dressing for 10-14 days, then replace with 4x4 guaze and tape. Keep  the area dry and clean.   Constipation Prevention  As directed    Comments:     Drink plenty of fluids.  Prune juice may be helpful.  You may use a stool softener, such as Colace (over the counter) 100 mg twice a day.  Use MiraLax (over the counter) for constipation as needed.   Diet - low sodium heart healthy  As directed    Discharge instructions  As directed    Comments:     Maintain surgical dressing for 10-14 days, then replace with gauze and tape. Keep the area dry and clean until follow up. Follow up in 2 weeks at [facility]. Call with any questions or concerns.   Increase activity slowly as tolerated  As directed    TED hose  As directed    Comments:     Use stockings (TED hose) for 2 weeks on both leg(s).  You may remove them at night for sleeping.   Weight bearing as tolerated  As directed    Questions:     Laterality:     Extremity:          Medication List         aspirin 325 MG EC tablet  Take 1 tablet (325 mg total) by mouth 2 (two) times daily.     azelastine 0.05 % ophthalmic solution  Commonly known as:  OPTIVAR  Place 1 drop into both eyes 2 (two) times daily.     cetirizine 10 MG tablet  Commonly known as:  ZYRTEC  Take 10 mg by mouth every morning.     colchicine 0.6 MG tablet  Take 0.6 mg by mouth daily as needed (gout).     DSS 100 MG Caps  Take 100 mg by mouth 2 (two) times daily.     ferrous sulfate 325 (65 FE) MG tablet  Take 1 tablet (325 mg total) by mouth 3 (three) times daily after meals.     HYDROcodone-acetaminophen 7.5-325 MG per tablet  Commonly known as:  NORCO  Take 1-2 tablets by mouth every 4 (four) hours as needed for moderate pain.     indomethacin 50 MG capsule  Commonly known as:  INDOCIN  Take 50 mg by mouth as needed for mild pain.     lisinopril 40 MG tablet  Commonly known as:  PRINIVIL,ZESTRIL  take 1 tablet by mouth once daily     methocarbamol 500 MG tablet  Commonly known as:  ROBAXIN  Take 1 tablet  (500 mg total) by mouth every 6 (six) hours as needed for muscle spasms.     polyethylene glycol packet  Commonly known as:  MIRALAX / GLYCOLAX  Take 17 g by mouth daily as needed for mild constipation.     rosuvastatin 40 MG tablet  Commonly known as:  CRESTOR  Take 40 mg by mouth daily.         Signed: Anastasio Auerbach. Zebadiah Willert   PAC  06/15/2013, 1:41 PM

## 2013-07-09 ENCOUNTER — Telehealth: Payer: Self-pay | Admitting: Internal Medicine

## 2013-07-09 NOTE — Telephone Encounter (Signed)
Pt needs cpx before end of yr. Can I create 30 min slot?  °

## 2013-07-09 NOTE — Telephone Encounter (Signed)
We don't have anything and there is no where to work him in.  He will have to wait till next year sorry

## 2013-07-11 NOTE — Telephone Encounter (Signed)
Pt has been sch

## 2013-07-11 NOTE — Telephone Encounter (Signed)
Ok to work in for cpx before end of yrs per cindy. lmom for pt to callback

## 2013-08-01 ENCOUNTER — Encounter: Payer: 59 | Admitting: Internal Medicine

## 2013-08-08 ENCOUNTER — Other Ambulatory Visit: Payer: Self-pay | Admitting: Internal Medicine

## 2013-08-22 ENCOUNTER — Encounter: Payer: Self-pay | Admitting: Internal Medicine

## 2013-08-22 ENCOUNTER — Ambulatory Visit (INDEPENDENT_AMBULATORY_CARE_PROVIDER_SITE_OTHER): Payer: 59 | Admitting: Internal Medicine

## 2013-08-22 VITALS — BP 130/82 | HR 76 | Temp 98.1°F | Ht 72.75 in | Wt 268.0 lb

## 2013-08-22 DIAGNOSIS — D500 Iron deficiency anemia secondary to blood loss (chronic): Secondary | ICD-10-CM

## 2013-08-22 DIAGNOSIS — Z0000 Encounter for general adult medical examination without abnormal findings: Secondary | ICD-10-CM

## 2013-08-22 DIAGNOSIS — R7309 Other abnormal glucose: Secondary | ICD-10-CM

## 2013-08-22 DIAGNOSIS — Z125 Encounter for screening for malignant neoplasm of prostate: Secondary | ICD-10-CM

## 2013-08-22 DIAGNOSIS — I10 Essential (primary) hypertension: Secondary | ICD-10-CM

## 2013-08-22 DIAGNOSIS — E785 Hyperlipidemia, unspecified: Secondary | ICD-10-CM

## 2013-08-22 DIAGNOSIS — Z2911 Encounter for prophylactic immunotherapy for respiratory syncytial virus (RSV): Secondary | ICD-10-CM

## 2013-08-22 DIAGNOSIS — M109 Gout, unspecified: Secondary | ICD-10-CM

## 2013-08-22 DIAGNOSIS — Z23 Encounter for immunization: Secondary | ICD-10-CM

## 2013-08-22 LAB — HEPATIC FUNCTION PANEL
ALK PHOS: 86 U/L (ref 39–117)
ALT: 47 U/L (ref 0–53)
AST: 42 U/L — ABNORMAL HIGH (ref 0–37)
Albumin: 4.4 g/dL (ref 3.5–5.2)
BILIRUBIN DIRECT: 0.1 mg/dL (ref 0.0–0.3)
Total Bilirubin: 0.8 mg/dL (ref 0.3–1.2)
Total Protein: 7.8 g/dL (ref 6.0–8.3)

## 2013-08-22 LAB — BASIC METABOLIC PANEL
BUN: 18 mg/dL (ref 6–23)
CHLORIDE: 103 meq/L (ref 96–112)
CO2: 28 meq/L (ref 19–32)
CREATININE: 1 mg/dL (ref 0.4–1.5)
Calcium: 9.3 mg/dL (ref 8.4–10.5)
GFR: 84.26 mL/min (ref >60.00)
Glucose, Bld: 93 mg/dL (ref 70–99)
Potassium: 4.9 mEq/L (ref 3.5–5.1)
Sodium: 138 mEq/L (ref 135–145)

## 2013-08-22 LAB — CBC WITH DIFFERENTIAL/PLATELET
BASOS PCT: 0.8 % (ref 0.0–3.0)
Basophils Absolute: 0.1 10*3/uL (ref 0.0–0.1)
EOS ABS: 0.1 10*3/uL (ref 0.0–0.7)
Eosinophils Relative: 2 % (ref 0.0–5.0)
HCT: 36.3 % — ABNORMAL LOW (ref 39.0–52.0)
HEMOGLOBIN: 12.3 g/dL — AB (ref 13.0–17.0)
Lymphocytes Relative: 34.2 % (ref 12.0–46.0)
Lymphs Abs: 2.2 10*3/uL (ref 0.7–4.0)
MCHC: 33.9 g/dL (ref 30.0–36.0)
MCV: 90.6 fl (ref 78.0–100.0)
MONO ABS: 0.6 10*3/uL (ref 0.1–1.0)
Monocytes Relative: 9.1 % (ref 3.0–12.0)
NEUTROS ABS: 3.4 10*3/uL (ref 1.4–7.7)
NEUTROS PCT: 53.9 % (ref 43.0–77.0)
Platelets: 218 10*3/uL (ref 150.0–400.0)
RBC: 4.01 Mil/uL — ABNORMAL LOW (ref 4.22–5.81)
RDW: 15.4 % — ABNORMAL HIGH (ref 11.5–14.6)
WBC: 6.3 10*3/uL (ref 4.5–10.5)

## 2013-08-22 LAB — LIPID PANEL
CHOL/HDL RATIO: 3
Cholesterol: 165 mg/dL (ref 0–200)
HDL: 50.6 mg/dL (ref >39.00)
Triglycerides: 286 mg/dL — ABNORMAL HIGH (ref 0.0–149.0)
VLDL: 57.2 mg/dL — ABNORMAL HIGH (ref 0.0–40.0)

## 2013-08-22 LAB — LDL CHOLESTEROL, DIRECT: Direct LDL: 84.2 mg/dL

## 2013-08-22 LAB — HEMOGLOBIN A1C: Hgb A1c MFr Bld: 6 % (ref 4.6–6.5)

## 2013-08-22 LAB — TSH: TSH: 2.92 u[IU]/mL (ref 0.35–5.50)

## 2013-08-22 LAB — PSA: PSA: 0.49 ng/mL (ref 0.10–4.00)

## 2013-08-22 MED ORDER — ROSUVASTATIN CALCIUM 40 MG PO TABS: 40.0000 mg | ORAL_TABLET | Freq: Every day | ORAL | Status: DC

## 2013-08-22 MED ORDER — LISINOPRIL 40 MG PO TABS: 40.0000 mg | ORAL_TABLET | Freq: Every day | ORAL | Status: DC

## 2013-08-22 NOTE — Assessment & Plan Note (Signed)
Patient has infrequent gout flares. Observe for now.  If more frequent gout attacks, obtain uric acid level and consider starting allopurinol.

## 2013-08-22 NOTE — Assessment & Plan Note (Signed)
Reviewed adult health maintenance protocols.  Refer for screening colonoscopy.   Weight loss encouraged.  Patient also advised to decrease alcohol consumption.

## 2013-08-22 NOTE — Assessment & Plan Note (Signed)
Stable.  Continue Crestor.  Monitor FLP and LFTs.

## 2013-08-22 NOTE — Patient Instructions (Signed)
Please start diet/exercise program and work towards weight loss. Limit your carbohydrates to 30 g per meal or 90 g per day

## 2013-08-22 NOTE — Progress Notes (Signed)
Pre visit review using our clinic review tool, if applicable. No additional management support is needed unless otherwise documented below in the visit note. 

## 2013-08-22 NOTE — Assessment & Plan Note (Signed)
Well controlled. Continue same dose of Lisinopril.  BP: 130/82 mmHg  Lab Results  Component Value Date   CREATININE 1.0 08/22/2013

## 2013-08-22 NOTE — Progress Notes (Signed)
Subjective:    Patient ID: Shawn Anderson, male    DOB: [birth year], 63 y.o.   MRN: [id number]  HPI  63 year old white male with history of hypertension, hyperlipidemia, and coronary artery disease for routine physical. Interval medical history-since previous visit patient underwent right hip replacement. Procedure was uneventful. He has recovered nicely.  Fortunately he did not have any gout flare perioperatively. He rarely takes colchicine or indomethacin.  Hypertension-stable. Patient reports home blood pressure readings are well-controlled especially after his surgery.  Conjunctivitis-resolved with using Zyrtec.  His symptoms presumed secondary to allergies.  Preventative health care:  Colon cancer screening-he has never had colonoscopy in the past. He denies any stool changes. He denies any melena or hematochezia.  He has gained weight since previous visit.  He has successfully lost weight in the past following [employer].  Review of Systems   Constitutional: Negative for activity change, appetite change and unexpected weight change.  Eyes: Negative for visual disturbance.  Respiratory: Negative for cough, chest tightness and shortness of breath.   Cardiovascular: Negative for chest pain.  Genitourinary: Negative for difficulty urinating.  Neurological: Negative for headaches.  Gastrointestinal: Negative for abdominal pain, heartburn melena or hematochezia Psych: Negative for depression or anxiety Endo:  No polyuria or polydypsia        Past Medical History  Diagnosis Date  . Hyperlipidemia   . Hypertension   . Gout   . Obesity   . Myocardial infarction 1997  . CAD (coronary artery disease)     seen by Dr. Johnsie Cancel  . Arthritis   . Plantar fasciitis   . Patellar tendonitis   . Foot swelling   . History of kidney stones   . Nocturia     History   Social History  . Marital Status: Married    Spouse Name: N/A    Number of Children: N/A  . Years of  Education: N/A   Occupational History  . 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 Smoking status: Never Smoker   . Smokeless tobacco: Not on file  . Alcohol Use: 0.0 oz/week     Comment: 2 gl wine daily  . Drug Use: No  . Sexual Activity: Not on file   Other Topics Concern  . Not on file   Social History Narrative  . No narrative on file    Past Surgical History  Procedure Laterality Date  . Tonsillectomy    . Appendeceal tumor    . Hernia repair    . Coronary artery bypass graft  1997    3 VESSELS  . Total hip arthroplasty Right 06/12/2013    Procedure: RIGHT TOTAL HIP ARTHROPLASTY ANTERIOR APPROACH;  Surgeon: Mauri Pole, MD;  Location: [facility];  Service: Orthopedics;  Laterality: Right;    Family History  Problem Relation Age of Onset  . Stroke Mother   . Heart disease Mother   . Aneurysm Father     No Known Allergies  Current Outpatient Prescriptions on File Prior to Visit  Medication Sig Dispense Refill  . cetirizine (ZYRTEC) 10 MG tablet Take 10 mg by mouth every morning.      . colchicine 0.6 MG tablet Take 0.6 mg by mouth daily as needed (gout).      . indomethacin (INDOCIN) 50 MG capsule Take 50 mg by mouth as needed for mild pain.       . polyethylene glycol (MIRALAX / GLYCOLAX) packet  Take 17 g by mouth daily as needed for mild constipation.  14 each  0   No current facility-administered medications on file prior to visit.    BP 130/82  Pulse 76  Temp(Src) 98.1 F (36.7 C) (Oral)  Ht 6' 0.75" (1.848 m)  Wt 268 lb (121.564 kg)  BMI 35.60 kg/m2    Objective:   Physical Exam  Constitutional: He is oriented to person, place, and time. He appears well-developed and well-nourished. No distress.  HENT:  Head: Normocephalic and atraumatic.  Right Ear: External ear normal.  Left Ear: External ear normal.  Mouth/Throat: Oropharynx is clear and moist.  Eyes: Conjunctivae and EOM are normal. Pupils are  equal, round, and reactive to light.  Neck: Neck supple.  No carotid bruit  Cardiovascular: Normal rate, regular rhythm and normal heart sounds.   No murmur heard. Pulmonary/Chest: Effort normal and breath sounds normal. He has no wheezes.  Abdominal: Soft. Bowel sounds are normal. There is no tenderness.  Genitourinary: Rectum normal and prostate normal. Guaiac negative stool.  Musculoskeletal: Normal range of motion. He exhibits no edema.  Lymphadenopathy:    He has no cervical adenopathy.  Neurological: He is alert and oriented to person, place, and time. No cranial nerve deficit.  Skin: Skin is warm and dry.  Psychiatric: He has a normal mood and affect. His behavior is normal.          Assessment & Plan:

## 2013-08-30 ENCOUNTER — Other Ambulatory Visit: Payer: Self-pay | Admitting: Internal Medicine

## 2013-08-30 DIAGNOSIS — R7401 Elevation of levels of liver transaminase levels: Secondary | ICD-10-CM

## 2013-08-30 DIAGNOSIS — R7309 Other abnormal glucose: Secondary | ICD-10-CM

## 2013-08-30 DIAGNOSIS — R740 Nonspecific elevation of levels of transaminase and lactic acid dehydrogenase [LDH]: Secondary | ICD-10-CM

## 2013-09-05 ENCOUNTER — Telehealth: Payer: Self-pay | Admitting: Internal Medicine

## 2013-09-05 NOTE — Telephone Encounter (Signed)
Relevant patient education assigned to patient using Emmi. ° °

## 2013-09-10 ENCOUNTER — Encounter: Payer: Self-pay | Admitting: Internal Medicine

## 2013-11-05 ENCOUNTER — Ambulatory Visit (AMBULATORY_SURGERY_CENTER): Payer: Self-pay | Admitting: *Deleted

## 2013-11-05 VITALS — Ht 73.75 in | Wt 268.8 lb

## 2013-11-05 DIAGNOSIS — Z1211 Encounter for screening for malignant neoplasm of colon: Secondary | ICD-10-CM

## 2013-11-05 MED ORDER — NA SULFATE-K SULFATE-MG SULF 17.5-3.13-1.6 GM/177ML PO SOLN: 1.0000 | Freq: Once | ORAL | Status: DC

## 2013-11-05 NOTE — Progress Notes (Signed)
Denies allergies to eggs or soy products. Denies complications with sedation or anesthesia. 

## 2013-11-12 ENCOUNTER — Encounter: Payer: Self-pay | Admitting: Internal Medicine

## 2013-11-14 ENCOUNTER — Ambulatory Visit (AMBULATORY_SURGERY_CENTER): Payer: 59 | Admitting: Internal Medicine

## 2013-11-14 ENCOUNTER — Encounter: Payer: Self-pay | Admitting: Internal Medicine

## 2013-11-14 VITALS — BP 143/94 | HR 66 | Temp 97.9°F | Resp 18 | Ht 73.0 in | Wt 268.0 lb

## 2013-11-14 DIAGNOSIS — K5730 Diverticulosis of large intestine without perforation or abscess without bleeding: Secondary | ICD-10-CM

## 2013-11-14 DIAGNOSIS — D126 Benign neoplasm of colon, unspecified: Secondary | ICD-10-CM

## 2013-11-14 DIAGNOSIS — Z1211 Encounter for screening for malignant neoplasm of colon: Secondary | ICD-10-CM

## 2013-11-14 HISTORY — PX: COLONOSCOPY WITH PROPOFOL: SHX5780

## 2013-11-14 MED ORDER — SODIUM CHLORIDE 0.9 % IV SOLN: 500.0000 mL | INTRAVENOUS | Status: DC

## 2013-11-14 NOTE — Progress Notes (Signed)
A/ox3 pleased with MAC, report to Karol RN 

## 2013-11-14 NOTE — Progress Notes (Signed)
Called to room to assist during endoscopic procedure.  Patient ID and intended procedure confirmed with present staff. Received instructions for my participation in the procedure from the performing physician.  

## 2013-11-14 NOTE — Op Note (Signed)
[facility]  [address] [place], [address]   COLONOSCOPY PROCEDURE REPORT  PATIENT: Shawn Anderson, Shawn Anderson  MR#: [id number] BIRTHDATE: 03-24-1951 , 47  yrs. old GENDER: Male ENDOSCOPIST: Gatha Mayer, MD, FACG REFERRED IH:WTUUEK Yoo, DO PROCEDURE DATE:  11/14/2013 PROCEDURE:   Colonoscopy with snare polypectomy First Screening Colonoscopy - Avg.  risk and is 50 yrs.  old or older Yes.  Prior Negative Screening - Now for repeat screening. N/A  History of Adenoma - Now for follow-up colonoscopy & has been > or = to 3 yrs.  N/A  Polyps Removed Today? Yes. ASA CLASS:   Class II INDICATIONS:average risk screening and first colonoscopy. MEDICATIONS: Propofol (Diprivan) 320 mg IV, MAC sedation, administered by CRNA, and These medications were titrated to patient response per physician's verbal order  DESCRIPTION OF PROCEDURE:   After the risks benefits and alternatives of the procedure were thoroughly explained, informed consent was obtained.  A digital rectal exam revealed no abnormalities of the rectum and A digital rectal exam revealed the prostate was not enlarged.   The LB [id number]  endoscope was introduced through the anus and advanced to the surgical anastomosis. No adverse events experienced.   The quality of the prep was Suprep adequate  The instrument was then slowly withdrawn as the colon was fully examined.      COLON FINDINGS: A sessile polyp measuring 7 mm in size was found in the right colon.  A polypectomy was performed with a cold snare. The resection was complete and the polyp tissue was completely retrieved.   There was evidence of a prior ileocolonic surgical anastomosis The finding was in the right colon.   Moderate diverticulosis was noted in the sigmoid colon.   The colon mucosa was otherwise normal.  Retroflexed views revealed no abnormalities. The time to cecum=4 minutes 48 seconds.  Withdrawal time=14 minutes 44 seconds.  The  scope was withdrawn and the procedure completed. COMPLICATIONS: There were no complications.  ENDOSCOPIC IMPRESSION: 1.   Sessile polyp measuring 7 mm in size was found in the left colon; polypectomy was performed with a cold snare 2.   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   Moderate diverticulosis was noted in the sigmoid colon 4.   The colon mucosa was otherwise normal - adequate prep  RECOMMENDATIONS: Timing of repeat colonoscopy will be determined by pathology findings.   eSigned:  Gatha Mayer, MD, [facility] 11/14/2013 11:12 AM   cc: Drema Pry, DO and The Patient

## 2013-11-14 NOTE — Patient Instructions (Addendum)
I found and removed one small-medium polyp that looks benign - I removed it.  You also have a condition called diverticulosis - common and not usually a problem. Please read the handout provided.  I will let you know pathology results and when to have another routine colonoscopy by mail.  I appreciate the opportunity to care for you. Gatha Mayer, MD, FACG  YOU HAD AN ENDOSCOPIC PROCEDURE TODAY AT [facility]: Refer to the procedure report that was given to you for any specific questions about what was found during the examination.  If the procedure report does not answer your questions, please call your gastroenterologist to clarify.  If you requested that your care partner not be given the details of your procedure findings, then the procedure report has been included in a sealed envelope for you to review at your convenience later.  YOU SHOULD EXPECT: Some feelings of bloating in the abdomen. Passage of more gas than usual.  Walking can help get rid of the air that was put into your GI tract during the procedure and reduce the bloating. If you had a lower endoscopy (such as a colonoscopy or flexible sigmoidoscopy) you may notice spotting of blood in your stool or on the toilet paper. If you underwent a bowel prep for your procedure, then you may not have a normal bowel movement for a few days.  DIET: Your first meal following the procedure should be a light meal and then it is ok to progress to your normal diet.  A half-sandwich or bowl of soup is an example of a good first meal.  Heavy or fried foods are harder to digest and may make you feel nauseous or bloated.  Likewise meals heavy in dairy and vegetables can cause extra gas to form and this can also increase the bloating.  Drink plenty of fluids but you should avoid alcoholic beverages for 24 hours.  ACTIVITY: Your care partner should take you home directly after the procedure.  You should plan to take it easy, moving  slowly for the rest of the day.  You can resume normal activity the day after the procedure however you should NOT DRIVE or use heavy machinery for 24 hours (because of the sedation medicines used during the test).    SYMPTOMS TO REPORT IMMEDIATELY: A gastroenterologist can be reached at any hour.  During normal business hours, 8:30 AM to 5:00 PM Monday through Friday, call [phone].  After hours and on weekends, please call the GI answering service at [phone] who will take a message and have the physician on call contact you.   Following lower endoscopy (colonoscopy or flexible sigmoidoscopy):  Excessive amounts of blood in the stool  Significant tenderness or worsening of abdominal pains  Swelling of the abdomen that is new, acute  Fever of 100F or higher  FOLLOW UP: If any biopsies were taken you will be contacted by phone or by letter within the next 1-3 weeks.  Call your gastroenterologist if you have not heard about the biopsies in 3 weeks.  Our staff will call the home number listed on your records the next business day following your procedure to check on you and address any questions or concerns that you may have at that time regarding the information given to you following your procedure. This is a courtesy call and so if there is no answer at the home number and we have not heard from you through the emergency physician  on call, we will assume that you have returned to your regular daily activities without incident.  SIGNATURES/CONFIDENTIALITY: You and/or your care partner have signed paperwork which will be entered into your electronic medical record.  These signatures attest to the fact that that the information above on your After Visit Summary has been reviewed and is understood.  Full responsibility of the confidentiality of this discharge information lies with you and/or your care-partner.

## 2013-11-15 ENCOUNTER — Telehealth: Payer: Self-pay | Admitting: *Deleted

## 2013-11-15 NOTE — Telephone Encounter (Signed)
Patient stated no telephone number available.

## 2013-11-20 ENCOUNTER — Encounter: Payer: Self-pay | Admitting: Internal Medicine

## 2013-11-20 DIAGNOSIS — Z860100 Personal history of colon polyps, unspecified: Secondary | ICD-10-CM

## 2013-11-20 HISTORY — DX: Personal history of colon polyps, unspecified: Z86.0100

## 2013-11-20 HISTORY — DX: Personal history of colonic polyps: Z86.010

## 2013-11-20 NOTE — Progress Notes (Signed)
Quick Note:  7 mm tubular adenoma Repeat colonoscopy 2020 ______

## 2013-12-14 ENCOUNTER — Other Ambulatory Visit: Payer: 59

## 2013-12-21 ENCOUNTER — Ambulatory Visit: Payer: 59 | Admitting: Internal Medicine

## 2014-01-03 ENCOUNTER — Other Ambulatory Visit: Payer: 59

## 2014-01-14 ENCOUNTER — Ambulatory Visit: Payer: 59 | Admitting: Internal Medicine

## 2014-03-16 ENCOUNTER — Other Ambulatory Visit: Payer: Self-pay | Admitting: Internal Medicine

## 2014-06-25 ENCOUNTER — Ambulatory Visit (INDEPENDENT_AMBULATORY_CARE_PROVIDER_SITE_OTHER): Payer: 59 | Admitting: Nurse Practitioner

## 2014-06-25 VITALS — BP 140/90 | HR 83 | Temp 98.5°F | Resp 18 | Ht 72.0 in | Wt 270.0 lb

## 2014-06-25 DIAGNOSIS — J209 Acute bronchitis, unspecified: Secondary | ICD-10-CM

## 2014-06-25 MED ORDER — HYDROCODONE-HOMATROPINE 5-1.5 MG/5ML PO SYRP: 5.0000 mL | ORAL_SOLUTION | Freq: Every evening | ORAL | Status: DC | PRN

## 2014-06-25 MED ORDER — BENZONATATE 100 MG PO CAPS: ORAL_CAPSULE | ORAL | Status: DC

## 2014-06-25 NOTE — Progress Notes (Signed)
   Subjective:    Patient ID: Shawn Anderson, male    DOB: November 13, 1950, 63 y.o.   MRN: [id number]  HPI Comments: Pt mentions swollen MCP joint started yesterday. He took 1 tab indomethacin. He states he will see Dr Shawna Orleans about gout treatment.  URI  This is a new problem. The current episode started 1 to 4 weeks ago (1 week). The problem has been gradually worsening. There has been no fever. Associated symptoms include chest pain (sternal pain when coughs), congestion, coughing and joint swelling (R hand). Pertinent negatives include no ear pain, headaches, nausea, plugged ear sensation, sinus pain, sore throat, swollen glands or wheezing. Treatments tried: robitussin cough. The treatment provided mild relief.      Review of Systems  Constitutional: Positive for fatigue. Negative for fever and chills.  HENT: Positive for congestion and postnasal drip. Negative for ear pain, sinus pressure and sore throat.   Respiratory: Positive for cough. Negative for chest tightness, shortness of breath and wheezing.   Cardiovascular: Positive for chest pain (sternal pain when coughs).  Gastrointestinal: Negative for nausea.  Musculoskeletal: Positive for arthralgias (Hx gout in ankles, knees, hands). Negative for back pain.  Neurological: Negative for headaches.       Objective:   Physical Exam  Constitutional: He is oriented to person, place, and time. He appears well-developed and well-nourished. No distress.  HENT:  Head: Normocephalic and atraumatic.  Right Ear: External ear normal.  Left Ear: External ear normal.  Mouth/Throat: Oropharynx is clear and moist. No oropharyngeal exudate.  Clear fluid RTM, bones visible  Eyes: Conjunctivae are normal. Right eye exhibits no discharge. Left eye exhibits no discharge.  Neck: Normal range of motion. Neck supple. No thyromegaly present.  Cardiovascular: Normal rate, regular rhythm and normal heart sounds.   No murmur heard. Pulmonary/Chest: Effort normal  and breath sounds normal. No respiratory distress. He has no wheezes. He has no rales.  Musculoskeletal: He exhibits edema (R 3rd MCP joint).  Lymphadenopathy:    He has no cervical adenopathy.  Neurological: He is alert and oriented to person, place, and time.  Skin: Skin is warm and dry.  Psychiatric: He has a normal mood and affect. His behavior is normal. Thought content normal.  Vitals reviewed.         Assessment & Plan:  1. Acute bronchitis, unspecified organism - HYDROcodone-homatropine (HYCODAN) 5-1.5 MG/5ML syrup; Take 5 mLs by mouth at bedtime as needed for cough.  Dispense: 120 mL; Refill: 0 - benzonatate (TESSALON) 100 MG capsule; Take 1-2 capsules po up to 3 times daily PRN cough  Dispense: 60 capsule; Refill: 0 See patient instructions for complete plan. F/u PRN

## 2014-06-25 NOTE — Patient Instructions (Signed)
You have a viral illness that has caused bronchitis. You may cough for 3-4 weeks. For cough, you may use benzonatate capsules as needed for cough. Use cough syrup for night time. Start daily sinus rinses (Neilmed Sinus rinse) to decrease post-nasal drip, which can contribute to cough. Please call for re-evaluation if you are not improving or develop chest pain with inspiration or fever.  Acute Bronchitis Bronchitis is inflammation of the airways that extend from the windpipe into the lungs (bronchi). The inflammation often causes mucus to develop. This leads to a cough, which is the most common symptom of bronchitis.  In acute bronchitis, the condition usually develops suddenly and goes away over time, usually in a couple weeks. Smoking, allergies, and asthma can make bronchitis worse. Repeated episodes of bronchitis may cause further lung problems.  CAUSES Acute bronchitis is most often caused by the same virus that causes a cold. The virus can spread from person to person (contagious).  SIGNS AND SYMPTOMS   Cough.   Fever.   Coughing up mucus.   Body aches.   Chest congestion.   Chills.   Shortness of breath.   Sore throat.  DIAGNOSIS  Acute bronchitis is usually diagnosed through a physical exam. Tests, such as chest X-rays, are sometimes done to rule out other conditions.  TREATMENT  Acute bronchitis usually goes away in a couple weeks. Often times, no medical treatment is necessary. Medicines are sometimes given for relief of fever or cough. Antibiotics are usually not needed but may be prescribed in certain situations. In some cases, an inhaler may be recommended to help reduce shortness of breath and control the cough. A cool mist vaporizer may also be used to help thin bronchial secretions and make it easier to clear the chest.  HOME CARE INSTRUCTIONS  Get plenty of rest.   Drink enough fluids to keep your urine clear or pale yellow (unless you have a medical  condition that requires fluid restriction). Increasing fluids may help thin your secretions and will prevent dehydration.   Only take over-the-counter or prescription medicines as directed by your health care provider.   Avoid smoking and secondhand smoke. Exposure to cigarette smoke or irritating chemicals will make bronchitis worse. If you are a smoker, consider using nicotine gum or skin patches to help control withdrawal symptoms. Quitting smoking will help your lungs heal faster.   Reduce the chances of another bout of acute bronchitis by washing your hands frequently, avoiding people with cold symptoms, and trying not to touch your hands to your mouth, nose, or eyes.   Follow up with your health care provider as directed.  SEEK MEDICAL CARE IF: Your symptoms do not improve after 1 week of treatment.  SEEK IMMEDIATE MEDICAL CARE IF:  You develop an increased fever or chills.   You have chest pain.   You have severe shortness of breath.  You have bloody sputum.   You develop dehydration.  You develop fainting.  You develop repeated vomiting.  You develop a severe headache. MAKE SURE YOU:   Understand these instructions.  Will watch your condition.  Will get help right away if you are not doing well or get worse. Document Released: 08/26/2004 Document Revised: 03/21/2013 Document Reviewed: 01/09/2013 [facility] Patient Information 2014 [place].

## 2014-06-25 NOTE — Progress Notes (Signed)
Pre visit review using our clinic review tool, if applicable. No additional management support is needed unless otherwise documented below in the visit note. 

## 2014-09-12 ENCOUNTER — Other Ambulatory Visit: Payer: Self-pay | Admitting: Internal Medicine

## 2014-10-21 ENCOUNTER — Other Ambulatory Visit: Payer: Self-pay | Admitting: Internal Medicine

## 2014-10-23 ENCOUNTER — Telehealth: Payer: Self-pay | Admitting: Internal Medicine

## 2014-10-23 NOTE — Telephone Encounter (Signed)
Pt has cpx sch for 11-20-14. Pt needs crestor and lisinopril call into [facility] [address]

## 2014-10-25 ENCOUNTER — Other Ambulatory Visit: Payer: Self-pay | Admitting: Internal Medicine

## 2014-10-28 NOTE — Telephone Encounter (Signed)
This was done this morning  

## 2014-11-08 ENCOUNTER — Ambulatory Visit: Payer: Self-pay | Admitting: Internal Medicine

## 2014-11-13 ENCOUNTER — Other Ambulatory Visit (INDEPENDENT_AMBULATORY_CARE_PROVIDER_SITE_OTHER): Payer: Managed Care, Other (non HMO)

## 2014-11-13 DIAGNOSIS — Z0000 Encounter for general adult medical examination without abnormal findings: Secondary | ICD-10-CM | POA: Diagnosis not present

## 2014-11-13 LAB — CBC WITH DIFFERENTIAL/PLATELET
BASOS PCT: 0.5 % (ref 0.0–3.0)
Basophils Absolute: 0 10*3/uL (ref 0.0–0.1)
EOS ABS: 0.1 10*3/uL (ref 0.0–0.7)
EOS PCT: 1.2 % (ref 0.0–5.0)
HCT: 37.5 % — ABNORMAL LOW (ref 39.0–52.0)
Hemoglobin: 12.8 g/dL — ABNORMAL LOW (ref 13.0–17.0)
LYMPHS ABS: 2.1 10*3/uL (ref 0.7–4.0)
LYMPHS PCT: 38.7 % (ref 12.0–46.0)
MCHC: 34.2 g/dL (ref 30.0–36.0)
MCV: 91.7 fl (ref 78.0–100.0)
MONO ABS: 0.7 10*3/uL (ref 0.1–1.0)
MONOS PCT: 13.7 % — AB (ref 3.0–12.0)
NEUTROS ABS: 2.5 10*3/uL (ref 1.4–7.7)
NEUTROS PCT: 45.9 % (ref 43.0–77.0)
Platelets: 185 10*3/uL (ref 150.0–400.0)
RBC: 4.09 Mil/uL — AB (ref 4.22–5.81)
RDW: 14.4 % (ref 11.5–15.5)
WBC: 5.4 10*3/uL (ref 4.0–10.5)

## 2014-11-13 LAB — COMPREHENSIVE METABOLIC PANEL
ALK PHOS: 63 U/L (ref 39–117)
ALT: 39 U/L (ref 0–53)
AST: 29 U/L (ref 0–37)
Albumin: 4.1 g/dL (ref 3.5–5.2)
BUN: 17 mg/dL (ref 6–23)
CHLORIDE: 100 meq/L (ref 96–112)
CO2: 29 meq/L (ref 19–32)
Calcium: 9.3 mg/dL (ref 8.4–10.5)
Creatinine, Ser: 0.95 mg/dL (ref 0.40–1.50)
GFR: 84.94 mL/min (ref >60.00)
Glucose, Bld: 113 mg/dL — ABNORMAL HIGH (ref 70–99)
POTASSIUM: 4.8 meq/L (ref 3.5–5.1)
SODIUM: 136 meq/L (ref 135–145)
Total Bilirubin: 0.7 mg/dL (ref 0.2–1.2)
Total Protein: 7 g/dL (ref 6.0–8.3)

## 2014-11-13 LAB — LIPID PANEL
Cholesterol: 146 mg/dL (ref 0–200)
HDL: 46.5 mg/dL (ref >39.00)
LDL Cholesterol: 75 mg/dL (ref 0–99)
NONHDL: 99.5
Total CHOL/HDL Ratio: 3
Triglycerides: 124 mg/dL (ref 0.0–149.0)
VLDL: 24.8 mg/dL (ref 0.0–40.0)

## 2014-11-13 LAB — POCT URINALYSIS DIPSTICK
Bilirubin, UA: NEGATIVE
Glucose, UA: NEGATIVE
KETONES UA: NEGATIVE
Leukocytes, UA: NEGATIVE
Nitrite, UA: NEGATIVE
PROTEIN UA: NEGATIVE
RBC UA: NEGATIVE
SPEC GRAV UA: 1.02
Urobilinogen, UA: 0.2
pH, UA: 5.5

## 2014-11-13 LAB — TSH: TSH: 4.85 u[IU]/mL — ABNORMAL HIGH (ref 0.35–4.50)

## 2014-11-13 LAB — PSA: PSA: 0.56 ng/mL (ref 0.10–4.00)

## 2014-11-20 ENCOUNTER — Encounter: Payer: Self-pay | Admitting: Internal Medicine

## 2014-11-20 ENCOUNTER — Ambulatory Visit (INDEPENDENT_AMBULATORY_CARE_PROVIDER_SITE_OTHER): Payer: Managed Care, Other (non HMO) | Admitting: Internal Medicine

## 2014-11-20 VITALS — BP 130/80 | HR 96 | Temp 98.5°F | Ht 72.0 in | Wt 267.6 lb

## 2014-11-20 DIAGNOSIS — M25552 Pain in left hip: Secondary | ICD-10-CM

## 2014-11-20 DIAGNOSIS — L309 Dermatitis, unspecified: Secondary | ICD-10-CM

## 2014-11-20 DIAGNOSIS — Z0000 Encounter for general adult medical examination without abnormal findings: Secondary | ICD-10-CM | POA: Diagnosis not present

## 2014-11-20 DIAGNOSIS — I10 Essential (primary) hypertension: Secondary | ICD-10-CM | POA: Diagnosis not present

## 2014-11-20 DIAGNOSIS — E785 Hyperlipidemia, unspecified: Secondary | ICD-10-CM | POA: Diagnosis not present

## 2014-11-20 MED ORDER — ROSUVASTATIN CALCIUM 40 MG PO TABS: 40.0000 mg | ORAL_TABLET | Freq: Every day | ORAL | Status: DC

## 2014-11-20 MED ORDER — VALSARTAN 320 MG PO TABS: 320.0000 mg | ORAL_TABLET | Freq: Every day | ORAL | Status: DC

## 2014-11-20 MED ORDER — CLOTRIMAZOLE-BETAMETHASONE 1-0.05 % EX CREA: 1.0000 "application " | TOPICAL_CREAM | Freq: Two times a day (BID) | CUTANEOUS | Status: DC

## 2014-11-20 NOTE — Progress Notes (Signed)
Subjective:    Patient ID: Shawn Anderson, male    DOB: 1951/01/26, 64 y.o.   MRN: [id number]  HPI  64 year old white male with history of coronary artery disease, hypertension and hyperlipidemia presents for routine physical. Overall patient is doing well. He reports seeing his orthopedic specialist in January 2016 secondary to tight bandlike sensation near her right hip. His symptoms resolved after cortisone injection.  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 He has tried to stay physically active but does not cover the gym on a regular basis. Exercising on treadmill causes exacerbation of plantar fasciitis.  His weight is unchanged. He has not significantly decreased his alcohol intake. He still drinks 2-3 glasses of wine per night.  Hypertension-patient never switched to losartan and amlodipine. Patient reports not feeling "right" on amlodipine. He complains of chronic nonproductive cough. His symptoms started after her bout of bronchitis in the fall of 2015.  He complains of intermittent left hip pain for several months. No hx of injury or trauma.  His symptoms not exacerbated or standing or activity. His symptoms seem to be worse with laying on left side.  Review of Systems  Constitutional: Negative for activity change, appetite change and unexpected weight change.  Eyes: Negative for visual disturbance.  Respiratory: Negative for cough, chest tightness and shortness of breath.   Cardiovascular: Negative for chest pain.  Genitourinary: Negative for difficulty urinating.  Neurological: Negative for headaches.  Gastrointestinal: Negative for abdominal pain, heartburn melena or hematochezia.  Patient completed colonoscopy last year.  Small sessile polyp biopsied.  Next colonoscopy recommended in 5 years. Psych: Negative for depression or anxiety Skin: he complains of chronic rash on top of right hand        Past Medical History  Diagnosis Date  . Hyperlipidemia   .  Hypertension   . Gout   . Obesity   . Myocardial infarction 1997  . CAD (coronary artery disease)     seen by Dr. Johnsie Cancel  . Arthritis   . Plantar fasciitis   . Patellar tendonitis   . Foot swelling   . History of kidney stones   . Nocturia   . 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 Personal history of colonic polyp-adenoma  11/20/2013    History   Social History  . Marital Status: Married    Spouse Name: N/A  . Number of Children: N/A  . Years of Education: N/A   Occupational History  . 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 Smoking status: Never Smoker   . Smokeless tobacco: Never Used  . Alcohol Use: 8.4 - 12.6 oz/week    14-21 Glasses of wine per week     Comment: 2-3 gl wine daily  . Drug Use: No  . Sexual Activity: Not on file   Other Topics Concern  . Not on file   Social History Narrative    Past Surgical History  Procedure Laterality Date  . Tonsillectomy    . Hemicolectomy Right 2007    low grade mucnous appendiceal neoplasm  . Hernia repair    . Coronary artery bypass graft  1997    3 VESSELS  . 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 Stroke Mother   . Heart disease Mother   . Aneurysm Father   . Colon cancer Neg Hx   . Esophageal cancer Neg Hx   . Rectal cancer Neg Hx   . Stomach cancer Neg Hx     No Known Allergies  Current Outpatient Prescriptions on File Prior to Visit  Medication Sig Dispense Refill  . amoxicillin (AMOXIL) 500 MG capsule   0  . benzonatate (TESSALON) 100 MG capsule Take 1-2 capsules po up to 3 times daily PRN cough (Patient not taking: Reported on 11/20/2014) 60 capsule 0  . cetirizine (ZYRTEC) 10 MG tablet Take 10 mg by mouth every morning.    .  colchicine 0.6 MG tablet Take 0.6 mg by mouth daily as needed (gout).    . CRESTOR 40 MG tablet take 1 tablet by mouth once daily 30 tablet 0  . HYDROcodone-homatropine (HYCODAN) 5-1.5 MG/5ML syrup Take 5 mLs by mouth at bedtime as needed for cough. (Patient not taking: Reported on 11/20/2014) 120 mL 0  . ibuprofen (ADVIL,MOTRIN) 600 MG tablet   0  . indomethacin (INDOCIN) 25 MG capsule   0  . lisinopril (PRINIVIL,ZESTRIL) 40 MG tablet take 1 tablet by mouth once daily 30 tablet 0  . polyethylene glycol (MIRALAX / GLYCOLAX) packet Take 17 g by mouth daily as needed for mild constipation. (Patient not taking: Reported on 11/20/2014) 14 each 0   No current facility-administered medications on file prior to visit.    BP 130/80 mmHg  Pulse 96  Temp(Src) 98.5 F (36.9 C) (Oral)  Ht 6' (1.829 m)  Wt 267 lb 9.6 oz (121.383 kg)  BMI 36.29 kg/m2    Objective:   Physical Exam  Constitutional: He is oriented to person, place, and time. He appears well-developed and well-nourished. No distress.  HENT:  Head: Normocephalic and atraumatic.  Right Ear: External ear normal.  Left Ear: External ear normal.  Mouth/Throat: Oropharynx is clear and moist.  Eyes: Conjunctivae and EOM are normal. Pupils are equal, round, and reactive to light.  Neck: Neck supple.  No carotid bruit  Cardiovascular: Normal rate, regular rhythm and normal heart sounds.   No murmur heard. Pulmonary/Chest: Effort normal and breath sounds normal. He has no wheezes.  Abdominal: Soft. Bowel sounds are normal. He exhibits no distension and no mass. There is no tenderness.  Musculoskeletal:  Trace lower ext edema.  Left hip tenderness near trochanteric bursa.  No pain with internal or external rotation of left hip  Lymphadenopathy:    He has no cervical adenopathy.  Neurological: He is alert and oriented to person, place, and time. No cranial nerve deficit.  Skin: Skin is warm and dry.  Annular scaly rash dorsal aspect of  right hand  Psychiatric: He has a normal mood and affect. His behavior is normal.          Assessment & Plan:

## 2014-11-20 NOTE — Assessment & Plan Note (Signed)
LDL is at goal.  Continue Crestor. Lab Results  Component Value Date   CHOL 146 11/13/2014   HDL 46.50 11/13/2014   LDLCALC 75 11/13/2014   LDLDIRECT 84.2 08/22/2013   TRIG 124.0 11/13/2014   CHOLHDL 3 11/13/2014

## 2014-11-20 NOTE — Assessment & Plan Note (Signed)
Patient's left hip pain consistent with probable trochanteric bursitis. Patient advised to use conservative treatment (ice packs and avoid sleeping on left side). If persistent or worsening symptoms, patient will return for steroid injection.

## 2014-11-20 NOTE — Assessment & Plan Note (Signed)
Patient has scaly annular erythematous rash on top of right hand. We discussed possibility of fungal dermatitis. Treat with Lotrisone twice daily for 2 weeks.

## 2014-11-20 NOTE — Assessment & Plan Note (Addendum)
Reviewed adult health maintenance protocols.  Patient updated with Prevnar. Weight loss encouraged.  Patient advised to decrease alcohol consumption.  PSA is stable.  DRE deferred until next year.  Patient reports issues with possible dental abscess. He is planning dental surgery.  He understands to use prophylactic antibiotics before dental procedure.

## 2014-11-20 NOTE — Patient Instructions (Signed)
Please complete the following lab tests before your next follow up appointment: BMET, A1c - abnormal glucose, hypertension Use amoxicillin before your dental procedure Decrease wine consumption Start regular non weight bearing exercise program Contact our office if your left hip pain gets worse

## 2014-11-20 NOTE — Assessment & Plan Note (Signed)
Patient complains of chronic nonproductive cough.  This may be due to ACE.  Patient prescribed losartan and amlodipine by Dr. Johnsie Cancel.  He discontinued amlodipine and never continued losartan. He has not had gout flare in over 2 years. I recommend switching to valsartan 320 mg.

## 2014-11-20 NOTE — Progress Notes (Signed)
Pre visit review using our clinic review tool, if applicable. No additional management support is needed unless otherwise documented below in the visit note. 

## 2015-01-15 ENCOUNTER — Other Ambulatory Visit: Payer: Managed Care, Other (non HMO)

## 2015-02-26 ENCOUNTER — Telehealth: Payer: Self-pay | Admitting: Internal Medicine

## 2015-02-26 NOTE — Telephone Encounter (Signed)
I suggest he try switching to irbesartan 300 mg once daily

## 2015-02-26 NOTE — Telephone Encounter (Signed)
Pt call to say that his rx for valsartan is time for refill but he wanted to let you know that he has ringing in the ear since he has started taking the valsartan (DIOVAN) 320 MG tablet. He asking if this med is a probable cause for this and if so he would like to go back on lisinopril.

## 2015-02-27 MED ORDER — IRBESARTAN 300 MG PO TABS: 300.0000 mg | ORAL_TABLET | Freq: Every day | ORAL | Status: DC

## 2015-02-27 NOTE — Telephone Encounter (Signed)
Patient is aware,  Medication list corrected, Rx sent.  Patient only wanted a 30 day and will call back for refills.

## 2015-02-28 ENCOUNTER — Telehealth: Payer: Self-pay | Admitting: Internal Medicine

## 2015-02-28 ENCOUNTER — Other Ambulatory Visit: Payer: Self-pay | Admitting: *Deleted

## 2015-02-28 MED ORDER — IRBESARTAN 300 MG PO TABS: 300.0000 mg | ORAL_TABLET | Freq: Every day | ORAL | Status: DC

## 2015-02-28 NOTE — Telephone Encounter (Signed)
Rx called in. Patient aware.  

## 2015-02-28 NOTE — Telephone Encounter (Signed)
Pt needs a 90 day irbesartan (AVAPRO) 300 MG tablet Instead of 30 day.  Pt states he was asked, and told the wrong information.  Can you resend for 90 day [facility]/ [address]/ [address]

## 2015-02-28 NOTE — Telephone Encounter (Signed)
Patient called back requesting 90 day supply [place] when refill runs out. Patient states he mis-spoke when requesting 30 day supply. Reordered Rx for 90 days. Patient aware.

## 2015-04-14 NOTE — Progress Notes (Signed)
Patient ID: Shawn Anderson, male   DOB: 1950-08-05, 64 y.o.   MRN: [id number] 64 y.o.  referred by Dr Shawna Orleans in 2014   Not been not been seen regularly   CABG in 97 by Dr Cyndia Bent.  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  Activity limited by hip issues and plantar fasciatis CRF HTN and elevated lipids on Rx  Last myovue 2008 showed large inferior , inferior lateral and apical MI EF 47%  Duplex carotids normal in 2008 as well   Weight up inactive from left hip pain.  Getting some exertional dyspnea and throat tightness since March  Not like his angina pre CABG but reproducible With activity   ROS: Denies fever, malais, weight loss, blurry vision, decreased visual acuity, cough, sputum, SOB, hemoptysis, pleuritic pain, palpitaitons, heartburn, abdominal pain, melena, lower extremity edema, claudication, or rash.  All other systems reviewed and negative   General: Affect appropriate Healthy:  appears stated age 27: normal Neck supple with no adenopathy JVP normal no bruits no thyromegaly Lungs clear with no wheezing and good diaphragmatic motion Heart:  S1/S2 no murmur,rub, gallop or click PMI normal Abdomen: benighn, BS positve, no tenderness, no AAA no bruit.  No HSM or HJR Distal pulses intact with no bruits No edema Neuro non-focal Skin warm and dry No muscular weakness  Medications Current Outpatient Prescriptions  Medication Sig Dispense Refill  . amoxicillin (AMOXIL) 500 MG capsule   0  . benzonatate (TESSALON) 100 MG capsule Take 1-2 capsules po up to 3 times daily PRN cough (Patient not taking: Reported on 11/20/2014) 60 capsule 0  . cetirizine (ZYRTEC) 10 MG tablet Take 10 mg by mouth every morning.    . clotrimazole-betamethasone (LOTRISONE) cream Apply 1 application topically 2 (two) times daily. 45 g 0  . colchicine 0.6 MG tablet Take 0.6 mg by mouth daily as needed (gout).    . indomethacin (INDOCIN) 25 MG capsule   0  . irbesartan (AVAPRO) 300 MG tablet Take 1  tablet (300 mg total) by mouth daily. 90 tablet 0  . polyethylene glycol (MIRALAX / GLYCOLAX) packet Take 17 g by mouth daily as needed for mild constipation. (Patient not taking: Reported on 11/20/2014) 14 each 0  . rosuvastatin (CRESTOR) 40 MG tablet Take 1 tablet (40 mg total) by mouth daily. 90 tablet 3   No current facility-administered medications for this visit.    Allergies Review of patient's allergies indicates no known allergies.  Family History: Family History  Problem Relation Age of Onset  . Stroke Mother   . Heart disease Mother   . Aneurysm Father   . Colon cancer Neg Hx   . Esophageal cancer Neg Hx   . Rectal cancer Neg Hx   . Stomach cancer Neg Hx     Social History: Social History   Social History  . Marital Status: Married    Spouse Name: N/A  . Number of Children: N/A  . Years of Education: N/A   Occupational History  . 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 Smoking status: Never Smoker   . Smokeless tobacco: Never Used  . Alcohol Use: 8.4 - 12.6 oz/week    14-21 Glasses of wine per week     Comment: 2-3 gl wine daily  . Drug Use: No  . Sexual Activity: Not on file   Other Topics Concern  . Not on file   Social History Narrative  Retired Patent attorney    Electrocardiogram: 08/03/12 SR rate 61 old IMI poor R wave progression  04/17/15  SR old IMI normal ST segment  Rate 68   Assessment and Plan  CAD/CABG with exertional throat tightness and dyspnea  Unable to walk on treadmill due to hips  F/U Lexiscan Myvue  Nitro called in Obesity.  Discussed low carb diet and non weight bearing exercise like swimming Ortho:  F/U Alvan Dame consider repeat cortisone injection Chol:  On statin labs with Dr Tasia Catchings HTN:  Well controlled.  Continue current medications and low sodium Dash type diet.    F/U with me in 6 months if myovue normal

## 2015-04-17 ENCOUNTER — Encounter: Payer: Self-pay | Admitting: Cardiovascular Disease

## 2015-04-17 ENCOUNTER — Ambulatory Visit (INDEPENDENT_AMBULATORY_CARE_PROVIDER_SITE_OTHER): Payer: Managed Care, Other (non HMO) | Admitting: Cardiovascular Disease

## 2015-04-17 VITALS — BP 150/100 | HR 68 | Ht 72.0 in | Wt 278.6 lb

## 2015-04-17 DIAGNOSIS — I208 Other forms of angina pectoris: Secondary | ICD-10-CM | POA: Diagnosis not present

## 2015-04-17 NOTE — Patient Instructions (Signed)
Medication Instructions:  Your physician recommends that you continue on your current medications as directed. Please refer to the Current Medication list given to you today.   Labwork: NONE  Testing/Procedures: Your physician has requested that you have a lexiscan myoview. For further information please visit [email or web address]. Please follow instruction sheet, as given.   Follow-Up: Your physician wants you to follow-up in:  [place] will receive a reminder letter in the mail two months in advance. If you don't receive a letter, please call our office to schedule the follow-up appointment.   Any Other Special Instructions Will Be Listed Below (If Applicable).

## 2015-04-18 ENCOUNTER — Telehealth: Payer: Self-pay

## 2015-04-18 MED ORDER — NITROGLYCERIN 0.4 MG SL SUBL: 0.4000 mg | SUBLINGUAL_TABLET | SUBLINGUAL | Status: DC | PRN

## 2015-04-18 NOTE — Telephone Encounter (Signed)
Patient called in about getting new medication from Dr. Birdie Hopes. He stated he was told it would be sent into [facility] on [address].

## 2015-04-24 ENCOUNTER — Telehealth (HOSPITAL_COMMUNITY): Payer: Self-pay | Admitting: *Deleted

## 2015-04-24 ENCOUNTER — Encounter: Payer: Self-pay | Admitting: Cardiovascular Disease

## 2015-04-24 NOTE — Telephone Encounter (Signed)
Patient given detailed instructions per Myocardial Perfusion Study Information Sheet for test on 04/29/15 at 830. Patient notified to arrive 15 minutes early and that it is imperative to arrive on time for appointment to keep from having the test rescheduled.  If you need to cancel or reschedule your appointment, please call the office within 24 hours of your appointment. Failure to do so may result in a cancellation of your appointment, and a $50 no show fee. Patient verbalized understanding. Hubbard Robinson, RN

## 2015-04-29 ENCOUNTER — Ambulatory Visit (HOSPITAL_COMMUNITY): Payer: Managed Care, Other (non HMO) | Attending: Cardiovascular Disease

## 2015-04-29 DIAGNOSIS — R42 Dizziness and giddiness: Secondary | ICD-10-CM | POA: Insufficient documentation

## 2015-04-29 DIAGNOSIS — R9439 Abnormal result of other cardiovascular function study: Secondary | ICD-10-CM | POA: Insufficient documentation

## 2015-04-29 DIAGNOSIS — I779 Disorder of arteries and arterioles, unspecified: Secondary | ICD-10-CM | POA: Insufficient documentation

## 2015-04-29 DIAGNOSIS — I10 Essential (primary) hypertension: Secondary | ICD-10-CM | POA: Insufficient documentation

## 2015-04-29 DIAGNOSIS — I208 Other forms of angina pectoris: Secondary | ICD-10-CM

## 2015-04-29 LAB — MYOCARDIAL PERFUSION IMAGING
CHL CUP NUCLEAR SDS: 2
CHL CUP NUCLEAR SRS: 20
CHL CUP STRESS STAGE 1 SBP: 176 mmHg
CHL CUP STRESS STAGE 2 SPEED: 0 mph
CHL CUP STRESS STAGE 3 DBP: 104 mmHg
CHL CUP STRESS STAGE 3 GRADE: 0 %
CHL CUP STRESS STAGE 3 HR: 73 {beats}/min
CHL CUP STRESS STAGE 3 SPEED: 0 mph
CHL CUP STRESS STAGE 4 GRADE: 0 %
CHL CUP STRESS STAGE 4 HR: 85 {beats}/min
CHL CUP STRESS STAGE 4 SPEED: 0 mph
CHL CUP STRESS STAGE 5 DBP: 105 mmHg
CHL CUP STRESS STAGE 6 SPEED: 0 mph
Estimated workload: 1 METS
LHR: 0.41
LV dias vol: 220 mL
LV sys vol: 138 mL
Peak HR: 85 {beats}/min
Percent of predicted max HR: 54 %
Rest HR: 66 {beats}/min
SSS: 22
Stage 1 DBP: 111 mmHg
Stage 1 Grade: 0 %
Stage 1 HR: 64 {beats}/min
Stage 1 Speed: 0 mph
Stage 2 Grade: 0 %
Stage 2 HR: 64 {beats}/min
Stage 3 SBP: 156 mmHg
Stage 5 Grade: 0 %
Stage 5 HR: 80 {beats}/min
Stage 5 SBP: 156 mmHg
Stage 5 Speed: 0 mph
Stage 6 Grade: 0 %
Stage 6 HR: 71 {beats}/min
TID: 1.16

## 2015-04-29 MED ORDER — TECHNETIUM TC 99M SESTAMIBI GENERIC - CARDIOLITE
32.4000 | Freq: Once | INTRAVENOUS | Status: AC | PRN
  Administered 2015-04-29: 32 via INTRAVENOUS

## 2015-04-29 MED ORDER — TECHNETIUM TC 99M SESTAMIBI GENERIC - CARDIOLITE
10.4000 | Freq: Once | INTRAVENOUS | Status: AC | PRN
  Administered 2015-04-29: 10 via INTRAVENOUS

## 2015-04-29 MED ORDER — REGADENOSON 0.4 MG/5ML IV SOLN
0.4000 mg | Freq: Once | INTRAVENOUS | Status: AC
  Administered 2015-04-29: 0.4 mg via INTRAVENOUS

## 2015-05-01 ENCOUNTER — Other Ambulatory Visit: Payer: Self-pay | Admitting: *Deleted

## 2015-05-01 ENCOUNTER — Telehealth: Payer: Self-pay | Admitting: Cardiovascular Disease

## 2015-05-01 DIAGNOSIS — Z79899 Other long term (current) drug therapy: Secondary | ICD-10-CM

## 2015-05-01 DIAGNOSIS — R931 Abnormal findings on diagnostic imaging of heart and coronary circulation: Secondary | ICD-10-CM

## 2015-05-01 NOTE — Telephone Encounter (Signed)
PT TALKED TO [place], [place] CALL [phone]

## 2015-05-01 NOTE — Telephone Encounter (Signed)
PT HAS  A LOT OF QUESTIONS  :  POTASSIUM  IS  BORDERLINE NORMAL HIGH  PT  HAS  CONCERNS RE   TAKING IRBESARTAN  CAN AFFECT   K  LEVEL . ALSO  HAS  HAD  TITANIUM  HIP REPLACEMENT  DID NOT  KNOW  IF  STILL  COULD HAVE  MRI AND  LAST  QUESTION PT  CONTINUES  TO C/O PAIN  ACROSS  SHOULDERS  WITH  WEAKNESS  NOT  SURE CAUSE   AWARE WILL  FORWARD TO DR Bressler  REVIEW .Adonis Housekeeper

## 2015-05-01 NOTE — Telephone Encounter (Signed)
PT  NOTIFIED  WILL HAVE BMET NEXT  WEEK .Shawn Anderson

## 2015-05-01 NOTE — Telephone Encounter (Signed)
K is normal will recheck 4 weeks after starting cozaar.  Needs ARB to help with low EF Titanium hip is ok for MRI it is a non ferrous metal

## 2015-05-01 NOTE — Telephone Encounter (Signed)
DISCUSSED WITH DR Johnsie Cancel  PT   MAY  STAY  ON AVAPRO  AND  CHECK  BMET THIS  WEEK  .Adonis Housekeeper

## 2015-05-06 ENCOUNTER — Encounter: Payer: Self-pay | Admitting: Cardiovascular Disease

## 2015-05-08 ENCOUNTER — Other Ambulatory Visit (INDEPENDENT_AMBULATORY_CARE_PROVIDER_SITE_OTHER): Payer: Managed Care, Other (non HMO) | Admitting: *Deleted

## 2015-05-08 DIAGNOSIS — Z79899 Other long term (current) drug therapy: Secondary | ICD-10-CM

## 2015-05-08 LAB — BASIC METABOLIC PANEL
BUN: 16 mg/dL (ref 7–25)
CHLORIDE: 96 mmol/L — AB (ref 98–110)
CO2: 27 mmol/L (ref 20–31)
Calcium: 9.2 mg/dL (ref 8.6–10.3)
Creat: 0.76 mg/dL (ref 0.70–1.25)
Glucose, Bld: 103 mg/dL — ABNORMAL HIGH (ref 65–99)
POTASSIUM: 4.6 mmol/L (ref 3.5–5.3)
SODIUM: 133 mmol/L — AB (ref 135–146)

## 2015-05-22 ENCOUNTER — Ambulatory Visit (HOSPITAL_COMMUNITY)
Admission: RE | Admit: 2015-05-22 | Discharge: 2015-05-22 | Disposition: A | Discharge status: Home / Self Care | Payer: Managed Care, Other (non HMO) | Source: Ambulatory Visit | Attending: Cardiovascular Disease | Admitting: Cardiovascular Disease

## 2015-05-22 DIAGNOSIS — I255 Ischemic cardiomyopathy: Secondary | ICD-10-CM | POA: Diagnosis not present

## 2015-05-22 DIAGNOSIS — R931 Abnormal findings on diagnostic imaging of heart and coronary circulation: Secondary | ICD-10-CM

## 2015-05-22 MED ORDER — ALBUTEROL SULFATE HFA 108 (90 BASE) MCG/ACT IN AERS
INHALATION_SPRAY | RESPIRATORY_TRACT | Status: DC
Start: 2015-05-22 — End: 2015-05-23
  Filled 2015-05-22: qty 6.7

## 2015-05-22 MED ORDER — DIPHENHYDRAMINE HCL 50 MG/ML IJ SOLN
INTRAMUSCULAR | Status: AC
  Filled 2015-05-22: qty 1

## 2015-05-22 MED ORDER — DIPHENHYDRAMINE HCL 50 MG/ML IJ SOLN
50.0000 mg | Freq: Once | INTRAMUSCULAR | Status: AC
  Administered 2015-05-22: 50 mg via INTRAVENOUS
  Filled 2015-05-22: qty 1

## 2015-05-22 MED ORDER — GADOBENATE DIMEGLUMINE 529 MG/ML IV SOLN
35.0000 mL | Freq: Once | INTRAVENOUS | Status: AC
  Administered 2015-05-22: 35 mL via INTRAVENOUS

## 2015-05-22 MED ORDER — METHYLPREDNISOLONE SODIUM SUCC 125 MG IJ SOLR
INTRAMUSCULAR | Status: AC
  Filled 2015-05-22: qty 2

## 2015-05-22 MED ORDER — ALBUTEROL SULFATE HFA 108 (90 BASE) MCG/ACT IN AERS
2.0000 | INHALATION_SPRAY | Freq: Once | RESPIRATORY_TRACT | Status: AC
  Administered 2015-05-22: 2 via RESPIRATORY_TRACT

## 2015-05-22 MED ORDER — METHYLPREDNISOLONE SODIUM SUCC 125 MG IJ SOLR
125.0000 mg | Freq: Once | INTRAMUSCULAR | Status: AC
  Administered 2015-05-22: 125 mg via INTRAVENOUS
  Filled 2015-05-22: qty 2

## 2015-05-22 NOTE — Progress Notes (Signed)
Symptoms resolving, throat still feels like it has phlegm in it, much improved, nose less stuffy.  VSS. Wife at side, updated on events.

## 2015-05-22 NOTE — Progress Notes (Signed)
Called emergently by MRI staff for pt for allergic reaction.  Pt reports immediately feeling throat numbness and inability to swallow, numbness and nasal congestion feeling like nose closing. Pt alert and oriented sitting in chair.   O2 sat on sat 98% HR 86.  Benadryl and solumedrol pulled from pyxis, upon arrival of Dr Candise Che Benadryl 50 mg IV, solumedrol 125 mg IV and albuterol inhaler 2 puffs given.

## 2015-05-22 NOTE — Progress Notes (Signed)
Dr Candise Che in to see pt post allergic reaction.  Symptoms almost completely resolved states feels slight throat hoarseness.  Ok to D/C per MD.  VSS.  IV d/c'd.

## 2015-05-27 ENCOUNTER — Telehealth: Payer: Self-pay | Admitting: Cardiovascular Disease

## 2015-05-27 NOTE — Telephone Encounter (Signed)
New message     Calling for test results (cardiac MRI) Pt is due to have dental work (pull back gum and scrap infection)--what kind of an anesthesia will Dr Johnsie Cancel recommend because of his history.  Dentist office faxed form to [place].

## 2015-05-27 NOTE — Telephone Encounter (Signed)
Dr. Johnsie Cancel advised to avoid epinephrine. Will fax Dental office note.

## 2015-05-27 NOTE — Telephone Encounter (Signed)
Clarification: Is it okay for patient to have epinephrine during dental procedure?

## 2015-05-27 NOTE — Telephone Encounter (Signed)
MRI showed inferior scar EF 43% no need for AICD Myovue was non ischemic ok to have dental work No need for SBE

## 2015-05-27 NOTE — Telephone Encounter (Signed)
Called patient about his message below. Informed patient that cardiac MRI result is not available (no result note at this time). Patient verbalized understanding.   Patient has two questions.  1) Patient is having dental work (periodontal surgery) on Tuesday. Patient stated he was told to reschedule this earlier this year, so now patient wants to know if he is cleared to have this procedure done.  2) Patient is worried about his options for anesthesia epinephrine with xylocaine or xylocaine with no epinephrine. Patient wants to know which one they can use on him.    Patient stated his dental insurance runs our at the end of December, so he prefers it if he does not have to reschedule. Dentist is Dr. Loyal Gambler [phone] and [phone] (fax). Dentist office would like to have fax sent to them if patient is cleared to have procedure.

## 2015-05-29 NOTE — Progress Notes (Signed)
ERROR

## 2015-06-03 ENCOUNTER — Other Ambulatory Visit: Payer: Self-pay | Admitting: Internal Medicine

## 2015-12-11 ENCOUNTER — Telehealth: Payer: Self-pay | Admitting: General Practice

## 2015-12-12 MED ORDER — IRBESARTAN 300 MG PO TABS: 300.0000 mg | ORAL_TABLET | Freq: Every day | ORAL | Status: DC

## 2015-12-12 MED ORDER — ROSUVASTATIN CALCIUM 40 MG PO TABS: 40.0000 mg | ORAL_TABLET | Freq: Every day | ORAL | Status: DC

## 2015-12-12 NOTE — Telephone Encounter (Signed)
Ok RF x 2.  He needs to establish with new provider

## 2015-12-12 NOTE — Telephone Encounter (Signed)
Ok to RF x 2.  He needs to establish with new provider

## 2016-05-07 ENCOUNTER — Ambulatory Visit: Payer: Managed Care, Other (non HMO) | Admitting: Cardiovascular Disease

## 2016-06-17 DIAGNOSIS — R69 Illness, unspecified: Secondary | ICD-10-CM | POA: Diagnosis not present

## 2016-06-26 ENCOUNTER — Other Ambulatory Visit: Payer: Self-pay | Admitting: Internal Medicine

## 2016-06-30 ENCOUNTER — Telehealth: Payer: Self-pay | Admitting: Family Medicine

## 2016-06-30 NOTE — Telephone Encounter (Signed)
Patient came in today regarding his RX refill for irbesartan (AVAPRO) 300 MG tablet patient states that the RX refill was denied due to not having a PCP appointment. Patient is schedule to have a appointment with Dr. Yong Channel on 09/27/2016.  Contact Info:

## 2016-07-01 ENCOUNTER — Other Ambulatory Visit: Payer: Self-pay

## 2016-07-01 MED ORDER — IRBESARTAN 300 MG PO TABS
300.0000 mg | ORAL_TABLET | Freq: Every day | ORAL | 1 refills | Status: DC
Start: 2016-07-01 — End: 2016-09-02

## 2016-07-01 NOTE — Telephone Encounter (Signed)
Prescription sent to pharmacy.

## 2016-07-12 ENCOUNTER — Other Ambulatory Visit: Payer: Self-pay | Admitting: Internal Medicine

## 2016-07-14 ENCOUNTER — Other Ambulatory Visit: Payer: Self-pay | Admitting: Family Medicine

## 2016-07-14 ENCOUNTER — Telehealth: Payer: Self-pay | Admitting: Cardiovascular Disease

## 2016-07-14 DIAGNOSIS — Z79899 Other long term (current) drug therapy: Secondary | ICD-10-CM

## 2016-07-14 NOTE — Telephone Encounter (Signed)
Called patient and made lab appointment to have BMET, Lipid and liver panel before his appointment in February.

## 2016-07-14 NOTE — Telephone Encounter (Signed)
Should have BMET lipid and liver on statin and ARB

## 2016-07-14 NOTE — Telephone Encounter (Signed)
° ° ° ° °  Pt is set to establish with Dr Yong Channel in Feb. 2018 and is asking if he will refill the following med until he see Dr Yong Channel.   rosuvastatin (CRESTOR) 40 MG tablet    [employer]

## 2016-07-14 NOTE — Telephone Encounter (Signed)
Pt hasn't been seen in a while and changed PCP because his Dr. retired and can't get an appt until Feb, wants to know if he needs lab work?

## 2016-08-18 ENCOUNTER — Other Ambulatory Visit: Payer: Managed Care, Other (non HMO)

## 2016-08-25 ENCOUNTER — Other Ambulatory Visit: Payer: Medicare HMO

## 2016-08-25 DIAGNOSIS — E7800 Pure hypercholesterolemia, unspecified: Secondary | ICD-10-CM | POA: Diagnosis not present

## 2016-08-25 DIAGNOSIS — I10 Essential (primary) hypertension: Secondary | ICD-10-CM

## 2016-08-25 NOTE — Progress Notes (Signed)
Patient ID: Shawn Anderson, male   DOB: 1951-01-15, 66 y.o.   MRN: [id number] 66 y.o.  referred by Dr Shawna Orleans in 2014   Not been not been seen regularly   CABG in 97 by Dr Cyndia Bent.  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  Activity limited by hip issues and plantar fasciatis  CRF HTN and elevated lipids on Rx  Last myovue 04/29/15  EF 37%  Inferior scar no ischemia   MRI 05/22/15 EF 42% inferior thinning and hypokinesis of note had reaction to gadolinium with hives and pruritis no delayed gad images done  Has gained 10 lbs over winter. Drinking too much wine at least a bottle / day NOt exercising BP is up No chest pain   ROS: Denies fever, malais, weight loss, blurry vision, decreased visual acuity, cough, sputum, SOB, hemoptysis, pleuritic pain, palpitaitons, heartburn, abdominal pain, melena, lower extremity edema, claudication, or rash.  All other systems reviewed and negative   General: Affect appropriate Obese white male  HEENT: normal Neck supple with no adenopathy JVP normal no bruits no thyromegaly Lungs clear with no wheezing and good diaphragmatic motion Heart:  S1/S2 no murmur,rub, gallop or click PMI normal Abdomen: benighn, BS positve, no tenderness, no AAA no bruit.  No HSM or HJR Distal pulses intact with no bruits Plus 2 RLE edema varicosities Plus 1 LLE post venectomy  Neuro non-focal Skin warm and dry No muscular weakness  Medications Current Outpatient Prescriptions  Medication Sig Dispense Refill  . amoxicillin (AMOXIL) 500 MG capsule   0  . amoxicillin (AMOXIL) 500 MG capsule Take 500 mg by mouth 3 (three) times daily. Pt takes for dental appts    . indomethacin (INDOCIN) 25 MG capsule Take 25 mg by mouth 3 (three) times daily as needed for mild pain.    . nitroGLYCERIN (NITROSTAT) 0.4 MG SL tablet Place 1 tablet (0.4 mg total) under the tongue every 5 (five) minutes as needed. 25 tablet 3  . rosuvastatin (CRESTOR) 40 MG tablet take 1 tablet by mouth once daily 90 tablet 1   . carvedilol (COREG) 3.125 MG tablet Take 1 tablet (3.125 mg total) by mouth 2 (two) times daily with a meal. 180 tablet 3  . losartan-hydrochlorothiazide (HYZAAR) 100-25 MG tablet Take 1 tablet by mouth daily. 90 tablet 3   No current facility-administered medications for this visit.     Allergies Gadolinium derivatives  Family History: Family History  Problem Relation Age of Onset  . Stroke Mother   . Heart disease Mother   . Aneurysm Father   . Colon cancer Neg Hx   . Esophageal cancer Neg Hx   . Rectal cancer Neg Hx   . Stomach cancer Neg Hx     Social History: Social History   Social History  . Marital status: Married    Spouse name: N/A  . Number of children: N/A  . Years of education: N/A   Occupational History  . 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 Smoking status: Never Smoker  . Smokeless tobacco: Never Used  . Alcohol use 8.4 - 12.6 oz/week    14 - 21 Glasses of wine per week     Comment: 2-3 gl wine daily  . Drug use: No  . Sexual activity: Not on file   Other Topics Concern  . Not on file   Social History Narrative   Retired Patent attorney    Electrocardiogram: 08/03/12  SR rate 66 old IMI poor R wave progression  04/17/15  SR old IMI normal ST segment  Rate 68  09/02/16 SR rate 6 old ILMI   Assessment and Plan  CAD/CABG: distant last myovue 2016 non ischemic EF by MRI 42% on ARB    Obesity.  Discussed low carb diet and non weight bearing exercise like swimming Ortho:  F/U Alvan Dame consider repeat cortisone injection Chol:  On statin labs with Dr Tasia Catchings HTN:  Add diuretic and beta blocker given elevated BP, CAD and decreased EF  F/U PA/me 6 weeks with BMET  Long discussion about lifestyle changes decreasing ETOH intake and taking BP at home      [employer]

## 2016-08-26 LAB — BASIC METABOLIC PANEL
BUN/Creatinine Ratio: 20 (ref 10–24)
BUN: 17 mg/dL (ref 8–27)
CO2: 24 mmol/L (ref 18–29)
Calcium: 9.4 mg/dL (ref 8.6–10.2)
Chloride: 95 mmol/L — ABNORMAL LOW (ref 96–106)
Creatinine, Ser: 0.87 mg/dL (ref 0.76–1.27)
GFR calc Af Amer: 105 mL/min/{1.73_m2} (ref >59)
GFR, EST NON AFRICAN AMERICAN: 91 mL/min/{1.73_m2} (ref >59)
Glucose: 107 mg/dL — ABNORMAL HIGH (ref 65–99)
POTASSIUM: 4.7 mmol/L (ref 3.5–5.2)
Sodium: 137 mmol/L (ref 134–144)

## 2016-08-26 LAB — LIPID PANEL
CHOL/HDL RATIO: 3.7 ratio (ref 0.0–5.0)
Cholesterol, Total: 181 mg/dL (ref 100–199)
HDL: 49 mg/dL (ref >39)
LDL Calculated: 92 mg/dL (ref 0–99)
TRIGLYCERIDES: 202 mg/dL — AB (ref 0–149)
VLDL CHOLESTEROL CAL: 40 mg/dL (ref 5–40)

## 2016-08-26 LAB — HEPATIC FUNCTION PANEL
ALK PHOS: 77 IU/L (ref 39–117)
ALT: 48 IU/L — AB (ref 0–44)
AST: 46 IU/L — ABNORMAL HIGH (ref 0–40)
Albumin: 4.4 g/dL (ref 3.6–4.8)
BILIRUBIN, DIRECT: 0.19 mg/dL (ref 0.00–0.40)
Bilirubin Total: 0.7 mg/dL (ref 0.0–1.2)
Total Protein: 7.4 g/dL (ref 6.0–8.5)

## 2016-09-02 ENCOUNTER — Encounter: Payer: Self-pay | Admitting: Cardiovascular Disease

## 2016-09-02 ENCOUNTER — Ambulatory Visit (INDEPENDENT_AMBULATORY_CARE_PROVIDER_SITE_OTHER): Payer: Medicare HMO | Admitting: Cardiovascular Disease

## 2016-09-02 VITALS — BP 170/100 | HR 85 | Ht 74.0 in | Wt 285.6 lb

## 2016-09-02 DIAGNOSIS — Z79899 Other long term (current) drug therapy: Secondary | ICD-10-CM | POA: Diagnosis not present

## 2016-09-02 MED ORDER — LOSARTAN POTASSIUM-HCTZ 100-25 MG PO TABS: 1.0000 | ORAL_TABLET | Freq: Every day | ORAL | 3 refills | Status: DC

## 2016-09-02 MED ORDER — CARVEDILOL 3.125 MG PO TABS: 3.1250 mg | ORAL_TABLET | Freq: Two times a day (BID) | ORAL | 3 refills | Status: DC

## 2016-09-02 NOTE — Patient Instructions (Addendum)
Medication Instructions:  Your physician has recommended you make the following change in your medication:  1-START Coreg 3.125 mg by mouth twice daily 2-START Hyzaar 100/25 mg by mouth daily 3-STOP Avapro  Labwork: NONE  Testing/Procedures: NONE  Follow-Up: Your physician wants you to follow-up in: 6 weeks with with Dr. Johnsie Cancel, PA, or BP clinic.   If you need a refill on your cardiac medications before your next appointment, please call your pharmacy.

## 2016-09-03 ENCOUNTER — Telehealth: Payer: Self-pay | Admitting: Cardiovascular Disease

## 2016-09-03 NOTE — Telephone Encounter (Signed)
Patient wanted to wait to start his new medications after his long drive to [place] for vacation on Monday. Patient is afraid he will get dizzy with new medications, carvedilol and hyzaar. Encouraged patient to start medications this weekend to see if he has any side effects. Informed patient if he starts to feel dizzy to take his BP and HR. Encouraged patient to call our office if he has any problems with the new medications, that someone is usually on call. Patient stated he would try the medications tomorrow.

## 2016-09-03 NOTE — Telephone Encounter (Signed)
New Message  Pt voiced needing nurse to return his call about his medications that's not in pts chart.  Losartan and carvedilol.  Please f/u

## 2016-09-27 ENCOUNTER — Ambulatory Visit (INDEPENDENT_AMBULATORY_CARE_PROVIDER_SITE_OTHER): Payer: Medicare HMO | Admitting: Family Medicine

## 2016-09-27 ENCOUNTER — Encounter: Payer: Self-pay | Admitting: Family Medicine

## 2016-09-27 VITALS — BP 138/88 | HR 86 | Temp 98.2°F | Ht 73.25 in | Wt 282.6 lb

## 2016-09-27 DIAGNOSIS — R7401 Elevation of levels of liver transaminase levels: Secondary | ICD-10-CM

## 2016-09-27 DIAGNOSIS — Z23 Encounter for immunization: Secondary | ICD-10-CM

## 2016-09-27 DIAGNOSIS — M722 Plantar fascial fibromatosis: Secondary | ICD-10-CM

## 2016-09-27 DIAGNOSIS — R740 Nonspecific elevation of levels of transaminase and lactic acid dehydrogenase [LDH]: Secondary | ICD-10-CM

## 2016-09-27 DIAGNOSIS — E7800 Pure hypercholesterolemia, unspecified: Secondary | ICD-10-CM | POA: Diagnosis not present

## 2016-09-27 DIAGNOSIS — M1A00X0 Idiopathic chronic gout, unspecified site, without tophus (tophi): Secondary | ICD-10-CM

## 2016-09-27 DIAGNOSIS — R7402 Elevation of levels of lactic acid dehydrogenase (LDH): Secondary | ICD-10-CM

## 2016-09-27 DIAGNOSIS — I10 Essential (primary) hypertension: Secondary | ICD-10-CM | POA: Diagnosis not present

## 2016-09-27 NOTE — Assessment & Plan Note (Signed)
S: reasonaly controlled on crestor 40mg  though would prefer LDL under 70. No myalgias.  Lab Results  Component Value Date   CHOL 181 08/25/2016   HDL 49 08/25/2016   LDLCALC 92 08/25/2016   LDLDIRECT 84.2 08/22/2013   TRIG 202 (H) 08/25/2016   CHOLHDL 3.7 08/25/2016   A/P: continue crestor 40mg , we discussed working on weight loss. If does not make progress consider zetia?

## 2016-09-27 NOTE — Assessment & Plan Note (Signed)
S: controlled on repeat on losartan hctz 100-25mg , coreg 3.125mg  BID (took first dose this AM).  BP Readings from Last 3 Encounters:  09/27/16 138/88  09/02/16 (!) 170/100  05/22/15 138/76  A/P:Continue current meds:  Doing much better than last measure and follows up with cardiology as well for this

## 2016-09-27 NOTE — Assessment & Plan Note (Signed)
S: often 3 glasses of wine a note. Per prior note- mention of fatty liver on CT Lab Results  Component Value Date   ALT 48 (H) 08/25/2016   AST 46 (H) 08/25/2016   ALKPHOS 77 08/25/2016   BILITOT 0.7 08/25/2016  A/P: advised 0 alcohol idea, 1 if absolutely must. Needs weight loss. Continue to trend.

## 2016-09-27 NOTE — Assessment & Plan Note (Signed)
S:Usually less than once a year. hctz risk but started by cardiology due to CAD being greater risk to long term health.  Did not want to take allopurinol. Often right ankle bothered A/P: may need to readdress need for allopurinol if future if starts to flare again on hctz.

## 2016-09-27 NOTE — Progress Notes (Signed)
Pre visit review using our clinic review tool, if applicable. No additional management support is needed unless otherwise documented below in the visit note. 

## 2016-09-27 NOTE — Progress Notes (Signed)
Phone: [phone]  Subjective:  Patient presents today to establish care with me as their new primary care provider. Patient was formerly a patient of Dr. Leanne Chang then Dr. Shawna Orleans. Chief complaint-noted.   See problem oriented charting ROS- No chest pain or shortness of breath. No headache or blurry vision. No dizziness.   The following were reviewed and entered/updated in epic: Past Medical History:  Diagnosis Date  . 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 Arthritis   . CAD (coronary artery disease)    seen by Dr. Johnsie Cancel  . Foot swelling   . Gout   . History of kidney stones   . Hyperlipidemia   . Hypertension   . Myocardial infarction 1997  . Nocturia   . Obesity   . Patellar tendonitis   . Personal history of colonic polyp-adenoma  11/20/2013  . Plantar fasciitis    Patient Active Problem List   Diagnosis Date Noted  . CAD (coronary artery disease) 02/21/2007    Priority: High  . Nonspecific elevation of levels of transaminase or lactic acid dehydrogenase (LDH) 12/19/2012    Priority: Medium  . Gout 09/01/2009    Priority: Medium  . Hyperlipidemia 02/21/2007    Priority: Medium  . Essential hypertension 02/21/2007    Priority: Medium  . Plantar fasciitis     Priority: Low  . Left hip pain 11/20/2014    Priority: Low  . Dermatitis 11/20/2014    Priority: Low  . Personal history of colonic polyp-adenoma  11/20/2013    Priority: Low  . Obese 06/13/2013    Priority: Low  . S/P right THA, AA 06/12/2013    Priority: Low  . Left foot pain 12/19/2012    Priority: Low  . Actinic keratosis 08/03/2012    Priority: Low  . Kidney stone 02/21/2007    Priority: Low   Past Surgical History:  Procedure Laterality Date  . CORONARY ARTERY BYPASS GRAFT  1997   3 VESSELS  . HEMICOLECTOMY Right 2007   low grade mucnous appendiceal neoplasm  . HERNIA REPAIR    . TONSILLECTOMY    . TOTAL HIP ARTHROPLASTY Right 06/12/2013   Procedure: RIGHT TOTAL HIP  ARTHROPLASTY ANTERIOR APPROACH;  Surgeon: Mauri Pole, MD;  Location: [facility];  Service: Orthopedics;  Laterality: Right;    Family History  Problem Relation Age of Onset  . Stroke Mother   . Heart disease Mother   . Aneurysm Father   . Colon cancer Neg Hx   . Esophageal cancer Neg Hx   . Rectal cancer Neg Hx   . Stomach cancer Neg Hx     Medications- reviewed and updated Current Outpatient Prescriptions  Medication Sig Dispense Refill  . amoxicillin (AMOXIL) 500 MG capsule Take 500 mg by mouth 3 (three) times daily. Pt takes for dental appts    . carvedilol (COREG) 3.125 MG tablet Take 1 tablet (3.125 mg total) by mouth 2 (two) times daily with a meal. 180 tablet 3  . indomethacin (INDOCIN) 25 MG capsule Take 25 mg by mouth 3 (three) times daily as needed for mild pain.    Marland Kitchen losartan-hydrochlorothiazide (HYZAAR) 100-25 MG tablet Take 1 tablet by mouth daily. 90 tablet 3  . nitroGLYCERIN (NITROSTAT) 0.4 MG SL tablet Place 1 tablet (0.4 mg total) under the tongue every 5 (five) minutes as needed. 25 tablet 3  . rosuvastatin (CRESTOR) 40 MG tablet take 1 tablet by mouth once daily 90 tablet 1   No current  facility-administered medications for this visit.     Allergies-reviewed and updated Allergies  Allergen Reactions  . Gadolinium Derivatives Shortness Of Breath, Itching, Swelling and Cough    Symptoms include: SOB, wheezing, throat numbness, cough, congestion of nasal cavities, itching of hand and foot, and swelling. Contrast agent given- MultiHance.    Social History   Social History  . Marital status: Married    Spouse name: N/A  . Number of children: N/A  . Years of education: N/A   Occupational History  . 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 Smoking status: Never Smoker  . Smokeless tobacco: Never Used  . Alcohol use 8.4 - 12.6 oz/week    14 - 21 Glasses of wine per week     Comment: 2-3 gl wine daily  . Drug use:  No  . Sexual activity: Not Asked   Other Topics Concern  . None   Social History Narrative   Married. No kids. 1 stapdaughter 31- no grandkids. 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 Some physics/chemistry as well.       Hobbies: enjoys walking (plantar fasciitis inhibits and also patellar tendonitis)    ROS--See HPI   Objective: BP 138/88   Pulse 86   Temp 98.2 F (36.8 C) (Oral)   Ht 6' 1.25" (1.861 m)   Wt 282 lb 9.6 oz (128.2 kg)   SpO2 97%   BMI 37.03 kg/m  Gen: NAD, resting comfortably HEENT: Mucous membranes are moist. Oropharynx normal Neck: no thyromegaly CV: RRR no murmurs rubs or gallops Lungs: CTAB no crackles, wheeze, rhonchi Abdomen: soft/nontender/nondistended/normal bowel sounds. No rebound or guarding.  Epigastric hernia noted. Diastasis recti.  Ext: trace edema Skin: warm, dry Neuro: grossly normal, moves all extremities, PERRLA  Assessment/Plan:  Essential hypertension S: controlled on repeat on losartan hctz 100-25mg , coreg 3.125mg  BID (took first dose this AM).  BP Readings from Last 3 Encounters:  09/27/16 138/88  09/02/16 (!) 170/100  05/22/15 138/76  A/P:Continue current meds:  Doing much better than last measure and follows up with cardiology as well for this  Gout S:Usually less than once a year. hctz risk but started by cardiology due to CAD being greater risk to long term health.  Did not want to take allopurinol. Often right ankle bothered A/P: may need to readdress need for allopurinol if future if starts to flare again on hctz.    Hyperlipidemia S: reasonaly controlled on crestor 40mg  though would prefer LDL under 70. No myalgias.  Lab Results  Component Value Date   CHOL 181 08/25/2016   HDL 49 08/25/2016   LDLCALC 92 08/25/2016   LDLDIRECT 84.2 08/22/2013   TRIG 202 (H) 08/25/2016   CHOLHDL 3.7 08/25/2016   A/P: continue crestor  40mg , we discussed working on weight loss. If does not make progress consider zetia?   Nonspecific elevation of levels of transaminase or lactic acid dehydrogenase (LDH) S: often 3 glasses of wine a note. Per prior note- mention of fatty liver on CT Lab Results  Component Value Date   ALT 48 (H) 08/25/2016   AST 46 (H) 08/25/2016   ALKPHOS 77 08/25/2016   BILITOT 0.7 08/25/2016  A/P: advised 0 alcohol idea, 1 if absolutely must. Needs weight loss. Continue to trend.    Plantar fasciitis States barrier for him improving is often various  injuries. More recently plantar fasciitis. In past had patellar tendonitis- that being said was walkign 4-4.5 miles a day- we discussed starting activities slowly. He has indomethacin on hand- prior osgood schlatter and higher risk of tendonitis in knee- often 2 in a day will knock this down and also can use for gout.   Some cardiac and kidney risk with indomethacin so sparing use more ideal  Return in about 6 months (around 03/27/2017) for physical. come in fasting that day .Marland Kitchen  Orders Placed This Encounter  Procedures  . Pneumococcal polysaccharide vaccine 23-valent greater than or equal to 2yo subcutaneous/IM   Return precautions advised.   Garret Reddish, MD

## 2016-09-27 NOTE — Assessment & Plan Note (Addendum)
States barrier for him improving is often various injuries. More recently plantar fasciitis. In past had patellar tendonitis- that being said was walkign 4-4.5 miles a day- we discussed starting activities slowly. He has indomethacin on hand- prior osgood schlatter and higher risk of tendonitis in knee- often 2 in a day will knock this down and also can use for gout.   Some cardiac and kidney risk with indomethacin so sparing use more ideal

## 2016-09-27 NOTE — Patient Instructions (Addendum)
Pneumovax 23 today.   Agree with your goal of 235  Think cutting wine down to 0-1 per day to help with weight and mild liver elevations

## 2016-09-30 NOTE — Progress Notes (Signed)
Chart updated

## 2016-09-30 NOTE — Addendum Note (Signed)
Addended by: Lucianne Lei M on: 09/30/2016 11:34 AM   Modules accepted: Orders

## 2016-10-05 ENCOUNTER — Telehealth: Payer: Self-pay | Admitting: Cardiovascular Disease

## 2016-10-05 ENCOUNTER — Ambulatory Visit (INDEPENDENT_AMBULATORY_CARE_PROVIDER_SITE_OTHER): Payer: Medicare HMO | Admitting: Family Medicine

## 2016-10-05 ENCOUNTER — Encounter: Payer: Self-pay | Admitting: Family Medicine

## 2016-10-05 DIAGNOSIS — M1A00X0 Idiopathic chronic gout, unspecified site, without tophus (tophi): Secondary | ICD-10-CM | POA: Diagnosis not present

## 2016-10-05 DIAGNOSIS — I10 Essential (primary) hypertension: Secondary | ICD-10-CM

## 2016-10-05 NOTE — Assessment & Plan Note (Signed)
S: right knee pain starting on Saturday. This was after walking a fair amount of Friday. Then pain moved up into the patella. Stated indomethacin 25 mg twice a day on Sunday. Has noted significant swelling up above the knee. Now has some pain in the area of swelling.   Today states no pain with walking- does have pain with flexion of the lower leg. This has been improving steadily with the indomethacin.   Had swollen ankle and wondered if it was gout but went away quickly sometime this year. This is first likely more clear gout flare since starting HCTZ.  A/P: Patient is aware of risks for gout with hctz but needs support with BP. Amlodipine likely would cause worsening edema and he has some at baseline.   I suspect this is a gout flare given size of effusion and good response of pain to indomethacin. Discussed only way to know would be arthrocentesis- he would be willing to consider SM referral next week if not improved. He could not get into his orthopedist Dr. Alvan Dame for over a week.

## 2016-10-05 NOTE — Progress Notes (Addendum)
Subjective:  Shawn Anderson is a 66 y.o. year old very pleasant male patient who presents for/with See problem oriented charting ROS- no fever, chills. Does have left knee pain. No expanding redness. Does note swelling above the knee   Past Medical History-  Patient Active Problem List   Diagnosis Date Noted  . CAD (coronary artery disease) 02/21/2007    Priority: High  . Nonspecific elevation of levels of transaminase or lactic acid dehydrogenase (LDH) 12/19/2012    Priority: Medium  . Gout 09/01/2009    Priority: Medium  . Hyperlipidemia 02/21/2007    Priority: Medium  . Essential hypertension 02/21/2007    Priority: Medium  . Plantar fasciitis     Priority: Low  . Left hip pain 11/20/2014    Priority: Low  . Dermatitis 11/20/2014    Priority: Low  . Personal history of colonic polyp-adenoma  11/20/2013    Priority: Low  . Obese 06/13/2013    Priority: Low  . S/P right THA, AA 06/12/2013    Priority: Low  . Left foot pain 12/19/2012    Priority: Low  . Actinic keratosis 08/03/2012    Priority: Low  . Kidney stone 02/21/2007    Priority: Low    Medications- reviewed and updated Current Outpatient Prescriptions  Medication Sig Dispense Refill  . amoxicillin (AMOXIL) 500 MG capsule Take 500 mg by mouth 3 (three) times daily. Pt takes for dental appts    . carvedilol (COREG) 3.125 MG tablet Take 1 tablet (3.125 mg total) by mouth 2 (two) times daily with a meal. 180 tablet 3  . indomethacin (INDOCIN) 25 MG capsule Take 25 mg by mouth 3 (three) times daily as needed for mild pain.    Marland Kitchen losartan-hydrochlorothiazide (HYZAAR) 100-25 MG tablet Take 1 tablet by mouth daily. 90 tablet 3  . nitroGLYCERIN (NITROSTAT) 0.4 MG SL tablet Place 1 tablet (0.4 mg total) under the tongue every 5 (five) minutes as needed. 25 tablet 3  . rosuvastatin (CRESTOR) 40 MG tablet take 1 tablet by mouth once daily 90 tablet 1   No current facility-administered medications for this visit.      Objective: BP (!) 144/88   Pulse 89   Temp 98.4 F (36.9 C) (Oral)   Ht 6' 1.25" (1.861 m)   Wt 284 lb (128.8 kg)   SpO2 98%   BMI 37.21 kg/m  Gen: NAD, resting comfortably CV: RRR no murmurs rubs or gallops Lungs: CTAB no crackles, wheeze, rhonchi Ext: trace edema Skin: warm, dry, no rash  Left Knee: abNormal to inspection with significant effusion noted even without palpation. There is slight erythema and there is definite warmth.  Palpation abnormal with bilateral joint line tenderness. No  patellar tenderness or condyle tenderness. ROM normalinextension and lower leg rotation. Flexion produces pain of lower leg Ligaments with solid consistent endpoints including ACL, PCL, LCL, MCL. Patellar and quadriceps tendons unremarkable. Hamstring and quadriceps strength is normal.   Assessment/Plan:  Gout S: right knee pain starting on Saturday. This was after walking a fair amount of Friday. Then pain moved up into the patella. Stated indomethacin 25 mg twice a day on Sunday. Has noted significant swelling up above the knee. Now has some pain in the area of swelling.   Today states no pain with walking- does have pain with flexion of the lower leg. This has been improving steadily with the indomethacin.   Had swollen ankle and wondered if it was gout but went away quickly sometime this  year. This is first likely more clear gout flare since starting HCTZ.  A/P: Patient is aware of risks for gout with hctz but needs support with BP. Amlodipine likely would cause worsening edema and he has some at baseline.   I suspect this is a gout flare given size of effusion and good response of pain to indomethacin. Discussed only way to know would be arthrocentesis- he would be willing to consider SM referral next week if not improved. He could not get into his orthopedist Dr. Alvan Dame for over a week.   No fall or injury to suggest acute injury causing this level of effusion  Essential  hypertension S: controlled poorly on on losartan -hctz 100-25.  No coreg today- has been taking once a day only as worried about side effects. Home #s - mainly 130s over high 80s sometimes 90s.Initial BP was 170 but came down with rest BP Readings from Last 3 Encounters:  10/05/16 (!) 144/88  09/27/16 138/88  09/02/16 (!) 170/100  A/P:Continue current meds:  Advised him to take the coreg BID- he agrees to trial. BP also likely up due to indomethacin. Was controlled though very high normal last visit. Could consider amlodipine but does have baseline edema.   Return precautions advised.  Garret Reddish, MD

## 2016-10-05 NOTE — Telephone Encounter (Signed)
Patient complaining about right ankle swelling for the past 2 days, that goes down at night. Patient stated also now he has swelling right above his right knee. Patient stated it's tender and warm to touch, but denies any pain or redness. Patient stated he thinks it's his Hyzaar. Patient stated he does not think it's his gout. Patient stated his BP is better at 134/88. Patient stated that he had some swelling about a year ago when he tried taking a low dose HCTZ 12.5 mg. Will forward to Dr. Johnsie Cancel for advisement to see if patient needs a doppler of his RLE and /or if he wants to make changes to patient's medications.

## 2016-10-05 NOTE — Telephone Encounter (Signed)
Patient is calling about his Losartan-hydrochlorothiazide states that he believes it may be causing swelling in his joints. Please call to discuss, thanks.

## 2016-10-05 NOTE — Patient Instructions (Signed)
Would take the coreg twice a day  Would do the indomethacin at least twice a day until resolved or 10 days.   If no significant improvement by Monday- call us and we can try to get you in with Dr. Paulla Fore of sports medicine  Or you can call Dr. Alvan Dame

## 2016-10-05 NOTE — Assessment & Plan Note (Addendum)
S: controlled poorly on on losartan -hctz 100-25.  No coreg today- has been taking once a day only as worried about side effects. Home #s - mainly 130s over high 80s sometimes 90s.Initial BP was 170 but came down with rest BP Readings from Last 3 Encounters:  10/05/16 (!) 144/88  09/27/16 138/88  09/02/16 (!) 170/100  A/P:Continue current meds:  Advised him to take the coreg BID- he agrees to trial. BP also likely up due to indomethacin. Was controlled though very high normal last visit.  Could consider amlodipine but does have baseline edema

## 2016-10-05 NOTE — Progress Notes (Signed)
Pre visit review using our clinic review tool, if applicable. No additional management support is needed unless otherwise documented below in the visit note. 

## 2016-10-06 NOTE — Telephone Encounter (Signed)
Called patient back. Patient is following Dr. Ansel Bong recommendations and will follow up with Nell Range PA later this month.

## 2016-10-06 NOTE — Telephone Encounter (Signed)
Has seen primary stop hyzaar and take cozaar with no diuretic so gout does not flare Call in aldactone 12.5 mg PRN for edema. BMET in 4 weeks. If swelling continues Can do LE venous duplex

## 2016-10-06 NOTE — Telephone Encounter (Signed)
Pt states he has appt with cardiology on March 29. Pt states he prefers to see them first, then follow up with Dr Yong Channel if needs. Pt states his knee swelling is slightly down. He will keep a check on his bp. Advised and offered pt several times to make the follow up appt in 2-3 weeks, and each time pt declined.  Pt states can check mychart after his visit with cardiology.

## 2016-10-06 NOTE — Telephone Encounter (Signed)
Follow Up:    Please call,pt says he wants to give you an update.

## 2016-10-06 NOTE — Telephone Encounter (Signed)
I am very confused by this. I saw patient yesterday. We discussed it seemed he needed the hctz to control his BP. He has baseline swelling and was not thrilled about alternate of amlodipine (though this would probably be next step).   He was opposed to allopurinol to lower uric acid. He did not want a referral for arthrocentesis to determine if this was gout - but I told him I thought this likely was gout- all of his swelling is an effusion of knee joint.   Roselyn Reef- I would advise we see him back because strongly suspect BP will be elevated off of medicine- can you schedule him for perhaps 2-3 weeks?

## 2016-10-06 NOTE — Telephone Encounter (Signed)
Called and left a voicemail message asking for a return phone call so I can schedule the follow up in 2-3 weeks

## 2016-10-06 NOTE — Telephone Encounter (Signed)
Also confused not sure why he contacted [place] worried about his heart seemed like something you were dealing With appropriately

## 2016-10-19 ENCOUNTER — Encounter: Payer: Self-pay | Admitting: Physician Assistant

## 2016-10-20 ENCOUNTER — Telehealth: Payer: Self-pay | Admitting: Family Medicine

## 2016-10-20 NOTE — Telephone Encounter (Signed)
° ° ° °  Pt call to ask if Dr Yong Channel would RX the below med  indomethacin (INDOCIN) 25 MG capsule   Pt said it was 25 mg capsules by mouth every 12 hours after meals Said he does not take it that much   Shawn Anderson

## 2016-10-21 ENCOUNTER — Ambulatory Visit: Payer: Medicare HMO | Admitting: Physician Assistant

## 2016-10-21 MED ORDER — INDOMETHACIN 25 MG PO CAPS: 25.0000 mg | ORAL_CAPSULE | Freq: Two times a day (BID) | ORAL | 2 refills | Status: DC

## 2016-10-21 NOTE — Telephone Encounter (Signed)
Sent in. May inform patient

## 2016-10-22 NOTE — Telephone Encounter (Signed)
Spoke with patient this morning who verbalized understanding

## 2016-10-22 NOTE — Telephone Encounter (Signed)
Called and left a voicemail message.

## 2016-10-22 NOTE — Progress Notes (Signed)
Cardiology Office Note    Date:  10/28/2016   ID:  ELIZER BOSTIC, DOB 1950/08/07, MRN [id number]  PCP:  Garret Reddish, MD  Cardiologist: Dr. Johnsie Cancel  CC: follow up   History of Present Illness:  Shawn Anderson is a 66 y.o. male with a history of HTN, HLD, CAD s/p CABG x3V (1997), gout and obesity who presents to clinic for follow up.   Last myovue showed EF 37% with inferior scar no ischemia in 04/29/15. He had a cardiac MRI 05/22/15 which showed EF 42% inferior thinning and hypokinesis. Of note had reaction to gadolinium with hives and pruritis so no delayed gad images were completed.   He was last seen by Dr. Johnsie Cancel in the office on 09/02/16 for follow up. He admitted to drinking a lot, putting on weight and not exercising.  BP was quite elevated and Coreg 3.125mg  BID and Hyzaar 100/25 added.  His activity is limited mostly by hip pain and plantar facitis. He was encouraged to start swimming.   He has some swelling in his knees and there as some concern for gout and diuretic making this worse. He was told to stop Hyzaar and start Cozaar. However, he never did stop taking this and knee effusions resolved. He has not had any issues since. He may have to start taking allopurinol but wanting to avoid adding any further medications.   Today he presents to clinic for follow up. No CP or SOB. No LE edema, orthopnea or PND. No dizziness or syncope. No blood in stool or urine. No palpitations.     Past Medical History:  Diagnosis Date  . 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 Arthritis   . CAD (coronary artery disease)    seen by Dr. Johnsie Cancel  . Foot swelling   . Gout   . History of kidney stones   . Hyperlipidemia   . Hypertension   . Myocardial infarction 1997  . Nocturia   . Obesity   . Patellar tendonitis   . Personal history of colonic polyp-adenoma  11/20/2013  . Plantar fasciitis     Past Surgical History:  Procedure Laterality Date  . CORONARY ARTERY  BYPASS GRAFT  1997   3 VESSELS  . HEMICOLECTOMY Right 2007   low grade mucnous appendiceal neoplasm  . HERNIA REPAIR    . TONSILLECTOMY    . TOTAL HIP ARTHROPLASTY Right 06/12/2013   Procedure: RIGHT TOTAL HIP ARTHROPLASTY ANTERIOR APPROACH;  Surgeon: Mauri Pole, MD;  Location: [facility];  Service: Orthopedics;  Laterality: Right;    Current Medications: Outpatient Medications Prior to Visit  Medication Sig Dispense Refill  . amoxicillin (AMOXIL) 500 MG capsule Take 500 mg by mouth 3 (three) times daily. Pt takes for dental appts    . carvedilol (COREG) 3.125 MG tablet Take 1 tablet (3.125 mg total) by mouth 2 (two) times daily with a meal. 180 tablet 3  . losartan-hydrochlorothiazide (HYZAAR) 100-25 MG tablet Take 1 tablet by mouth daily. 90 tablet 3  . nitroGLYCERIN (NITROSTAT) 0.4 MG SL tablet Place 1 tablet (0.4 mg total) under the tongue every 5 (five) minutes as needed. 25 tablet 3  . rosuvastatin (CRESTOR) 40 MG tablet take 1 tablet by mouth once daily 90 tablet 1  . indomethacin (INDOCIN) 25 MG capsule Take 1 capsule (25 mg total) by mouth 2 (two) times daily with a meal. 30 capsule 2   No facility-administered medications prior to visit.  Allergies:   Gadolinium derivatives   Social History   Social History  . Marital status: Married    Spouse name: N/A  . Number of children: N/A  . Years of education: N/A   Occupational History  . 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 Smoking status: Never Smoker  . Smokeless tobacco: Never Used  . Alcohol use 8.4 - 12.6 oz/week    14 - 21 Glasses of wine per week     Comment: 2-3 gl wine daily  . Drug use: No  . Sexual activity: Not Asked   Other Topics Concern  . None   Social History Narrative   Married. No kids. 1 stapdaughter 31- no grandkids. 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       Hobbies: enjoys walking (plantar fasciitis inhibits and also patellar tendonitis)     Family History:  The patient's family history includes Aneurysm in his father; Heart disease in his mother; Stroke in his mother.      ROS:   Please see the history of present illness.    ROS All other systems reviewed and are negative.   PHYSICAL EXAM:   VS:  BP 140/80   Pulse 76   Ht 6\' 1"  (1.854 m)   Wt 281 lb 8 oz (127.7 kg)   SpO2 97%   BMI 37.14 kg/m    GEN: Well nourished, well developed, in no acute distress, obese HEENT: normal  Neck: no JVD, carotid bruits, or masses Cardiac: RRR; no murmurs, rubs, or gallops,no edema  Respiratory:  clear to auscultation bilaterally, normal work of breathing GI: soft, nontender, nondistended, + BS MS: no deformity or atrophy  Skin: warm and dry, no rash Neuro:  Alert and Oriented x 3, Strength and sensation are intact Psych: euthymic mood, full affect   Wt Readings from Last 3 Encounters:  10/28/16 281 lb 8 oz (127.7 kg)  10/05/16 284 lb (128.8 kg)  09/27/16 282 lb 9.6 oz (128.2 kg)      Studies/Labs Reviewed:   EKG:  EKG is NOT ordered today.    Recent Labs: 08/25/2016: ALT 48; BUN 17; Creatinine, Ser 0.87; Potassium 4.7; Sodium 137   Lipid Panel    Component Value Date/Time   CHOL 181 08/25/2016 0848   TRIG 202 (H) 08/25/2016 0848   TRIG 269 (HH) 08/01/2006 1039   HDL 49 08/25/2016 0848   CHOLHDL 3.7 08/25/2016 0848   CHOLHDL 3 11/13/2014 0831   VLDL 24.8 11/13/2014 0831   LDLCALC 92 08/25/2016 0848   LDLDIRECT 84.2 08/22/2013 1549    Additional studies/ records that were reviewed today include:  Myoview 04/2015 Study Highlights    The left ventricular ejection fraction is moderately decreased (30-44%).  Nuclear stress EF: 37%.  There was no ST segment deviation noted during stress.  Defect 1: There is a large defect of severe severity present in the basal inferior, basal  inferolateral, mid inferior, mid inferolateral, apical inferior and apical lateral location.  Findings consistent with prior myocardial infarction.  This is an intermediate risk study.      Cardiac MR 05/2015 IMPRESSION: 1) Moderate LV dilatation with severe inferior wall hypokinesis consistent with previous MI. EF 42% 2) Biatrial enlargement 3) Normal RV 4) No significant ischemic MR 5) Significant Gadolinium reaction. Patient should not  receive again see Rx noted above   ASSESSMENT & PLAN:   CAD s/p CABG x3V (1997): stable. Not taking a baby ASA everyday. Discussed the importance of this. Will have him start ASA 81mg  daily. Continue Coreg and statin.   Morbid Obesity: Body mass index is 37.14 kg/m. Discussed importance of diet and exercise  HLD: continue statin.   HTN: BP borderline today but patient has white coat HTN. He says it runs better at home. BP is much improved on Hyzaar and Coreg.  Gout: takes indomethacin as needed. Has not have any issues recently so he would like to continue on Hyzaar for now. If he does have more issues will stop Hyzaar and start Cozaar.   Medication Adjustments/Labs and Tests Ordered: Current medicines are reviewed at length with the patient today.  Concerns regarding medicines are outlined above.  Medication changes, Labs and Tests ordered today are listed in the Patient Instructions below. Patient Instructions  Medication Instructions:  Your physician has recommended you make the following change in your medication:  1.  START Aspirin 81 mg taking 1 daily   Labwork: TODAY:  BMET  Testing/Procedures: None ordered  Follow-Up: Your physician wants you to follow-up in: [place] DR. Johnsie Cancel You will receive a reminder letter in the mail two months in advance. If you don't receive a letter, please call our office to schedule the follow-up appointment.   Any Other Special Instructions Will Be Listed Below (If  Applicable).     If you need a refill on your cardiac medications before your next appointment, please call your pharmacy.      Signed, Angelena Form, PA-C  10/28/2016 10:35 AM    [facility] [place], [place], [place]  [address] Phone: [phone]; Fax: [phone]

## 2016-10-28 ENCOUNTER — Encounter: Payer: Self-pay | Admitting: Physician Assistant

## 2016-10-28 ENCOUNTER — Ambulatory Visit (INDEPENDENT_AMBULATORY_CARE_PROVIDER_SITE_OTHER): Payer: Medicare HMO | Admitting: Physician Assistant

## 2016-10-28 VITALS — BP 140/80 | HR 76 | Ht 73.0 in | Wt 281.5 lb

## 2016-10-28 DIAGNOSIS — M109 Gout, unspecified: Secondary | ICD-10-CM | POA: Diagnosis not present

## 2016-10-28 DIAGNOSIS — E785 Hyperlipidemia, unspecified: Secondary | ICD-10-CM

## 2016-10-28 DIAGNOSIS — I10 Essential (primary) hypertension: Secondary | ICD-10-CM

## 2016-10-28 DIAGNOSIS — I2510 Atherosclerotic heart disease of native coronary artery without angina pectoris: Secondary | ICD-10-CM

## 2016-10-28 MED ORDER — ASPIRIN EC 81 MG PO TBEC: 81.0000 mg | DELAYED_RELEASE_TABLET | Freq: Every day | ORAL | 3 refills | Status: DC

## 2016-10-28 NOTE — Patient Instructions (Signed)
Medication Instructions:  Your physician has recommended you make the following change in your medication:  1.  START Aspirin 81 mg taking 1 daily   Labwork: TODAY:  BMET  Testing/Procedures: None ordered  Follow-Up: Your physician wants you to follow-up in: [place] DR. Johnsie Cancel You will receive a reminder letter in the mail two months in advance. If you don't receive a letter, please call our office to schedule the follow-up appointment.   Any Other Special Instructions Will Be Listed Below (If Applicable).     If you need a refill on your cardiac medications before your next appointment, please call your pharmacy.

## 2016-10-29 ENCOUNTER — Telehealth: Payer: Self-pay | Admitting: Physician Assistant

## 2016-10-29 LAB — BASIC METABOLIC PANEL
BUN/Creatinine Ratio: 18 (ref 10–24)
BUN: 14 mg/dL (ref 8–27)
CALCIUM: 9.5 mg/dL (ref 8.6–10.2)
CHLORIDE: 95 mmol/L — AB (ref 96–106)
CO2: 25 mmol/L (ref 18–29)
Creatinine, Ser: 0.76 mg/dL (ref 0.76–1.27)
GFR calc non Af Amer: 96 mL/min/{1.73_m2} (ref >59)
GFR, EST AFRICAN AMERICAN: 111 mL/min/{1.73_m2} (ref >59)
Glucose: 103 mg/dL — ABNORMAL HIGH (ref 65–99)
POTASSIUM: 4.2 mmol/L (ref 3.5–5.2)
SODIUM: 138 mmol/L (ref 134–144)

## 2016-10-29 NOTE — Telephone Encounter (Signed)
New Message ° ° pt verbalized that he is returning call for rn  °

## 2016-10-29 NOTE — Telephone Encounter (Signed)
Returned pts call and discussed his lab results. Pt verbalized understanding.

## 2016-10-29 NOTE — Telephone Encounter (Signed)
-----   Message from Eileen Stanford, PA-C sent at 10/29/2016  9:45 AM EDT ----- Labs look good

## 2016-11-10 ENCOUNTER — Other Ambulatory Visit: Payer: Self-pay | Admitting: Family Medicine

## 2016-12-21 DIAGNOSIS — R69 Illness, unspecified: Secondary | ICD-10-CM | POA: Diagnosis not present

## 2017-08-10 ENCOUNTER — Other Ambulatory Visit: Payer: Self-pay | Admitting: Family Medicine

## 2017-08-16 DIAGNOSIS — R69 Illness, unspecified: Secondary | ICD-10-CM | POA: Diagnosis not present

## 2017-08-25 ENCOUNTER — Other Ambulatory Visit: Payer: Self-pay | Admitting: Cardiovascular Disease

## 2017-09-08 ENCOUNTER — Other Ambulatory Visit: Payer: Self-pay | Admitting: Cardiovascular Disease

## 2017-09-08 MED ORDER — LOSARTAN POTASSIUM-HCTZ 100-25 MG PO TABS: 1.0000 | ORAL_TABLET | Freq: Every day | ORAL | 1 refills | Status: DC

## 2017-09-08 NOTE — Telephone Encounter (Signed)
Pt's medication was sent to pt's pharmacy as requested. Confirmation received.  °

## 2017-09-08 NOTE — Telephone Encounter (Signed)
New message    *STAT* If patient is at the pharmacy, call can be transferred to refill team.   1. Which medications need to be refilled? (please list name of each medication and dose if known)   losartan-hydrochlorothiazide (HYZAAR) 100-25 MG tablet   2. Which pharmacy/location (including street and city if local pharmacy) is medication to be sent to?  [facility] [address] [place] [phone] (Phone) [phone] (Fax)   3. Do they need a 30 day or 90 day supply? [place]

## 2017-10-17 ENCOUNTER — Ambulatory Visit: Payer: Medicare HMO | Admitting: Cardiovascular Disease

## 2017-10-27 NOTE — Progress Notes (Signed)
Cardiology Office Note    Date:  11/02/2017   ID:  Shawn Anderson, DOB 1951-05-06, MRN [id number]  PCP:  Marin Olp, MD  Cardiologist: Dr. Johnsie Cancel  CC: follow up   History of Present Illness:   67 y.o. CAD/CABG 1997 HTN and Gout. Last myovue 04/29/15 inferior scar no ischemia. Cardiac MRI 05/22/15 EF 42% so no AICD. Had reaction to gadolinium with hives and pruritis so should not have again. HTN Rx with coreg and ARB. Needs to be reminded to take ASA daily. Probable gouty knee arthritis better with allopurinol  Walking trails by [employer] a lot. Going back to [place] this summer No angina needs new nitro    Past Medical History:  Diagnosis Date  . 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 Arthritis   . CAD (coronary artery disease)    seen by Dr. Johnsie Cancel  . Foot swelling   . Gout   . History of kidney stones   . Hyperlipidemia   . Hypertension   . Myocardial infarction ([place]) 1997  . Nocturia   . Obesity   . Patellar tendonitis   . Personal history of colonic polyp-adenoma  11/20/2013  . Plantar fasciitis     Past Surgical History:  Procedure Laterality Date  . CORONARY ARTERY BYPASS GRAFT  1997   3 VESSELS  . HEMICOLECTOMY Right 2007   low grade mucnous appendiceal neoplasm  . HERNIA REPAIR    . TONSILLECTOMY    . TOTAL HIP ARTHROPLASTY Right 06/12/2013   Procedure: RIGHT TOTAL HIP ARTHROPLASTY ANTERIOR APPROACH;  Surgeon: Mauri Pole, MD;  Location: [facility];  Service: Orthopedics;  Laterality: Right;    Current Medications: Outpatient Medications Prior to Visit  Medication Sig Dispense Refill  . amoxicillin (AMOXIL) 500 MG capsule Take 500 mg by mouth 3 (three) times daily. Pt takes for dental appts    . aspirin EC 81 MG tablet Take 1 tablet (81 mg total) by mouth daily. 90 tablet 3  . carvedilol (COREG) 3.125 MG tablet Take 1 tablet (3.125 mg total) by mouth 2 (two) times daily with a meal. 180 tablet 3  . losartan-hydrochlorothiazide  (HYZAAR) 100-25 MG tablet Take 1 tablet by mouth daily. Please keep upcoming appt in March for future refills. Thank you 30 tablet 1  . nitroGLYCERIN (NITROSTAT) 0.4 MG SL tablet Place 1 tablet (0.4 mg total) under the tongue every 5 (five) minutes as needed. 25 tablet 3  . rosuvastatin (CRESTOR) 40 MG tablet TAKE ONE TABLET BY MOUTH DAILY 90 tablet 0   No facility-administered medications prior to visit.      Allergies:   Gadolinium derivatives   Social History   Socioeconomic History  . Marital status: Married    Spouse name: Not on file  . Number of children: Not on file  . Years of education: Not on file  . Highest education level: Not on file  Occupational History  . Occupation: Product manager: [employer]    Comment: Patent attorney  Social Needs  . Financial resource strain: Not on file  . Food insecurity:    Worry: Not on file    Inability: Not on file  . Transportation needs:    Medical: Not on file    Non-medical: Not on file  Tobacco Use  . Smoking status: Never Smoker  . Smokeless tobacco: Never Used  Substance and Sexual Activity  . Alcohol use: Yes    Alcohol/week:  8.4 - 12.6 oz    Types: 14 - 21 Glasses of wine per week    Comment: 2-3 gl wine daily  . Drug use: No  . Sexual activity: Not on file  Lifestyle  . Physical activity:    Days per week: Not on file    Minutes per session: Not on file  . Stress: Not on file  Relationships  . Social connections:    Talks on phone: Not on file    Gets together: Not on file    Attends religious service: Not on file    Active member of club or organization: Not on file    Attends meetings of clubs or organizations: Not on file    Relationship status: Not on file  Other Topics Concern  . Not on file  Social History Narrative   Married. No kids. 1 stapdaughter 31- no grandkids. 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       Hobbies: enjoys walking (plantar fasciitis inhibits and also patellar tendonitis)     Family History:  The patient's family history includes Aneurysm in his father; Heart disease in his mother; Stroke in his mother.      ROS:   Please see the history of present illness.    ROS All other systems reviewed and are negative.   PHYSICAL EXAM:   VS:  BP (!) 142/92   Pulse 78   Ht 6\' 1"  (1.854 m)   Wt 283 lb (128.4 kg)   SpO2 97%   BMI 37.34 kg/m    Affect appropriate Healthy:  appears stated age 11: normal Neck supple with no adenopathy JVP normal no bruits no thyromegaly Lungs clear with no wheezing and good diaphragmatic motion Heart:  S1/S2 no murmur, no rub, gallop or click PMI normal post sternotomy  Abdomen: benighn, BS positve, no tenderness, no AAA no bruit.  No HSM or HJR Distal pulses intact with no bruits No edema Neuro non-focal Skin warm and dry No muscular weakness    Wt Readings from Last 3 Encounters:  11/02/17 283 lb (128.4 kg)  10/28/16 281 lb 8 oz (127.7 kg)  10/05/16 284 lb (128.8 kg)      Studies/Labs Reviewed:   EKG:  SR rate 55 old IMI no acute changes    Recent Labs: No results found for requested labs within last 8760 hours.   Lipid Panel    Component Value Date/Time   CHOL 181 08/25/2016 0848   TRIG 202 (H) 08/25/2016 0848   TRIG 269 (HH) 08/01/2006 1039   HDL 49 08/25/2016 0848   CHOLHDL 3.7 08/25/2016 0848   CHOLHDL 3 11/13/2014 0831   VLDL 24.8 11/13/2014 0831   LDLCALC 92 08/25/2016 0848   LDLDIRECT 84.2 08/22/2013 1549    Additional studies/ records that were reviewed today include:  Myoview 04/2015 Study Highlights    The left ventricular ejection fraction is moderately decreased (30-44%).  Nuclear stress EF: 37%.  There was no ST segment deviation noted during stress.  Defect 1: There is a large defect of severe severity present in the basal inferior,  basal inferolateral, mid inferior, mid inferolateral, apical inferior and apical lateral location.  Findings consistent with prior myocardial infarction.  This is an intermediate risk study.      Cardiac MR 05/2015 IMPRESSION: 1) Moderate LV dilatation with severe inferior wall hypokinesis consistent  with previous MI. EF 42% 2) Biatrial enlargement 3) Normal RV 4) No significant ischemic MR 5) Significant Gadolinium reaction. Patient should not receive again see Rx noted above   ASSESSMENT & PLAN:   CAD s/p CABG x3V (1997): stable.non ischemic myovue 2016 continue medical Rx   Morbid Obesity: Discussed low carb diet and exercise   HLD: continue statin.   HTN: Well controlled.  Continue current medications and low sodium Dash type diet.  Some white coat component   Gout: continue indocin and allopurinol try not to escalate diuretic Rx   Jenkins Rouge

## 2017-11-02 ENCOUNTER — Ambulatory Visit: Payer: Medicare HMO | Admitting: Cardiovascular Disease

## 2017-11-02 ENCOUNTER — Encounter: Payer: Self-pay | Admitting: Cardiovascular Disease

## 2017-11-02 VITALS — BP 142/92 | HR 78 | Ht 73.0 in | Wt 283.0 lb

## 2017-11-02 DIAGNOSIS — E785 Hyperlipidemia, unspecified: Secondary | ICD-10-CM | POA: Diagnosis not present

## 2017-11-02 DIAGNOSIS — I10 Essential (primary) hypertension: Secondary | ICD-10-CM

## 2017-11-02 DIAGNOSIS — I25708 Atherosclerosis of coronary artery bypass graft(s), unspecified, with other forms of angina pectoris: Secondary | ICD-10-CM

## 2017-11-02 MED ORDER — NITROGLYCERIN 0.4 MG SL SUBL: 0.4000 mg | SUBLINGUAL_TABLET | SUBLINGUAL | 3 refills | Status: DC | PRN

## 2017-11-02 NOTE — Patient Instructions (Addendum)

## 2017-11-04 ENCOUNTER — Other Ambulatory Visit: Payer: Self-pay | Admitting: Family Medicine

## 2017-11-04 ENCOUNTER — Other Ambulatory Visit: Payer: Self-pay | Admitting: Cardiovascular Disease

## 2017-11-08 ENCOUNTER — Other Ambulatory Visit: Payer: Self-pay | Admitting: Family Medicine

## 2017-11-09 ENCOUNTER — Other Ambulatory Visit: Payer: Self-pay | Admitting: Cardiovascular Disease

## 2018-01-13 DIAGNOSIS — M25561 Pain in right knee: Secondary | ICD-10-CM | POA: Diagnosis not present

## 2018-01-13 DIAGNOSIS — M7651 Patellar tendinitis, right knee: Secondary | ICD-10-CM | POA: Diagnosis not present

## 2018-02-04 ENCOUNTER — Other Ambulatory Visit: Payer: Self-pay | Admitting: Family Medicine

## 2018-03-04 ENCOUNTER — Other Ambulatory Visit: Payer: Self-pay | Admitting: Family Medicine

## 2018-03-14 ENCOUNTER — Encounter: Payer: Self-pay | Admitting: Family Medicine

## 2018-03-14 ENCOUNTER — Ambulatory Visit (INDEPENDENT_AMBULATORY_CARE_PROVIDER_SITE_OTHER): Payer: Medicare HMO | Admitting: Family Medicine

## 2018-03-14 VITALS — BP 134/84 | HR 80 | Temp 98.1°F | Ht 73.5 in | Wt 275.8 lb

## 2018-03-14 DIAGNOSIS — E88819 Insulin resistance, unspecified: Secondary | ICD-10-CM

## 2018-03-14 DIAGNOSIS — E8881 Metabolic syndrome: Secondary | ICD-10-CM

## 2018-03-14 DIAGNOSIS — E785 Hyperlipidemia, unspecified: Secondary | ICD-10-CM

## 2018-03-14 DIAGNOSIS — Z0000 Encounter for general adult medical examination without abnormal findings: Secondary | ICD-10-CM | POA: Diagnosis not present

## 2018-03-14 DIAGNOSIS — Z125 Encounter for screening for malignant neoplasm of prostate: Secondary | ICD-10-CM | POA: Diagnosis not present

## 2018-03-14 DIAGNOSIS — I10 Essential (primary) hypertension: Secondary | ICD-10-CM

## 2018-03-14 LAB — COMPREHENSIVE METABOLIC PANEL
ALBUMIN: 4.7 g/dL (ref 3.5–5.2)
ALK PHOS: 61 U/L (ref 39–117)
ALT: 42 U/L (ref 0–53)
AST: 41 U/L — AB (ref 0–37)
BILIRUBIN TOTAL: 1.5 mg/dL — AB (ref 0.2–1.2)
BUN: 18 mg/dL (ref 6–23)
CALCIUM: 10.2 mg/dL (ref 8.4–10.5)
CO2: 32 mEq/L (ref 19–32)
CREATININE: 0.94 mg/dL (ref 0.40–1.50)
Chloride: 98 mEq/L (ref 96–112)
GFR: 85.1 mL/min (ref >60.00)
Glucose, Bld: 118 mg/dL — ABNORMAL HIGH (ref 70–99)
Potassium: 4.7 mEq/L (ref 3.5–5.1)
Sodium: 138 mEq/L (ref 135–145)
TOTAL PROTEIN: 7.9 g/dL (ref 6.0–8.3)

## 2018-03-14 LAB — CBC
HCT: 40.3 % (ref 39.0–52.0)
HEMOGLOBIN: 13.7 g/dL (ref 13.0–17.0)
MCHC: 34.1 g/dL (ref 30.0–36.0)
MCV: 95.5 fl (ref 78.0–100.0)
PLATELETS: 184 10*3/uL (ref 150.0–400.0)
RBC: 4.22 Mil/uL (ref 4.22–5.81)
RDW: 14.8 % (ref 11.5–15.5)
WBC: 5.5 10*3/uL (ref 4.0–10.5)

## 2018-03-14 LAB — LIPID PANEL
Cholesterol: 189 mg/dL (ref 0–200)
HDL: 52.9 mg/dL (ref >39.00)
NONHDL: 136.28
TRIGLYCERIDES: 223 mg/dL — AB (ref 0.0–149.0)
Total CHOL/HDL Ratio: 4
VLDL: 44.6 mg/dL — ABNORMAL HIGH (ref 0.0–40.0)

## 2018-03-14 LAB — HEMOGLOBIN A1C: Hgb A1c MFr Bld: 6.6 % — ABNORMAL HIGH (ref 4.6–6.5)

## 2018-03-14 LAB — LDL CHOLESTEROL, DIRECT: Direct LDL: 107 mg/dL

## 2018-03-14 LAB — PSA: PSA: 0.73 ng/mL (ref 0.10–4.00)

## 2018-03-14 NOTE — Patient Instructions (Addendum)
Health Maintenance Due  Topic Date Due  . INFLUENZA VACCINE - would love for you to get this in the fall 03/02/2018   Please stop by lab before you go  No changes today- continue your weight loss efforts. Would love for your liver to see you cut alcohol to 0-1 glasses per day.

## 2018-03-14 NOTE — Progress Notes (Signed)
Phone: [phone]  Subjective:  Patient presents today for their annual physical. Chief complaint-noted.   See problem oriented charting- ROS- full  review of systems was completed and negative including No chest pain or shortness of breath. No headache or blurry vision.    The following were reviewed and entered/updated in epic: Past Medical History:  Diagnosis Date  . 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 Arthritis   . CAD (coronary artery disease)    seen by Dr. Johnsie Cancel  . Foot swelling   . Gout   . History of kidney stones   . Hyperlipidemia   . Hypertension   . Myocardial infarction ([place]) 1997  . Nocturia   . Obesity   . Patellar tendonitis   . Personal history of colonic polyp-adenoma  11/20/2013  . Plantar fasciitis    Patient Active Problem List   Diagnosis Date Noted  . CAD (coronary artery disease) 02/21/2007    Priority: High  . Nonspecific elevation of levels of transaminase or lactic acid dehydrogenase (LDH) 12/19/2012    Priority: Medium  . Gout 09/01/2009    Priority: Medium  . Hyperlipidemia 02/21/2007    Priority: Medium  . Essential hypertension 02/21/2007    Priority: Medium  . Plantar fasciitis     Priority: Low  . Left hip pain 11/20/2014    Priority: Low  . Dermatitis 11/20/2014    Priority: Low  . Personal history of colonic polyp-adenoma  11/20/2013    Priority: Low  . Obese 06/13/2013    Priority: Low  . S/P right THA, AA 06/12/2013    Priority: Low  . Left foot pain 12/19/2012    Priority: Low  . Actinic keratosis 08/03/2012    Priority: Low  . Kidney stone 02/21/2007    Priority: Low   Past Surgical History:  Procedure Laterality Date  . CORONARY ARTERY BYPASS GRAFT  1997   3 VESSELS  . HEMICOLECTOMY Right 2007   low grade mucnous appendiceal neoplasm  . HERNIA REPAIR    . TONSILLECTOMY    . TOTAL HIP ARTHROPLASTY Right 06/12/2013   Procedure: RIGHT TOTAL HIP ARTHROPLASTY ANTERIOR APPROACH;  Surgeon:  Mauri Pole, MD;  Location: [facility];  Service: Orthopedics;  Laterality: Right;    Family History  Problem Relation Age of Onset  . Stroke Mother   . Heart disease Mother   . Aneurysm Father   . Colon cancer Neg Hx   . Esophageal cancer Neg Hx   . Rectal cancer Neg Hx   . Stomach cancer Neg Hx     Medications- reviewed and updated Current Outpatient Medications  Medication Sig Dispense Refill  . amoxicillin (AMOXIL) 500 MG capsule Take 500 mg by mouth 3 (three) times daily. Pt takes for dental appts    . aspirin EC 81 MG tablet Take 1 tablet (81 mg total) by mouth daily. 90 tablet 3  . carvedilol (COREG) 3.125 MG tablet TAKE ONE TABLET BY MOUTH TWICE A DAY WITH A MEAL 180 tablet 3  . losartan-hydrochlorothiazide (HYZAAR) 100-25 MG tablet Take 1 tablet by mouth daily. 90 tablet 3  . nitroGLYCERIN (NITROSTAT) 0.4 MG SL tablet Place 1 tablet (0.4 mg total) under the tongue every 5 (five) minutes as needed. 25 tablet 3  . rosuvastatin (CRESTOR) 40 MG tablet TAKE ONE TABLET BY MOUTH DAILY 30 tablet 0   No current facility-administered medications for this visit.     Allergies-reviewed and updated Allergies  Allergen Reactions  .  Gadolinium Derivatives Shortness Of Breath, Itching, Swelling, Cough and Anaphylaxis    Symptoms include: SOB, wheezing, throat numbness, cough, congestion of nasal cavities, itching of hand and foot, and swelling. Contrast agent given- MultiHance.  Raelyn Ensign Venom     Social History   Social History Narrative   Married. No kids. 1 stapdaughter 31- no grandkids. 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       Hobbies: enjoys walking (plantar fasciitis inhibits and also patellar tendonitis)    Objective: BP 134/84 (BP Location: Left Arm, Patient Position: Sitting, Cuff Size: Large)   Pulse 80   Temp 98.1 F (36.7 C) (Oral)   Ht  6' 1.5" (1.867 m)   Wt 275 lb 12.8 oz (125.1 kg)   SpO2 97%   BMI 35.89 kg/m  Gen: NAD, resting comfortably HEENT: Mucous membranes are moist. Oropharynx normal Neck: no thyromegaly CV: RRR no murmurs rubs or gallops Lungs: CTAB no crackles, wheeze, rhonchi Abdomen: soft/nontender/nondistended/normal bowel sounds. No rebound or guarding. Upper abdominal hernia unchanged Ext: trace edema R>L Skin: warm, dry Neuro: grossly normal, moves all extremities, PERRLA  Assessment/Plan:  67 y.o. male presenting for annual physical.  Health Maintenance counseling: 1. Anticipatory guidance: Patient counseled regarding regular dental exams -q6 months, eye exams -yearly- needs update, wearing seatbelts.  2. Risk factor reduction:  Advised patient of need for regular exercise and diet rich and fruits and vegetables to reduce risk of heart attack and stroke. Weight down 6 lbs from last march- congratulated on efforts. Exercise- Walked 630 miles since January 1st- remaining very active. Diet-he states actually got up to 289 so has even more weight loss than our scales are showing. .  Wt Readings from Last 3 Encounters:  03/14/18 275 lb 12.8 oz (125.1 kg)  11/02/17 283 lb (128.4 kg)  10/28/16 281 lb 8 oz (127.7 kg)  3. Immunizations/screenings/ancillary studies- advised fall flu shot. Discussed shingrix - he wants to hold off for now given possible flu like symptoms.  Immunization History  Administered Date(s) Administered  . Pneumococcal Conjugate-13 11/20/2014  . Pneumococcal Polysaccharide-23 09/27/2016  . Tdap 01/29/2008, 02/26/2013  . Zoster 08/22/2013   4. Prostate cancer screening-  update psa trend today. Prior trend low risk. He declines rectal exam. Denies urinary issues. Pees up to 3x a night but this is not worsening and drinks close to bedtime.  Lab Results  Component Value Date   PSA 0.56 11/13/2014   PSA 0.49 08/22/2013   PSA 0.55 07/12/2011   5. Colon cancer screening - 11/14/13  with 5 year repeat due to adenoma 6. Skin cancer screening- no dermatologist- has seen Dr. Allyson Sabal in the past. advised regular sunscreen use. Denies worrisome, changing, or new skin lesions.   Status of chronic or acute concerns   CAD- triple bypass CABG before 2000. Walked 630 miles since January 1st- remaining very acive. Encouraged daily aspirin- he has been missing some doses  HTN- controlled on losartan hctz 100-25mg  and coreg at low dose. Has gout so would prefer off hctz but amlodipine would likely worsen edema.   Gout-  only 1 gout flare in last year- triggered by high shellfish and alcohol diet in [place]. Uric acid high but doesn't want to take allopurinol.  Uses prn indomethacin Lab Results  Component Value Date   LABURIC 7.7 03/13/2013   HLD- on crestor 40mg . LDL  goal under 70 ideally. . Could consider adding zetia but cardiology has not pushed for this so will hold off as long as LDL undre 100 and he continues weight loss.   Need to update LFTs- have advised 0 alcohol if possible with prior LFT elevation. 3-4 glasses of red wine. Encouraged to cut down for LFts and weight loss  Hyperglycemia- hoping this has improved with more regular walking. hasnt been checked in a while Lab Results  Component Value Date   HGBA1C 6.0 08/22/2013     No problem-specific Assessment & Plan notes found for this encounter.   No future appointments. No follow-ups on file.  Lab/Order associations: No diagnosis found.  No orders of the defined types were placed in this encounter.   Return precautions advised.  Garret Reddish, MD

## 2018-03-15 ENCOUNTER — Telehealth: Payer: Self-pay | Admitting: *Deleted

## 2018-03-15 ENCOUNTER — Other Ambulatory Visit: Payer: Self-pay

## 2018-03-15 MED ORDER — METFORMIN HCL 500 MG PO TABS: 500.0000 mg | ORAL_TABLET | Freq: Every day | ORAL | 3 refills | Status: DC

## 2018-03-15 NOTE — Telephone Encounter (Signed)
Copied from [place]. Topic: General - Call Back - No Documentation >> Mar 15, 2018  3:42 PM Reyne Dumas L wrote: Reason for CRM:   Pt states someone called 5 minutes ago from office but didn't leave message.  Pt can be reached at [phone]

## 2018-03-15 NOTE — Telephone Encounter (Signed)
Called and left message on voicemail that I called and saw that someone had given him lab results already so I did not leave a message. I hung up before machine answered. If any questions you can call office back.

## 2018-03-16 ENCOUNTER — Telehealth: Payer: Self-pay | Admitting: Cardiovascular Disease

## 2018-03-16 NOTE — Telephone Encounter (Signed)
New message:   Patient calling about some results he had with his family  Dr.Hunter That he would like to discuss with the nurse or Dr. Johnsie Cancel. Please call patient. Also patient has 2  New prescription  From the family dr, that he has not picked up yet. He need some advice.

## 2018-03-16 NOTE — Telephone Encounter (Signed)
Called patient back. Patient wanted Dr. Johnsie Cancel to know his LDL is up and his HgbA1c was high, so Dr. Yong Channel started patient on metformin and zetia. Informed patient that these medications are fine to take with his other medications. Informed patient that he should limit his alcohol to 0-1 per day as Dr. Yong Channel recommended. Patient's lab work can be reviewed in [facility]. Informed patient that Dr. Johnsie Cancel is out of the office this week, and that a message would be sent to him. Patient is going to try metformin and zetia and see how he feels. Patient stated he walks a lot and he has lost 15 lbs. Informed patient to keep exercising and watch what he eats. Patient verbalized understanding.

## 2018-03-17 ENCOUNTER — Telehealth: Payer: Self-pay | Admitting: *Deleted

## 2018-03-17 MED ORDER — EZETIMIBE 10 MG PO TABS: 10.0000 mg | ORAL_TABLET | Freq: Every day | ORAL | 3 refills | Status: DC

## 2018-03-17 NOTE — Telephone Encounter (Signed)
Called patient to schedule AWV. He states that he went to the pharmacy to pick up the prescriptions from his last office visit and the pharmacy did not receive a prescription for Zetia. He did pick up the Metformin. I do not see Zetia on his medication list. Looks like Dr Yong Channel prescribed under lab results. Please review and send in if still applicable.

## 2018-03-17 NOTE — Telephone Encounter (Signed)
Any other alternatives for patient that's not costly?

## 2018-03-17 NOTE — Telephone Encounter (Signed)
Spoke to pt told him I will send Rx in for Zetia, do you want local pharmacy for mail order? Pt said local pharmacy. Told pt okay I will send to pharmacy. Take Zetia 10 mg one tablet daily. Pt verbalized understanding. Rx sent to pharmacy.

## 2018-03-17 NOTE — Telephone Encounter (Signed)
I would have him focus on healthy eating, regular exercise and making sure to take rosuvastatin everyday. Remove zetia from his med list- there is not a great cheaper option here.

## 2018-03-17 NOTE — Telephone Encounter (Signed)
Pt states that the ezetimibe (ZETIA) 10 MG tablet  Is a tier 4 drug and would cost him over $300 and he is unable to afford that medicine. He would like a call from the nurse to discuss.

## 2018-03-17 NOTE — Addendum Note (Signed)
Addended by: Marian Sorrow on: 03/17/2018 12:06 PM   Modules accepted: Orders

## 2018-03-20 NOTE — Telephone Encounter (Signed)
Left message on voicemail to call office.  

## 2018-03-21 NOTE — Telephone Encounter (Signed)
Spoke to pt, told him Dr. Yong Channel said he would have you focus on healthy eating, regular exercise and making sure to take rosuvastatin everyday. There is no cheaper option for Zetia so he is going to not have you take it. Pt verbalized understanding.

## 2018-03-21 NOTE — Addendum Note (Signed)
Addended by: Marian Sorrow on: 03/21/2018 09:16 AM   Modules accepted: Orders

## 2018-04-03 ENCOUNTER — Other Ambulatory Visit: Payer: Self-pay | Admitting: Family Medicine

## 2018-05-15 ENCOUNTER — Other Ambulatory Visit: Payer: Medicare HMO

## 2018-05-15 ENCOUNTER — Telehealth: Payer: Self-pay

## 2018-05-15 DIAGNOSIS — E785 Hyperlipidemia, unspecified: Secondary | ICD-10-CM

## 2018-05-15 DIAGNOSIS — E88819 Insulin resistance, unspecified: Secondary | ICD-10-CM

## 2018-05-15 DIAGNOSIS — E8881 Metabolic syndrome: Secondary | ICD-10-CM

## 2018-05-15 NOTE — Telephone Encounter (Signed)
Team please see 03/14/18 phone note. Looks like labs didn't get ordered as intended.   Team please order cmet and LDL under hyperlipidemia and a1c under insulin resistance

## 2018-05-15 NOTE — Telephone Encounter (Signed)
Pt coming for labs tomorrow morning, 05/16/18. Please place future order/s. Thank you.

## 2018-05-16 ENCOUNTER — Ambulatory Visit: Payer: Medicare HMO

## 2018-05-16 ENCOUNTER — Other Ambulatory Visit: Payer: Medicare HMO

## 2018-05-16 NOTE — Telephone Encounter (Signed)
Entered as requested-

## 2018-07-17 ENCOUNTER — Other Ambulatory Visit: Payer: Medicare HMO

## 2018-07-20 ENCOUNTER — Other Ambulatory Visit (INDEPENDENT_AMBULATORY_CARE_PROVIDER_SITE_OTHER): Payer: Medicare HMO

## 2018-07-20 DIAGNOSIS — E8881 Metabolic syndrome: Secondary | ICD-10-CM

## 2018-07-20 DIAGNOSIS — E785 Hyperlipidemia, unspecified: Secondary | ICD-10-CM

## 2018-07-20 LAB — COMPREHENSIVE METABOLIC PANEL
ALBUMIN: 4.5 g/dL (ref 3.5–5.2)
ALK PHOS: 54 U/L (ref 39–117)
ALT: 27 U/L (ref 0–53)
AST: 30 U/L (ref 0–37)
BILIRUBIN TOTAL: 0.9 mg/dL (ref 0.2–1.2)
BUN: 15 mg/dL (ref 6–23)
CALCIUM: 9.6 mg/dL (ref 8.4–10.5)
CO2: 32 mEq/L (ref 19–32)
Chloride: 95 mEq/L — ABNORMAL LOW (ref 96–112)
Creatinine, Ser: 0.83 mg/dL (ref 0.40–1.50)
GFR: 98.14 mL/min (ref >60.00)
Glucose, Bld: 110 mg/dL — ABNORMAL HIGH (ref 70–99)
POTASSIUM: 4.5 meq/L (ref 3.5–5.1)
Sodium: 136 mEq/L (ref 135–145)
TOTAL PROTEIN: 7.4 g/dL (ref 6.0–8.3)

## 2018-07-20 LAB — HEMOGLOBIN A1C: Hgb A1c MFr Bld: 6.3 % (ref 4.6–6.5)

## 2018-07-20 LAB — LDL CHOLESTEROL, DIRECT: Direct LDL: 81 mg/dL

## 2018-07-25 ENCOUNTER — Telehealth: Payer: Self-pay | Admitting: Family Medicine

## 2018-07-25 NOTE — Telephone Encounter (Signed)
See note  Copied from [place] [id number]. Topic: Quick Communication - Lab Results (Clinic Use ONLY) >> Jul 25, 2018  9:40 AM [place], [place], [place] wrote: Called patient to inform them of 07/21/2018 lab results. When patient returns call, triage nurse may disclose results. >> Jul 25, 2018 10:15 AM Carolyn Stare wrote:   Pt call to let Roselyn Reef know he read his lab results and he is happy with them

## 2018-07-28 NOTE — Telephone Encounter (Signed)
Noted  

## 2018-10-27 ENCOUNTER — Other Ambulatory Visit: Payer: Self-pay | Admitting: Cardiovascular Disease

## 2018-11-07 ENCOUNTER — Ambulatory Visit: Payer: Medicare HMO | Admitting: Cardiovascular Disease

## 2018-12-11 ENCOUNTER — Encounter: Payer: Self-pay | Admitting: Internal Medicine

## 2018-12-14 ENCOUNTER — Other Ambulatory Visit: Payer: Self-pay | Admitting: Cardiovascular Disease

## 2018-12-14 MED ORDER — NITROGLYCERIN 0.4 MG SL SUBL: 0.4000 mg | SUBLINGUAL_TABLET | SUBLINGUAL | 2 refills | Status: DC | PRN

## 2018-12-14 NOTE — Telephone Encounter (Signed)
 *  STAT* If patient is at the pharmacy, call can be transferred to refill team.   1. Which medications need to be refilled? (please list name of each medication and dose if known) nitroGLYCERIN (NITROSTAT) 0.4 MG SL tablet  2. Which pharmacy/location (including street and city if local pharmacy) is medication to be sent to? [facility]  3. Do they need a 30 day or 90 day supply? [place]

## 2018-12-14 NOTE — Telephone Encounter (Signed)
Pt's medication was sent to pt's pharmacy as requested. Confirmation received.  °

## 2019-01-31 ENCOUNTER — Ambulatory Visit: Payer: Medicare HMO | Admitting: Cardiovascular Disease

## 2019-02-08 ENCOUNTER — Encounter: Payer: Self-pay | Admitting: Family Medicine

## 2019-02-08 ENCOUNTER — Ambulatory Visit (INDEPENDENT_AMBULATORY_CARE_PROVIDER_SITE_OTHER): Payer: Medicare HMO | Admitting: Family Medicine

## 2019-02-08 VITALS — BP 135/90 | HR 73 | Ht 73.5 in | Wt 266.0 lb

## 2019-02-08 DIAGNOSIS — R739 Hyperglycemia, unspecified: Secondary | ICD-10-CM | POA: Diagnosis not present

## 2019-02-08 DIAGNOSIS — E669 Obesity, unspecified: Secondary | ICD-10-CM | POA: Diagnosis not present

## 2019-02-08 DIAGNOSIS — H1013 Acute atopic conjunctivitis, bilateral: Secondary | ICD-10-CM | POA: Diagnosis not present

## 2019-02-08 MED ORDER — FLUTICASONE PROPIONATE 50 MCG/ACT NA SUSP: 2.0000 | Freq: Every day | NASAL | 1 refills | Status: DC

## 2019-02-08 MED ORDER — OLOPATADINE HCL 0.2 % OP SOLN: OPHTHALMIC | 1 refills | Status: DC

## 2019-02-08 NOTE — Progress Notes (Signed)
Phone [phone]   Subjective:  Virtual visit via Video note. Chief complaint: Chief Complaint  Patient presents with  . Eye redness   This visit type was conducted due to national recommendations for restrictions regarding the COVID-19 Pandemic (e.g. social distancing).  This format is felt to be most appropriate for this patient at this time balancing risks to patient and risks to population by having him in for in person visit.  No physical exam was performed (except for noted visual exam or audio findings with Telehealth visits).    Our team/I connected with Shawn Anderson at  3:00 PM EDT by a video enabled telemedicine application (doxy.me or caregility through epic) and verified that I am speaking with the correct person using two identifiers.  Location patient: [place] Location provider: [facility], office Persons participating in the virtual visit:  patient  Our team/I discussed the limitations of evaluation and management by telemedicine and the availability of in person appointments. In light of current covid-19 pandemic, patient also understands that we are trying to protect them by minimizing in office contact if at all possible.  The patient expressed consent for telemedicine visit and agreed to proceed. Patient understands insurance will be billed.   ROS-reports flulike illness with fever/chills/cough back in March which resolved.  Currently with mildly red and sometimes itchy eyes  Past Medical History-  Patient Active Problem List   Diagnosis Date Noted  . CAD (coronary artery disease) 02/21/2007    Priority: High  . Nonspecific elevation of levels of transaminase or lactic acid dehydrogenase (LDH) 12/19/2012    Priority: Medium  . Gout 09/01/2009    Priority: Medium  . Hyperlipidemia 02/21/2007    Priority: Medium  . Essential hypertension 02/21/2007    Priority: Medium  . Plantar fasciitis     Priority: Low  . Left hip pain 11/20/2014    Priority: Low  .  Dermatitis 11/20/2014    Priority: Low  . Personal history of colonic polyp-adenoma  11/20/2013    Priority: Low  . Obese 06/13/2013    Priority: Low  . S/P right THA, AA 06/12/2013    Priority: Low  . Left foot pain 12/19/2012    Priority: Low  . Actinic keratosis 08/03/2012    Priority: Low  . Kidney stone 02/21/2007    Priority: Low    Medications- reviewed and updated Current Outpatient Medications  Medication Sig Dispense Refill  . amoxicillin (AMOXIL) 500 MG capsule Take 500 mg by mouth 3 (three) times daily. Pt takes for dental appts    . carvedilol (COREG) 3.125 MG tablet TAKE ONE TABLET BY MOUTH TWICE A DAY WITH A MEAL 180 tablet 1  . indomethacin (INDOCIN) 25 MG capsule indomethacin 25 mg capsule  TAKE TWO CAPSULES PER DAY AFTER MEALS AS NEEDED    . losartan-hydrochlorothiazide (HYZAAR) 100-25 MG tablet TAKE ONE TABLET BY MOUTH DAILY 90 tablet 1  . metFORMIN (GLUCOPHAGE) 500 MG tablet Take 1 tablet (500 mg total) by mouth daily with breakfast. 90 tablet 3  . nitroGLYCERIN (NITROSTAT) 0.4 MG SL tablet Place 1 tablet (0.4 mg total) under the tongue every 5 (five) minutes as needed. Please keep upcoming appt with Dr. Johnsie Cancel. Thank you 25 tablet 2  . rosuvastatin (CRESTOR) 40 MG tablet TAKE ONE TABLET BY MOUTH DAILY 90 tablet 3  . aspirin EC 81 MG tablet Take 1 tablet (81 mg total) by mouth daily. (Patient not taking: Reported on 02/08/2019) 90 tablet 3   No current facility-administered  medications for this visit.      Objective:  BP 135/90   Pulse 73   Ht 6' 1.5" (1.867 m)   Wt 266 lb (120.7 kg)   BMI 34.62 kg/m  self reported vitals Gen: NAD, resting comfortably Lungs: nonlabored, normal respiratory rate  Skin: appears dry, no obvious rash Eye: Limited exam through video- and upper conjunctiva on the right eye-small red dot in area patient stated there was a prior stye 2 months ago.  Bilaterally sclera is mildly red.  Conjunctiva appears mildly red.  Stye 2 months  ago right eye upper lid. Lingering red dog afterwards. Sclera mildly red diffusesly, conjunctiva red particularly in morning- feels like left eye grainy on top.     Assessment and Plan   #Red eyes/mildly itchy S: Patient has been having issues with red eyes for about 2 months now.  Patient states the eyes both the sclera and the conjunctiva are red each morning and he notes discharge-sometimes this is just clear and watery.  Eyes tend to get better during the day. Mildly itchy in AM- artificial tears help.  Patient wondered if sinuses or allergies were involved- seems to be slightly better with this.  He also tried Neomed sinus rinse today with mild relief.  Patient denies pain in the eyes or vision changes.  He does state preceding this that he noted he needed to get new glasses for the right eye-this dates back to his last eye exam and he never seen one for glasses were a good fit for him.  Symptoms are not worsening at this time but the persistence of symptoms concerned him so we opted to schedule visit today.  Patient wears glasses but not contacts.  No obvious chemical or irritant exposure in the eye or high trauma on initial reflection.  Later noted- at the beginning of this-Bought a pressure washer- was doing driveway- was just using water. At edge of driveway lots of pollen and clay and ciprus needles flying up and did get near his eyes Also stained his deck.   Patient states symptoms started after he had a stye in his right eye- he "popped" the stye and had a lingering red spot in that area since that time.  Shortly after developed the redness as described above.  Patient also wonders about allergies-clear rhinorrhea for several months. A/P: Given duration of red sclera and conjunctiva along with rhinorrhea-strongly suspect allergic conjunctivitis.  We will start by trying Pataday eyedrops for 7 to 10 days-if no improvement he will try Flonase for 2 to 3 weeks.  If that does not improve  symptoms- he agrees to see ophthalmology.  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 - Although I suspect rhinorrhea is related to seasonal allergies- offered COVID-19 testing and patient will call back if he changes his mind- he has a no family member he would like to care for and would want testing before being around them-can send me my chart message or call us.  He actually wonders if he had coronavirus/COVID-19 since he had a flulike illness at the beginning of March.   # Obesity/hyperglycemia S: Patient WUXLKG 401 miles since January- was over 1200 last year. 90 miles a month for the most part.  He is trying to eat a reasonable diet.  He is now down 17 pounds since last April and 9 pounds  from last August.  Patient remains on metformin-A1c in August was 6.3. Wt Readings from Last 3 Encounters:  02/08/19 266 lb (120.7 kg)  03/14/18 275 lb 12.8 oz (125.1 kg)  11/02/17 283 lb (128.4 kg)  A/P: Congratulated patient on his efforts and encouraged him to continue- Encouraged need for healthy eating, regular exercise, weight loss.   Thrilled by patient's weight loss-hopeful this helps prevent diabetes along with metformin-he wants to schedule a visit sometime before his cardiology visit to update labs including cholesterol and A1c-she will call to schedule (in retrospect seems he may be requesting labs but not actual visit with me- id be ok ordering based off this visit)  We originally planned for sooner follow-up on A1c but getting continue weight loss I think risk for development of diabetes is low  Future Appointments  Date Time Provider [place]  04/20/2019  9:15 AM Josue Hector, MD [id number]   Lab/Order associations:   ICD-10-CM   1. Allergic conjunctivitis of both eyes  H10.13   2. Obesity (BMI 30-39.9)  E66.9   3.  Hyperglycemia  R73.9     Meds ordered this encounter  Medications  . Olopatadine HCl 0.2 % SOLN    Sig: 1 drop in each eye daily    Dispense:  2.5 mL    Refill:  1  . fluticasone (FLONASE) 50 MCG/ACT nasal spray    Sig: Place 2 sprays into both nostrils daily.    Dispense:  16 g    Refill:  1    Return precautions advised.  Garret Reddish, MD

## 2019-02-08 NOTE — Patient Instructions (Addendum)
Health Maintenance Due  Topic Date Due  . COLONOSCOPY Pt postponing until Covid-19 calms 11/15/2018    Depression screen [facility] 2/9 03/14/2018 09/27/2016  Decreased Interest 0 0  Down, Depressed, Hopeless 0 0  PHQ - 2 Score 0 0   Patient is also wanting lab work done before he sees Dr.Nishan- he agrees to call back to schedule

## 2019-03-07 ENCOUNTER — Other Ambulatory Visit: Payer: Self-pay | Admitting: Family Medicine

## 2019-04-20 ENCOUNTER — Ambulatory Visit: Payer: Medicare HMO | Admitting: Cardiovascular Disease

## 2019-04-23 NOTE — Progress Notes (Addendum)
Shawn Anderson is a 68 y.o. male here for a follow up of a pre-existing problem.  I acted as a Education administrator for [employer], PA-C Anselmo Pickler, LPN  History of Present Illness:   Chief Complaint  Patient presents with  . Inguinal Hernia    HPI   Inguinal hernia Pt c/o bulge right groin area x 2-3 weeks. He is able to push it back in. He is having some discomfort when moving bowels. Denies back pain.  He did have a recent bout of constipation and felt like this may have worsened things.  He was also recently working on his gutters and did some heavy lifting and straining with that as well.  He is having some slight change in his bowels, normal bowel in the morning but his bowels throughout the rest of the day are pencil thin.  He is overdue for colonoscopy with Dr. Arelia Longest, was canceled due to [facility].  He does have a significant surgical history when it comes to his abdomen.  He had an appendiceal tumor removed, and then ended up having an incisional hernia that had to urgently be repaired.    Past Medical History:  Diagnosis Date  . 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 Arthritis   . CAD (coronary artery disease)    seen by Dr. Johnsie Cancel  . Foot swelling   . Gout   . History of kidney stones   . Hyperlipidemia   . Hypertension   . Myocardial infarction ([place]) 1997  . Nocturia   . Obesity   . Patellar tendonitis   . Personal history of colonic polyp-adenoma  11/20/2013  . Plantar fasciitis      Social History   Socioeconomic History  . Marital status: Married    Spouse name: Not on file  . Number of children: Not on file  . Years of education: Not on file  . Highest education level: Not on file  Occupational History  . Occupation: Product manager: [employer]    Comment: Patent attorney  Social Needs  . Financial resource strain: Not on file  . Food insecurity    Worry: Not on file    Inability: Not on file  . Transportation needs     Medical: Not on file    Non-medical: Not on file  Tobacco Use  . Smoking status: Never Smoker  . Smokeless tobacco: Never Used  Substance and Sexual Activity  . Alcohol use: Yes    Alcohol/week: 14.0 - 21.0 standard drinks    Types: 14 - 21 Glasses of wine per week    Comment: 2-3 gl wine daily  . Drug use: No  . Sexual activity: Not on file  Lifestyle  . Physical activity    Days per week: Not on file    Minutes per session: Not on file  . Stress: Not on file  Relationships  . Social Herbalist on phone: Not on file    Gets together: Not on file    Attends religious service: Not on file    Active member of club or organization: Not on file    Attends meetings of clubs or organizations: Not on file    Relationship status: Not on file  . Intimate partner violence    Fear of current or ex partner: Not on file    Emotionally abused: Not on file    Physically abused: Not on file  Forced sexual activity: Not on file  Other Topics Concern  . Not on file  Social History Narrative   Married. No kids. 1 stapdaughter 31- no grandkids. 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       Hobbies: enjoys walking (plantar fasciitis inhibits and also patellar tendonitis)    Past Surgical History:  Procedure Laterality Date  . CORONARY ARTERY BYPASS GRAFT  1997   3 VESSELS  . HEMICOLECTOMY Right 2007   low grade mucnous appendiceal neoplasm  . HERNIA REPAIR    . TONSILLECTOMY    . TOTAL HIP ARTHROPLASTY Right 06/12/2013   Procedure: RIGHT TOTAL HIP ARTHROPLASTY ANTERIOR APPROACH;  Surgeon: Mauri Pole, MD;  Location: [facility];  Service: Orthopedics;  Laterality: Right;    Family History  Problem Relation Age of Onset  . Stroke Mother   . Heart disease Mother   . Aneurysm Father   . Colon cancer Neg Hx   . Esophageal cancer Neg Hx   . Rectal  cancer Neg Hx   . Stomach cancer Neg Hx     Allergies  Allergen Reactions  . Gadolinium Derivatives Shortness Of Breath, Itching, Swelling, Cough and Anaphylaxis    Symptoms include: SOB, wheezing, throat numbness, cough, congestion of nasal cavities, itching of hand and foot, and swelling. Contrast agent given- MultiHance.  . Bee Venom     Current Medications:   Current Outpatient Medications:  .  amoxicillin (AMOXIL) 500 MG capsule, Take 500 mg by mouth 3 (three) times daily. Pt takes for dental appts, Disp: , Rfl:  .  carvedilol (COREG) 3.125 MG tablet, TAKE ONE TABLET BY MOUTH TWICE A DAY WITH A MEAL, Disp: 180 tablet, Rfl: 1 .  indomethacin (INDOCIN) 25 MG capsule, indomethacin 25 mg capsule  TAKE TWO CAPSULES PER DAY AFTER MEALS AS NEEDED, Disp: , Rfl:  .  losartan-hydrochlorothiazide (HYZAAR) 100-25 MG tablet, TAKE ONE TABLET BY MOUTH DAILY, Disp: 90 tablet, Rfl: 1 .  metFORMIN (GLUCOPHAGE) 500 MG tablet, TAKE ONE TABLET BY MOUTH DAILY WITH BREAKFAST, Disp: 90 tablet, Rfl: 2 .  nitroGLYCERIN (NITROSTAT) 0.4 MG SL tablet, Place 1 tablet (0.4 mg total) under the tongue every 5 (five) minutes as needed. Please keep upcoming appt with Dr. Johnsie Cancel. Thank you, Disp: 25 tablet, Rfl: 2 .  rosuvastatin (CRESTOR) 40 MG tablet, TAKE ONE TABLET BY MOUTH DAILY, Disp: 90 tablet, Rfl: 3 .  aspirin EC 81 MG tablet, Take 1 tablet (81 mg total) by mouth daily. (Patient not taking: Reported on 02/08/2019), Disp: 90 tablet, Rfl: 3   Review of Systems:   ROS Negative unless otherwise specified per HPI.  Vitals:   Vitals:   04/24/19 1012  BP: (!) 160/100  Pulse: 78  Temp: 98.4 F (36.9 C)  TempSrc: Temporal  SpO2: 98%  Weight: 274 lb 4 oz (124.4 kg)  Height: 6' 1.5" (1.867 m)     Body mass index is 35.69 kg/m.  Physical Exam:   Physical Exam Vitals signs and nursing note reviewed.  Constitutional:      General: He is not in acute distress.    Appearance: He is well-developed. He is  not ill-appearing or toxic-appearing.  Cardiovascular:     Rate and Rhythm: Normal rate and regular rhythm.     Pulses: Normal pulses.     Heart sounds: Normal heart sounds, S1 normal and S2  normal.     Comments: No LE edema Pulmonary:     Effort: Pulmonary effort is normal.     Breath sounds: Normal breath sounds.  Abdominal:     Comments: Small bulge at distal area of right groin, reproducible  Skin:    General: Skin is warm and dry.  Neurological:     Mental Status: He is alert.     GCS: GCS eye subscore is 4. GCS verbal subscore is 5. GCS motor subscore is 6.  Psychiatric:        Speech: Speech normal.        Behavior: Behavior normal. Behavior is cooperative.      Assessment and Plan:   Santanna was seen today for inguinal hernia.  Diagnoses and all orders for this visit:  Lump in the groin; Intra-abdominal and pelvic swelling, mass and lump, unspecified site Given his extensive history of abdominal and surgical issues, will order a stat CT to further evaluate.  He he is aware of red flags to look out for that would warrant ER visit.  Did discuss that he needs to get back on track with his scheduled colonoscopies.  Will determine either outpatient surgery evaluation versus ER, pending results.  Patient verbalized understanding of plan.  **Patient was contacted by our office, after we learned that his insurance was potentially going take up to 1 week to approve CT scan.  We called patient and informed him of this.  Discussed his options, and he would like to proceed with urgent surgery referral for further evaluation and treatment.  He did verbalize understanding that if he has any worsening symptoms to go to the emergency room immediately.    -     Basic metabolic panel -     CT Abdomen Pelvis W Contrast; Future  . Reviewed expectations re: course of current medical issues. . Discussed self-management of symptoms. . Outlined signs and symptoms indicating need for more acute  intervention. . Patient verbalized understanding and all questions were answered. . See orders for this visit as documented in the electronic medical record. . Patient received an After-Visit Summary.  CMA or LPN served as scribe during this visit. History, Physical, and Plan performed by medical provider. The above documentation has been reviewed and is accurate and complete.   Inda Coke, PA-C

## 2019-04-24 ENCOUNTER — Telehealth: Payer: Self-pay | Admitting: Physical Therapy

## 2019-04-24 ENCOUNTER — Other Ambulatory Visit: Payer: Self-pay

## 2019-04-24 ENCOUNTER — Ambulatory Visit (INDEPENDENT_AMBULATORY_CARE_PROVIDER_SITE_OTHER): Payer: Medicare HMO | Admitting: Physician Assistant

## 2019-04-24 ENCOUNTER — Encounter: Payer: Self-pay | Admitting: Physician Assistant

## 2019-04-24 VITALS — BP 160/100 | HR 78 | Temp 98.4°F | Ht 73.5 in | Wt 274.2 lb

## 2019-04-24 DIAGNOSIS — R1900 Intra-abdominal and pelvic swelling, mass and lump, unspecified site: Secondary | ICD-10-CM | POA: Diagnosis not present

## 2019-04-24 DIAGNOSIS — R1909 Other intra-abdominal and pelvic swelling, mass and lump: Secondary | ICD-10-CM

## 2019-04-24 LAB — BASIC METABOLIC PANEL
BUN: 17 mg/dL (ref 6–23)
CO2: 30 mEq/L (ref 19–32)
Calcium: 9.8 mg/dL (ref 8.4–10.5)
Chloride: 96 mEq/L (ref 96–112)
Creatinine, Ser: 0.87 mg/dL (ref 0.40–1.50)
GFR: 87.26 mL/min (ref >60.00)
Glucose, Bld: 108 mg/dL — ABNORMAL HIGH (ref 70–99)
Potassium: 4.3 mEq/L (ref 3.5–5.1)
Sodium: 134 mEq/L — ABNORMAL LOW (ref 135–145)

## 2019-04-24 NOTE — Telephone Encounter (Signed)
Copied from [place] [id number]. Topic: General - Other >> Apr 24, 2019 11:48 AM Celene Kras A wrote: Reason for CRM: Pt called and is requesting to have Aldona Bar or Colletta [place] regarding his referral. Pt states his insurance told him it could be done quicker if it was expedited. Please advise.

## 2019-04-24 NOTE — Addendum Note (Signed)
Addended by: Erlene Quan on: 04/24/2019 11:37 AM   Modules accepted: Orders

## 2019-04-25 ENCOUNTER — Other Ambulatory Visit: Payer: Self-pay | Admitting: Cardiovascular Disease

## 2019-04-25 ENCOUNTER — Other Ambulatory Visit: Payer: Self-pay | Admitting: Family Medicine

## 2019-04-25 ENCOUNTER — Telehealth: Payer: Self-pay | Admitting: Family Medicine

## 2019-04-25 NOTE — Telephone Encounter (Signed)
Last CPE 03/2018. Please call pt to schedule CPE.

## 2019-04-25 NOTE — Telephone Encounter (Signed)
See note  Copied from [place] [phone]. Topic: Appointment Scheduling - Scheduling Inquiry for Clinic >> Apr 25, 2019  2:25 PM Pauline Good wrote: Reason for CRM: pt is calling and stating he want to proceed with the CT scan. Pt don't have an appt with [facility] until next week  Please call pt to advise

## 2019-04-25 NOTE — Telephone Encounter (Signed)
Patient has been scheduled for his CPE for 06/19/19 @ 10:40am

## 2019-04-26 NOTE — Telephone Encounter (Signed)
Spoke to pt, asked him when his appt is with surgeon? Pt said Wed. Told him referral coordinator has sent request but we have not heard anything yet it could take over a week and you need to keep your appt with the surgeon. Pt verbalized understanding just wanted to make sure was sent so if surgeon wants it done it will be approved quicker. Told him okay as soon as we here something we will let you know. Ptverbalized understanding.

## 2019-04-26 NOTE — Telephone Encounter (Signed)
Looks like this may have been ordered/suggested by Inda Coke on 04/24/19.

## 2019-04-26 NOTE — Telephone Encounter (Signed)
Shawn Anderson, please let pt know when approved for CT scan.

## 2019-05-02 DIAGNOSIS — K4090 Unilateral inguinal hernia, without obstruction or gangrene, not specified as recurrent: Secondary | ICD-10-CM | POA: Diagnosis not present

## 2019-05-02 DIAGNOSIS — R69 Illness, unspecified: Secondary | ICD-10-CM | POA: Diagnosis not present

## 2019-05-03 ENCOUNTER — Telehealth: Payer: Self-pay

## 2019-05-03 NOTE — Telephone Encounter (Signed)
   Primary Cardiologist:Peter Johnsie Cancel, MD  Chart reviewed as part of pre-operative protocol coverage. Because of Shawn Anderson's past medical history and time since last visit, he/she will require a follow-up visit in order to better assess preoperative cardiovascular risk.  Pre-op covering staff: - Please schedule appointment and call patient to inform them.  - pt has an appt with Dr. Johnsie Cancel in November, but does not want to wait that long. Please schedule with an APP ASAP, if Dr. Johnsie Cancel doesn't have anything sooner.   - Please contact requesting surgeon's office via preferred method (i.e, phone, fax) to inform them of need for appointment prior to surgery.  If applicable, this message will also be routed to pharmacy pool and/or primary cardiologist for input on holding anticoagulant/antiplatelet agent as requested below so that this information is available at time of patient's appointment.   [place], PA  05/03/2019, 4:13 PM

## 2019-05-03 NOTE — Telephone Encounter (Signed)
Sent successfully to [facility] via [employer].

## 2019-05-03 NOTE — Telephone Encounter (Signed)
    [facility] Pre-operative Risk Assessment    Request for surgical clearance:  1. What type of surgery is being performed? RIGHT INGUINAL HERNIA SURGERY   2. When is this surgery scheduled? TBD   3. What type of clearance is required (medical clearance vs. Pharmacy clearance to hold med vs. Both)?MEDICAL  4. Are there any medications that need to be held prior to surgery and how long? NONE   5. Practice name and name of physician performing surgery? [facility]; DR. Anne Hahn RAMIREZ   6. What is your office phone number [phone]    7.   What is your office fax number [phone]  8.   Anesthesia type (None, local, MAC, general) ? GENERAL    Shawn Anderson 05/03/2019, 3:05 PM  _________________________________________________________________   (provider comments below)

## 2019-05-04 NOTE — Telephone Encounter (Signed)
Follow Up:    Pt wants to know if he can have his EKG at his other doctor. We did not have any appointments in  The office available until 06-01-19. He said he needs to be seen   Before that time.

## 2019-05-07 NOTE — Telephone Encounter (Signed)
Yes the patient may have his EKG performed at another MD office however, would not be reviewed by cardiologist.

## 2019-05-08 NOTE — Telephone Encounter (Signed)
New Message    Patient returning your call about clearance.  Please give patient a call back.

## 2019-05-08 NOTE — Telephone Encounter (Signed)
Please schedule the patient for follow up with APP if no upcoming appointments with Dr. Johnsie Cancel and call the patient with date and time of appointment. He needs pre-operative clearance given the time lapse since last appointment.   Thank you  Kathyrn Drown NP-C [facility] Pager: [phone]

## 2019-05-09 DIAGNOSIS — R69 Illness, unspecified: Secondary | ICD-10-CM | POA: Diagnosis not present

## 2019-05-09 NOTE — Telephone Encounter (Signed)
I tried to reach pt to get him a sooner appt. Left message to call back and tell the operator know he is calling to get a sooner appt with Dr. Johnsie Cancel or APP for his surgery clearance.

## 2019-05-10 NOTE — Telephone Encounter (Signed)
Pt is scheduled to see Dr. Johnsie Cancel 05/24/19. I will send Dr. Johnsie Cancel surgery information for appt.

## 2019-05-22 NOTE — Progress Notes (Signed)
Cardiology Office Note    Date:  05/24/2019   ID:  WAVIE PINSKER, DOB 1950/11/01, MRN [id number]  PCP:  Marin Olp, MD  Cardiologist: Dr. Johnsie Cancel  CC: follow up   History of Present Illness:   68 y.o. CAD/CABG 1997 HTN and Gout. Last myovue 04/29/15 inferior scar no ischemia. Cardiac MRI 05/22/15 EF 42% so no AICD. Had reaction to gadolinium with hives and pruritis so should not have again. HTN Rx with coreg and ARB. Needs to be reminded to take ASA daily. Probable gouty knee arthritis better with allopurinol  Walking trails by [employer] a lot.   No angina despite vigorous activity   Needs right inguinal hernia surgery Not taking his coreg bid and BP/HR up a bit      Past Medical History:  Diagnosis Date  . 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 Arthritis   . CAD (coronary artery disease)    seen by Dr. Johnsie Cancel  . Foot swelling   . Gout   . History of kidney stones   . Hyperlipidemia   . Hypertension   . Myocardial infarction ([place]) 1997  . Nocturia   . Obesity   . Patellar tendonitis   . Personal history of colonic polyp-adenoma  11/20/2013  . Plantar fasciitis     Past Surgical History:  Procedure Laterality Date  . CORONARY ARTERY BYPASS GRAFT  1997   3 VESSELS  . HEMICOLECTOMY Right 2007   low grade mucnous appendiceal neoplasm  . HERNIA REPAIR    . TONSILLECTOMY    . TOTAL HIP ARTHROPLASTY Right 06/12/2013   Procedure: RIGHT TOTAL HIP ARTHROPLASTY ANTERIOR APPROACH;  Surgeon: Mauri Pole, MD;  Location: [facility];  Service: Orthopedics;  Laterality: Right;    Current Medications: Outpatient Medications Prior to Visit  Medication Sig Dispense Refill  . amoxicillin (AMOXIL) 500 MG capsule Take 500 mg by mouth 3 (three) times daily. Pt takes for dental appts    . aspirin EC 81 MG tablet Take 1 tablet (81 mg total) by mouth daily. 90 tablet 3  . carvedilol (COREG) 3.125 MG tablet TAKE ONE TABLET BY MOUTH TWICE A DAY WITH A MEAL 180  tablet 1  . indomethacin (INDOCIN) 25 MG capsule indomethacin 25 mg capsule  TAKE TWO CAPSULES PER DAY AFTER MEALS AS NEEDED    . losartan-hydrochlorothiazide (HYZAAR) 100-25 MG tablet TAKE ONE TABLET BY MOUTH DAILY 90 tablet 0  . metFORMIN (GLUCOPHAGE) 500 MG tablet TAKE ONE TABLET BY MOUTH DAILY WITH BREAKFAST 90 tablet 2  . nitroGLYCERIN (NITROSTAT) 0.4 MG SL tablet Place 1 tablet (0.4 mg total) under the tongue every 5 (five) minutes as needed. Please keep upcoming appt with Dr. Johnsie Cancel. Thank you 25 tablet 2  . rosuvastatin (CRESTOR) 40 MG tablet TAKE ONE TABLET BY MOUTH DAILY 90 tablet 0   No facility-administered medications prior to visit.      Allergies:   Gadolinium derivatives and Bee venom   Social History   Socioeconomic History  . Marital status: Married    Spouse name: Not on file  . Number of children: Not on file  . Years of education: Not on file  . Highest education level: Not on file  Occupational History  . Occupation: Product manager: [employer]    Comment: Patent attorney  Social Needs  . Financial resource strain: Not on file  . Food insecurity    Worry: Not on file  Inability: Not on file  . Transportation needs    Medical: Not on file    Non-medical: Not on file  Tobacco Use  . Smoking status: Never Smoker  . Smokeless tobacco: Never Used  Substance and Sexual Activity  . Alcohol use: Yes    Alcohol/week: 14.0 - 21.0 standard drinks    Types: 14 - 21 Glasses of wine per week    Comment: 2-3 gl wine daily  . Drug use: No  . Sexual activity: Not on file  Lifestyle  . Physical activity    Days per week: Not on file    Minutes per session: Not on file  . Stress: Not on file  Relationships  . Social Herbalist on phone: Not on file    Gets together: Not on file    Attends religious service: Not on file    Active member of club or organization: Not on file    Attends meetings of clubs or organizations: Not on file     Relationship status: Not on file  Other Topics Concern  . Not on file  Social History Narrative   Married. No kids. 1 stapdaughter 31- no grandkids. 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       Hobbies: enjoys walking (plantar fasciitis inhibits and also patellar tendonitis)     Family History:  The patient's family history includes Aneurysm in his father; Heart disease in his mother; Stroke in his mother.      ROS:   Please see the history of present illness.    ROS All other systems reviewed and are negative.   PHYSICAL EXAM:   VS:  BP (!) 148/96   Pulse 82   Ht 6' 1.5" (1.867 m)   Wt 268 lb (121.6 kg)   SpO2 98%   BMI 34.88 kg/m    Affect appropriate Overweight white male  HEENT: normal Neck supple with no adenopathy JVP normal no bruits no thyromegaly Lungs clear with no wheezing and good diaphragmatic motion Heart:  S1/S2 no murmur, no rub, gallop or click PMI normal post sternotomy  Abdomen: benighn, BS positve, no tenderness, no AAA no bruit.  No HSM or HJR right inguinal hernia  Distal pulses intact with no bruits No edema Neuro non-focal Skin warm and dry No muscular weakness    Wt Readings from Last 3 Encounters:  05/24/19 268 lb (121.6 kg)  04/24/19 274 lb 4 oz (124.4 kg)  02/08/19 266 lb (120.7 kg)      Studies/Labs Reviewed:   EKG:  SR rate 2 old IMI no acute changes  05/24/19 SR rate 65 old IMI no acute changes   Recent Labs: 07/20/2018: ALT 27 04/24/2019: BUN 17; Creatinine, Ser 0.87; Potassium 4.3; Sodium 134   Lipid Panel    Component Value Date/Time   CHOL 189 03/14/2018 1414   CHOL 181 08/25/2016 0848   TRIG 223.0 (H) 03/14/2018 1414   TRIG 269 (HH) 08/01/2006 1039   HDL 52.90 03/14/2018 1414   HDL 49 08/25/2016 0848   CHOLHDL 4 03/14/2018 1414   VLDL 44.6 (H) 03/14/2018 1414   LDLCALC 92 08/25/2016  0848   LDLDIRECT 81.0 07/20/2018 0848    Additional studies/ records that were reviewed today include:  Myoview 04/2015 Study Highlights    The left ventricular ejection fraction is moderately decreased (30-44%).  Nuclear  stress EF: 37%.  There was no ST segment deviation noted during stress.  Defect 1: There is a large defect of severe severity present in the basal inferior, basal inferolateral, mid inferior, mid inferolateral, apical inferior and apical lateral location.  Findings consistent with prior myocardial infarction.  This is an intermediate risk study.      Cardiac MR 05/2015 IMPRESSION: 1) Moderate LV dilatation with severe inferior wall hypokinesis consistent with previous MI. EF 42% 2) Biatrial enlargement 3) Normal RV 4) No significant ischemic MR 5) Significant Gadolinium reaction. Patient should not receive again see Rx noted above   ASSESSMENT & PLAN:   CAD s/p CABG x3V (1997): Non ischemic myovue 2016 continue medical Rx Ok for surgery as he is active with no angina encouraged him to take his coreg bid to put his BP/HR in better range ASA 81 mg   Morbid Obesity: Discussed low carb diet and exercise   HLD: continue statin.   HTN: Well controlled.  Continue current medications and low sodium Dash type diet.  Some white coat component Not taking coreg bid discussed  Gout: continue indocin and allopurinol try not to escalate diuretic Rx  Preoperative :  Clear to have inguinal surgery with Dr Rosendo Gros. He is concerned about COVID and does not want to stay as inpatient Clear to have surgery will let us know when scheduled    Jenkins Rouge

## 2019-05-24 ENCOUNTER — Ambulatory Visit: Payer: Medicare HMO | Admitting: Cardiovascular Disease

## 2019-05-24 ENCOUNTER — Encounter: Payer: Self-pay | Admitting: Cardiovascular Disease

## 2019-05-24 ENCOUNTER — Other Ambulatory Visit: Payer: Self-pay

## 2019-05-24 VITALS — BP 148/96 | HR 82 | Ht 73.5 in | Wt 268.0 lb

## 2019-05-24 DIAGNOSIS — Z01810 Encounter for preprocedural cardiovascular examination: Secondary | ICD-10-CM | POA: Diagnosis not present

## 2019-05-24 DIAGNOSIS — I25810 Atherosclerosis of coronary artery bypass graft(s) without angina pectoris: Secondary | ICD-10-CM

## 2019-05-24 MED ORDER — CARVEDILOL 3.125 MG PO TABS: 3.1250 mg | ORAL_TABLET | Freq: Two times a day (BID) | ORAL | Status: DC

## 2019-05-24 MED ORDER — CARVEDILOL 6.25 MG PO TABS: 6.2500 mg | ORAL_TABLET | Freq: Two times a day (BID) | ORAL | 3 refills | Status: DC

## 2019-05-24 NOTE — Patient Instructions (Addendum)
Medication Instructions:  Your physician has recommended you make the following change in your medication:  1-INCREASE Coreg 3.125 mg by mouth twice daily with food.  *If you need a refill on your cardiac medications before your next appointment, please call your pharmacy*  Lab Work:  If you have labs (blood work) drawn today and your tests are completely normal, you will receive your results only by: Marland Kitchen MyChart Message (if you have MyChart) OR . A paper copy in the mail If you have any lab test that is abnormal or we need to change your treatment, we will call you to review the results.  Testing/Procedures: None ordered today.  Follow-Up: At [facility], you and your health needs are our priority.  As part of our continuing mission to provide you with exceptional heart care, we have created designated Provider Care Teams.  These Care Teams include your primary Cardiologist (physician) and Advanced Practice Providers (APPs -  Physician Assistants and Nurse Practitioners) who all work together to provide you with the care you need, when you need it.  Your next appointment:   6 months  The format for your next appointment:   In Person  Provider:   You may see Jenkins Rouge, MD or one of the following Advanced Practice Providers on your designated Care Team:    Truitt Merle, NP  Cecilie Kicks, NP  Kathyrn Drown, NP

## 2019-06-19 ENCOUNTER — Ambulatory Visit: Payer: Medicare HMO

## 2019-06-19 ENCOUNTER — Encounter: Payer: Medicare HMO | Admitting: Family Medicine

## 2019-06-20 ENCOUNTER — Ambulatory Visit: Payer: Medicare HMO | Admitting: Cardiovascular Disease

## 2019-07-24 ENCOUNTER — Other Ambulatory Visit: Payer: Self-pay | Admitting: Family Medicine

## 2019-07-24 ENCOUNTER — Other Ambulatory Visit: Payer: Self-pay | Admitting: Cardiovascular Disease

## 2019-08-14 DIAGNOSIS — R69 Illness, unspecified: Secondary | ICD-10-CM | POA: Diagnosis not present

## 2019-09-06 ENCOUNTER — Encounter: Payer: Self-pay | Admitting: Family Medicine

## 2019-10-22 ENCOUNTER — Other Ambulatory Visit: Payer: Self-pay | Admitting: Family Medicine

## 2019-11-01 ENCOUNTER — Encounter: Payer: Self-pay | Admitting: Family Medicine

## 2019-11-08 DIAGNOSIS — R69 Illness, unspecified: Secondary | ICD-10-CM | POA: Diagnosis not present

## 2019-12-02 ENCOUNTER — Other Ambulatory Visit: Payer: Self-pay | Admitting: Family Medicine

## 2019-12-04 ENCOUNTER — Ambulatory Visit (INDEPENDENT_AMBULATORY_CARE_PROVIDER_SITE_OTHER): Payer: Medicare HMO | Admitting: Family Medicine

## 2019-12-04 ENCOUNTER — Other Ambulatory Visit: Payer: Self-pay

## 2019-12-04 ENCOUNTER — Encounter: Payer: Self-pay | Admitting: Family Medicine

## 2019-12-04 ENCOUNTER — Ambulatory Visit (INDEPENDENT_AMBULATORY_CARE_PROVIDER_SITE_OTHER): Payer: Medicare HMO

## 2019-12-04 VITALS — BP 146/84 | HR 76 | Temp 98.6°F | Ht 74.0 in | Wt 270.0 lb

## 2019-12-04 DIAGNOSIS — E785 Hyperlipidemia, unspecified: Secondary | ICD-10-CM

## 2019-12-04 DIAGNOSIS — K760 Fatty (change of) liver, not elsewhere classified: Secondary | ICD-10-CM | POA: Diagnosis not present

## 2019-12-04 DIAGNOSIS — M1A00X0 Idiopathic chronic gout, unspecified site, without tophus (tophi): Secondary | ICD-10-CM | POA: Diagnosis not present

## 2019-12-04 DIAGNOSIS — Z125 Encounter for screening for malignant neoplasm of prostate: Secondary | ICD-10-CM

## 2019-12-04 DIAGNOSIS — R739 Hyperglycemia, unspecified: Secondary | ICD-10-CM | POA: Diagnosis not present

## 2019-12-04 DIAGNOSIS — I10 Essential (primary) hypertension: Secondary | ICD-10-CM | POA: Diagnosis not present

## 2019-12-04 DIAGNOSIS — Z0000 Encounter for general adult medical examination without abnormal findings: Secondary | ICD-10-CM | POA: Diagnosis not present

## 2019-12-04 DIAGNOSIS — I2510 Atherosclerotic heart disease of native coronary artery without angina pectoris: Secondary | ICD-10-CM | POA: Diagnosis not present

## 2019-12-04 DIAGNOSIS — E118 Type 2 diabetes mellitus with unspecified complications: Secondary | ICD-10-CM | POA: Insufficient documentation

## 2019-12-04 LAB — COMPREHENSIVE METABOLIC PANEL
ALT: 30 U/L (ref 0–53)
AST: 27 U/L (ref 0–37)
Albumin: 4.5 g/dL (ref 3.5–5.2)
Alkaline Phosphatase: 58 U/L (ref 39–117)
BUN: 19 mg/dL (ref 6–23)
CO2: 31 mEq/L (ref 19–32)
Calcium: 9.6 mg/dL (ref 8.4–10.5)
Chloride: 97 mEq/L (ref 96–112)
Creatinine, Ser: 0.97 mg/dL (ref 0.40–1.50)
GFR: 76.82 mL/min (ref >60.00)
Glucose, Bld: 116 mg/dL — ABNORMAL HIGH (ref 70–99)
Potassium: 4.1 mEq/L (ref 3.5–5.1)
Sodium: 135 mEq/L (ref 135–145)
Total Bilirubin: 1.1 mg/dL (ref 0.2–1.2)
Total Protein: 7.5 g/dL (ref 6.0–8.3)

## 2019-12-04 LAB — LIPID PANEL
Cholesterol: 180 mg/dL (ref 0–200)
HDL: 54.3 mg/dL (ref >39.00)
LDL Cholesterol: 104 mg/dL — ABNORMAL HIGH (ref 0–99)
NonHDL: 125.92
Total CHOL/HDL Ratio: 3
Triglycerides: 109 mg/dL (ref 0.0–149.0)
VLDL: 21.8 mg/dL (ref 0.0–40.0)

## 2019-12-04 LAB — HEMOGLOBIN A1C: Hgb A1c MFr Bld: 6.3 % (ref 4.6–6.5)

## 2019-12-04 LAB — CBC WITH DIFFERENTIAL/PLATELET
Basophils Absolute: 0 10*3/uL (ref 0.0–0.1)
Basophils Relative: 1 % (ref 0.0–3.0)
Eosinophils Absolute: 0.1 10*3/uL (ref 0.0–0.7)
Eosinophils Relative: 1.3 % (ref 0.0–5.0)
HCT: 37.8 % — ABNORMAL LOW (ref 39.0–52.0)
Hemoglobin: 12.9 g/dL — ABNORMAL LOW (ref 13.0–17.0)
Lymphocytes Relative: 42.2 % (ref 12.0–46.0)
Lymphs Abs: 1.8 10*3/uL (ref 0.7–4.0)
MCHC: 34 g/dL (ref 30.0–36.0)
MCV: 97.1 fl (ref 78.0–100.0)
Monocytes Absolute: 0.6 10*3/uL (ref 0.1–1.0)
Monocytes Relative: 13.4 % — ABNORMAL HIGH (ref 3.0–12.0)
Neutro Abs: 1.8 10*3/uL (ref 1.4–7.7)
Neutrophils Relative %: 42.1 % — ABNORMAL LOW (ref 43.0–77.0)
Platelets: 186 10*3/uL (ref 150.0–400.0)
RBC: 3.89 Mil/uL — ABNORMAL LOW (ref 4.22–5.81)
RDW: 15.2 % (ref 11.5–15.5)
WBC: 4.4 10*3/uL (ref 4.0–10.5)

## 2019-12-04 LAB — PSA: PSA: 0.57 ng/mL (ref 0.10–4.00)

## 2019-12-04 MED ORDER — INDOMETHACIN 25 MG PO CAPS: ORAL_CAPSULE | ORAL | 3 refills | Status: DC

## 2019-12-04 NOTE — Patient Instructions (Addendum)
Health Maintenance Due  Topic Date Due  . COLONOSCOPY - cancelled due to covid will reschedule after hernia surgery 11/15/2018   Please stop by lab before you go If you have mychart- we will send your results within 3 business days of [place] receiving them.  If you do not have mychart- we will call you about results within 5 business days of [place] receiving them.   I like your goal of continuing to work toward 250lbs and great job on the walking.  Recommend limiting alcohol to 2 alcohol beverages per day.
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
## 2019-12-04 NOTE — Patient Instructions (Signed)
[name] , Thank you for taking time to come for your Medicare Wellness Visit. I appreciate your ongoing commitment to your health goals. Please review the following plan we discussed and let me know if I can assist you in the future.   Screening recommendations/referrals: Colorectal Screening: recommended; please follow through with scheduling   Vision and Dental Exams: Recommended annual ophthalmology exams for early detection of glaucoma and other disorders of the eye Recommended annual dental exams for proper oral hygiene  Vaccinations: Influenza vaccine: recommended each fall  Pneumococcal vaccine: up to date; last 09/27/16 Tdap vaccine: up to date; last 02/26/13  Shingles vaccine: You may receive this vaccine at your local pharmacy. (see handout)  Covid vaccine: Completed   Advanced directives: Please bring a copy of your POA (Power of Attorney) and/or Living Will to your next appointment.  Goals: Recommend to drink at least 6-8 8oz glasses of water per day and consume a balanced diet rich in fresh fruits and vegetables.   Next appointment: Please schedule your Annual Wellness Visit with your Nurse Health Advisor in one year.  Preventive Care 69 Years and Older, Male Preventive care refers to lifestyle choices and visits with your health care provider that can promote health and wellness. What does preventive care include?  A yearly physical exam. This is also called an annual well check.  Dental exams once or twice a year.  Routine eye exams. Ask your health care provider how often you should have your eyes checked.  Personal lifestyle choices, including:  Daily care of your teeth and gums.  Regular physical activity.  Eating a healthy diet.  Avoiding tobacco and drug use.  Limiting alcohol use.  Practicing safe sex.  Taking low doses of aspirin every day if recommended by your health care provider..  Taking vitamin and mineral supplements as recommended by  your health care provider. What happens during an annual well check? The services and screenings done by your health care provider during your annual well check will depend on your age, overall health, lifestyle risk factors, and family history of disease. Counseling  Your health care provider may ask you questions about your:  Alcohol use.  Tobacco use.  Drug use.  Emotional well-being.  Home and relationship well-being.  Sexual activity.  Eating habits.  History of falls.  Memory and ability to understand (cognition).  Work and work Statistician. Screening  You may have the following tests or measurements:  Height, weight, and BMI.  Blood pressure.  Lipid and cholesterol levels. These may be checked every 5 years, or more frequently if you are over 29 years old.  Skin check.  Lung cancer screening. You may have this screening every year starting at age 48 if you have a 30-pack-year history of smoking and currently smoke or have quit within the past 15 years.  Fecal occult blood test (FOBT) of the stool. You may have this test every year starting at age 8.  Flexible sigmoidoscopy or colonoscopy. You may have a sigmoidoscopy every 5 years or a colonoscopy every 10 years starting at age 52.  Prostate cancer screening. Recommendations will vary depending on your family history and other risks.  Hepatitis C blood test.  Hepatitis B blood test.  Sexually transmitted disease (STD) testing.  Diabetes screening. This is done by checking your blood sugar (glucose) after you have not eaten for a while (fasting). You may have this done every 1-3 years.  Abdominal aortic aneurysm (AAA) screening. You may need  this if you are a current or former smoker.  Osteoporosis. You may be screened starting at age 26 if you are at high risk. Talk with your health care provider about your test results, treatment options, and if necessary, the need for more tests. Vaccines  Your  health care provider may recommend certain vaccines, such as:  Influenza vaccine. This is recommended every year.  Tetanus, diphtheria, and acellular pertussis (Tdap, Td) vaccine. You may need a Td booster every 10 years.  Zoster vaccine. You may need this after age 31.  Pneumococcal 13-valent conjugate (PCV13) vaccine. One dose is recommended after age 48.  Pneumococcal polysaccharide (PPSV23) vaccine. One dose is recommended after age 65. Talk to your health care provider about which screenings and vaccines you need and how often you need them. This information is not intended to replace advice given to you by your health care provider. Make sure you discuss any questions you have with your health care provider. Document Released: 08/15/2015 Document Revised: 04/07/2016 Document Reviewed: 05/20/2015 Elsevier Interactive Patient Education  2017 [place] Prevention in the Home Falls can cause injuries. They can happen to people of all ages. There are many things you can do to make your home safe and to help prevent falls. What can I do on the outside of my home?  Regularly fix the edges of walkways and driveways and fix any cracks.  Remove anything that might make you trip as you walk through a door, such as a raised step or threshold.  Trim any bushes or trees on the path to your home.  Use bright outdoor lighting.  Clear any walking paths of anything that might make someone trip, such as rocks or tools.  Regularly check to see if handrails are loose or broken. Make sure that both sides of any steps have handrails.  Any raised decks and porches should have guardrails on the edges.  Have any leaves, snow, or ice cleared regularly.  Use sand or salt on walking paths during winter.  Clean up any spills in your garage right away. This includes oil or grease spills. What can I do in the bathroom?  Use night lights.  Install grab bars by the toilet and in the tub and  shower. Do not use towel bars as grab bars.  Use non-skid mats or decals in the tub or shower.  If you need to sit down in the shower, use a plastic, non-slip stool.  Keep the floor dry. Clean up any water that spills on the floor as soon as it happens.  Remove soap buildup in the tub or shower regularly.  Attach bath mats securely with double-sided non-slip rug tape.  Do not have throw rugs and other things on the floor that can make you trip. What can I do in the bedroom?  Use night lights.  Make sure that you have a light by your bed that is easy to reach.  Do not use any sheets or blankets that are too big for your bed. They should not hang down onto the floor.  Have a firm chair that has side arms. You can use this for support while you get dressed.  Do not have throw rugs and other things on the floor that can make you trip. What can I do in the kitchen?  Clean up any spills right away.  Avoid walking on wet floors.  Keep items that you use a lot in easy-to-reach places.  If you  need to reach something above you, use a strong step stool that has a grab bar.  Keep electrical cords out of the way.  Do not use floor polish or wax that makes floors slippery. If you must use wax, use non-skid floor wax.  Do not have throw rugs and other things on the floor that can make you trip. What can I do with my stairs?  Do not leave any items on the stairs.  Make sure that there are handrails on both sides of the stairs and use them. Fix handrails that are broken or loose. Make sure that handrails are as long as the stairways.  Check any carpeting to make sure that it is firmly attached to the stairs. Fix any carpet that is loose or worn.  Avoid having throw rugs at the top or bottom of the stairs. If you do have throw rugs, attach them to the floor with carpet tape.  Make sure that you have a light switch at the top of the stairs and the bottom of the stairs. If you do not  have them, ask someone to add them for you. What else can I do to help prevent falls?  Wear shoes that:  Do not have high heels.  Have rubber bottoms.  Are comfortable and fit you well.  Are closed at the toe. Do not wear sandals.  If you use a stepladder:  Make sure that it is fully opened. Do not climb a closed stepladder.  Make sure that both sides of the stepladder are locked into place.  Ask someone to hold it for you, if possible.  Clearly mark and make sure that you can see:  Any grab bars or handrails.  First and last steps.  Where the edge of each step is.  Use tools that help you move around (mobility aids) if they are needed. These include:  Canes.  Walkers.  Scooters.  Crutches.  Turn on the lights when you go into a dark area. Replace any light bulbs as soon as they burn out.  Set up your furniture so you have a clear path. Avoid moving your furniture around.  If any of your floors are uneven, fix them.  If there are any pets around you, be aware of where they are.  Review your medicines with your doctor. Some medicines can make you feel dizzy. This can increase your chance of falling. Ask your doctor what other things that you can do to help prevent falls. This information is not intended to replace advice given to you by your health care provider. Make sure you discuss any questions you have with your health care provider. Document Released: 05/15/2009 Document Revised: 12/25/2015 Document Reviewed: 08/23/2014 Elsevier Interactive Patient Education  2017 [employer].

## 2019-12-04 NOTE — Progress Notes (Deleted)
Shawn Anderson

## 2019-12-04 NOTE — Progress Notes (Signed)
Subjective:   Shawn Anderson is a 69 y.o. male who presents for an Initial Medicare Annual Wellness Visit.  Review of Systems   Cardiac Risk Factors include: advanced age (>91men, >38 women);male gender;hypertension   Objective:    Today's Vitals   12/04/19 1033  BP: (!) 146/84  Pulse: 76  Temp: 98.6 F (37 C)  TempSrc: Temporal  SpO2: 96%  Weight: 270 lb (122.5 kg)  Height: 6\' 2"  (1.88 m)   Body mass index is 34.67 kg/m.  Advanced Directives 06/12/2013 06/05/2013  Does Patient Have a Medical Advance Directive? Patient does not have advance directive;Patient would not like information Patient does not have advance directive;Patient would not like information  Pre-existing out of facility DNR order (yellow form or pink MOST form) Yellow form placed in chart (order not valid for inpatient use) -    Current Medications (verified) Outpatient Encounter Medications as of 12/04/2019  Medication Sig  . amoxicillin (AMOXIL) 500 MG capsule Take 500 mg by mouth 3 (three) times daily. Pt takes for dental appts  . aspirin EC 81 MG tablet Take 1 tablet (81 mg total) by mouth daily.  . carvedilol (COREG) 3.125 MG tablet Take 1 tablet (3.125 mg total) by mouth 2 (two) times daily with a meal.  . indomethacin (INDOCIN) 25 MG capsule indomethacin 25 mg capsule  TAKE TWO CAPSULES PER DAY AFTER MEALS AS NEEDED  . losartan-hydrochlorothiazide (HYZAAR) 100-25 MG tablet TAKE ONE TABLET BY MOUTH DAILY  . metFORMIN (GLUCOPHAGE) 500 MG tablet TAKE ONE TABLET BY MOUTH DAILY WITH BREAKFAST  . nitroGLYCERIN (NITROSTAT) 0.4 MG SL tablet Place 1 tablet (0.4 mg total) under the tongue every 5 (five) minutes as needed. Please keep upcoming appt with Dr. Johnsie Cancel. Thank you  . rosuvastatin (CRESTOR) 40 MG tablet TAKE ONE TABLET BY MOUTH DAILY   No facility-administered encounter medications on file as of 12/04/2019.    Allergies (verified) Gadolinium derivatives and Bee venom   History: Past Medical  History:  Diagnosis Date  . 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 Arthritis   . CAD (coronary artery disease)    seen by Dr. Johnsie Cancel  . Foot swelling   . Gout   . History of kidney stones   . Hyperlipidemia   . Hypertension   . Myocardial infarction ([place]) 1997  . Nocturia   . Obesity   . Patellar tendonitis   . Personal history of colonic polyp-adenoma  11/20/2013  . Plantar fasciitis    Past Surgical History:  Procedure Laterality Date  . CORONARY ARTERY BYPASS GRAFT  1997   3 VESSELS  . HEMICOLECTOMY Right 2007   low grade mucnous appendiceal neoplasm  . HERNIA REPAIR    . TONSILLECTOMY    . TOTAL HIP ARTHROPLASTY Right 06/12/2013   Procedure: RIGHT TOTAL HIP ARTHROPLASTY ANTERIOR APPROACH;  Surgeon: Mauri Pole, MD;  Location: [facility];  Service: Orthopedics;  Laterality: Right;   Family History  Problem Relation Age of Onset  . Stroke Mother   . Heart disease Mother   . Aneurysm Father   . Colon cancer Neg Hx   . Esophageal cancer Neg Hx   . Rectal cancer Neg Hx   . Stomach cancer Neg Hx    Social History   Socioeconomic History  . Marital status: Married    Spouse name: Not on file  . Number of children: Not on file  . Years of education: Not on file  .  Highest education level: Not on file  Occupational History  . Occupation: Product manager: [employer]    Comment: Patent attorney  Tobacco Use  . Smoking status: Never Smoker  . Smokeless tobacco: Never Used  Substance and Sexual Activity  . Alcohol use: Yes    Alcohol/week: 14.0 - 21.0 standard drinks    Types: 14 - 21 Glasses of wine per week    Comment: 2-3 gl wine daily  . Drug use: No  . Sexual activity: Not on file  Other Topics Concern  . Not on file  Social History Narrative   Married. No kids. 1 stapdaughter 31- no grandkids. 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       Hobbies: enjoys walking (plantar fasciitis inhibits and also patellar tendonitis)   Social Determinants of Health   Financial Resource Strain:   . Difficulty of Paying Living Expenses:   Food Insecurity:   . Worried About Charity fundraiser in the Last Year:   . Arboriculturist in the Last Year:   Transportation Needs:   . Film/video editor (Medical):   Marland Kitchen Lack of Transportation (Non-Medical):   Physical Activity:   . Days of Exercise per Week:   . Minutes of Exercise per Session:   Stress:   . Feeling of Stress :   Social Connections:   . Frequency of Communication with Friends and Family:   . Frequency of Social Gatherings with Friends and Family:   . Attends Religious Services:   . Active Member of Clubs or Organizations:   . Attends Archivist Meetings:   Marland Kitchen Marital Status:    Tobacco Counseling Counseling given: Not Answered   Clinical Intake:  Pre-visit preparation completed: Yes  Pain : No/denies pain  Diabetes: No  How often do you need to have someone help you when you read instructions, pamphlets, or other written materials from your doctor or pharmacy?: 1 - Never  Interpreter Needed?: No  Information entered by :: Denman George LPN  Activities of Daily Living In your present state of health, do you have any difficulty performing the following activities: 12/04/2019 12/04/2019  Hearing? - N  Vision? - N  Difficulty concentrating or making decisions? - N  Walking or climbing stairs? - N  Dressing or bathing? - N  Doing errands, shopping? - N  Conservation officer, nature and eating ? N N  Using the Toilet? N N  In the past six months, have you accidently leaked urine? N N  Do you have problems with loss of bowel control? N N  Managing your Medications? - N  Managing your Finances? N N  Housekeeping or managing your Housekeeping? - N  Some recent data might be hidden     Immunizations and Health  Maintenance Immunization History  Administered Date(s) Administered  . PFIZER SARS-COV-2 Vaccination 09/06/2019, 09/27/2019  . Pneumococcal Conjugate-13 11/20/2014  . Pneumococcal Polysaccharide-23 09/27/2016  . Pneumococcal-Unspecified 08/03/2015  . Tdap 01/29/2008, 02/26/2013  . Zoster 08/22/2013   Health Maintenance Due  Topic Date Due  . COLONOSCOPY  11/15/2018    Patient Care Team: Marin Olp, MD as PCP - General (Family Medicine) Josue Hector, MD as PCP - Cardiology (Cardiology)  Indicate any recent Medical Services you may have received from other than [facility] providers in the past year (  date may be approximate).    Assessment:   This is a routine wellness examination for Franz.  Hearing/Vision screen No exam data present  Dietary issues and exercise activities discussed: Current Exercise Habits: Home exercise routine, Type of exercise: walking, Time (Minutes): 60, Frequency (Times/Week): 5, Weekly Exercise (Minutes/Week): 300, Intensity: Moderate  Goals   None    Depression Screen PHQ 2/9 Scores 12/04/2019 03/14/2018 09/27/2016  PHQ - 2 Score 0 0 0  PHQ- 9 Score 0 - -    Fall Risk Fall Risk  12/04/2019 03/14/2018 09/27/2016  Falls in the past year? 1 No No  Number falls in past yr: 0 - -  Injury with Fall? 1 - -  Follow up Falls evaluation completed;Education provided;Falls prevention discussed - -    Is the patient's home free of loose throw rugs in walkways, pet beds, electrical cords, etc?   yes      Grab bars in the bathroom? yes      Handrails on the stairs?   yes      Adequate lighting?   yes  Timed Get Up and Go performed: completed and within normal timeframe; no gait abnormalities noted   Cognitive Function: no cognitive concerns at this time    6CIT Screen 12/04/2019  What Year? 0 points  What month? 0 points  What time? 0 points  Count back from 20 0 points  Months in reverse 0 points  Repeat phrase 0 points  Total Score 0    Screening  Tests Health Maintenance  Topic Date Due  . COLONOSCOPY  11/15/2018  . INFLUENZA VACCINE  03/02/2020  . TETANUS/TDAP  02/27/2023  . COVID-19 Vaccine  Completed  . Hepatitis C Screening  Completed  . PNA vac Low Risk Adult  Completed    Qualifies for Shingles Vaccine? Discussed and patient will check with pharmacy for coverage.  Patient education handout provided   Cancer Screenings: Lung: Low Dose CT Chest recommended if Age 23-80 years, 30 pack-year currently smoking OR have quit w/in 15years. Patient does not qualify. Colorectal: colonoscopy 11/14/13; patient plans to schedule repeat   Plan:  I have personally reviewed and addressed the Medicare Annual Wellness questionnaire and have noted the following in the patient's chart:  A. Medical and social history B. Use of alcohol, tobacco or illicit drugs  C. Current medications and supplements D. Functional ability and status E.  Nutritional status F.  Physical activity G. Advance directives H. List of other physicians I.  Hospitalizations, surgeries, and ER visits in previous 12 months J.  Eunice such as hearing and vision if needed, cognitive and depression L. Referrals, records requested, and appointments- none   In addition, I have reviewed and discussed with patient certain preventive protocols, quality metrics, and best practice recommendations. A written personalized care plan for preventive services as well as general preventive health recommendations were provided to patient.   Signed,  Denman George, LPN  Nurse Health Advisor   Nurse Notes: no additional

## 2020-02-05 NOTE — Progress Notes (Signed)
Cardiology Office Note    Date:  02/08/2020   ID:  ZYMIERE TROSTLE, DOB [birth year], MRN [id number]  PCP:  Marin Olp, MD  Cardiologist: Dr. Johnsie Cancel  CC: follow up   History of Present Illness:   69 y.o. CAD/CABG 1997 HTN and Gout. Last myovue 04/29/15 inferior scar no ischemia. Cardiac MRI 05/22/15 EF 42% so no AICD. Had reaction to gadolinium with hives and pruritis so should not have again. HTN Rx with coreg and ARB. Needs to be reminded to take ASA daily. Probable gouty knee arthritis better with allopurinol  Walking trails by [employer] a lot.   No angina despite vigorous activity   Needs right inguinal hernia surgery Taking his coreg bid now  Had COVID vaccine in February   Seeing surgeon this week He is very active walking the [place] and no angina ok to have general anesthesia And surgery. His BP is high most times he is checked Discussed starting calcium blocker He is not really compliant With his afternoon dose of coreg or ASA Discussed importance of this     Past Medical History:  Diagnosis Date  . 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 Arthritis   . CAD (coronary artery disease)    seen by Dr. Johnsie Cancel  . Foot swelling   . Gout   . History of kidney stones   . Hyperlipidemia   . Hypertension   . Myocardial infarction ([place]) 1997  . Nocturia   . Obesity   . Patellar tendonitis   . Personal history of colonic polyp-adenoma  11/20/2013  . Plantar fasciitis     Past Surgical History:  Procedure Laterality Date  . CORONARY ARTERY BYPASS GRAFT  1997   3 VESSELS  . HEMICOLECTOMY Right 2007   low grade mucnous appendiceal neoplasm  . HERNIA REPAIR    . TONSILLECTOMY    . TOTAL HIP ARTHROPLASTY Right 06/12/2013   Procedure: RIGHT TOTAL HIP ARTHROPLASTY ANTERIOR APPROACH;  Surgeon: Mauri Pole, MD;  Location: [facility];  Service: Orthopedics;  Laterality: Right;    Current Medications: Outpatient Medications Prior to Visit  Medication  Sig Dispense Refill  . amoxicillin (AMOXIL) 500 MG capsule Take 500 mg by mouth 3 (three) times daily. Pt takes for dental appts    . carvedilol (COREG) 3.125 MG tablet Take 1 tablet (3.125 mg total) by mouth 2 (two) times daily with a meal.    . indomethacin (INDOCIN) 25 MG capsule indomethacin 25 mg capsule  TAKE TWO CAPSULES PER DAY AFTER MEALS AS NEEDED 60 capsule 3  . losartan-hydrochlorothiazide (HYZAAR) 100-25 MG tablet TAKE ONE TABLET BY MOUTH DAILY 90 tablet 3  . metFORMIN (GLUCOPHAGE) 500 MG tablet TAKE ONE TABLET BY MOUTH DAILY WITH BREAKFAST 90 tablet 1  . nitroGLYCERIN (NITROSTAT) 0.4 MG SL tablet Place 1 tablet (0.4 mg total) under the tongue every 5 (five) minutes as needed. Please keep upcoming appt with Dr. Johnsie Cancel. Thank you 25 tablet 2  . rosuvastatin (CRESTOR) 40 MG tablet TAKE ONE TABLET BY MOUTH DAILY 90 tablet 3  . aspirin EC 81 MG tablet Take 1 tablet (81 mg total) by mouth daily. (Patient not taking: Reported on 02/08/2020) 90 tablet 3   No facility-administered medications prior to visit.     Allergies:   Gadolinium derivatives and Bee venom   Social History   Socioeconomic History  . Marital status: Married    Spouse name: Not on file  . Number of children:  Not on file  . Years of education: Not on file  . Highest education level: Not on file  Occupational History  . Occupation: Product manager: [employer]    Comment: Patent attorney  Tobacco Use  . Smoking status: Never Smoker  . Smokeless tobacco: Never Used  Substance and Sexual Activity  . Alcohol use: Yes    Alcohol/week: 14.0 - 21.0 standard drinks    Types: 14 - 21 Glasses of wine per week    Comment: 2-3 gl wine daily  . Drug use: No  . Sexual activity: Not on file  Other Topics Concern  . Not on file  Social History Narrative   Married. No kids. 1 stapdaughter 31- no grandkids. 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       Hobbies: enjoys walking (plantar fasciitis inhibits and also patellar tendonitis)   Social Determinants of Health   Financial Resource Strain:   . Difficulty of Paying Living Expenses:   Food Insecurity:   . Worried About Charity fundraiser in the Last Year:   . Arboriculturist in the Last Year:   Transportation Needs:   . Film/video editor (Medical):   Marland Kitchen Lack of Transportation (Non-Medical):   Physical Activity:   . Days of Exercise per Week:   . Minutes of Exercise per Session:   Stress:   . Feeling of Stress :   Social Connections:   . Frequency of Communication with Friends and Family:   . Frequency of Social Gatherings with Friends and Family:   . Attends Religious Services:   . Active Member of Clubs or Organizations:   . Attends Archivist Meetings:   Marland Kitchen Marital Status:      Family History:  The patient's family history includes Aneurysm in his father; Heart disease in his mother; Stroke in his mother.      ROS:   Please see the history of present illness.    ROS All other systems reviewed and are negative.   PHYSICAL EXAM:   VS:  BP (!) 142/90   Pulse 77   Ht 6\' 2"  (1.88 m)   Wt 272 lb (123.4 kg)   SpO2 98%   BMI 34.92 kg/m    Affect appropriate Overweight white male  HEENT: normal Neck supple with no adenopathy JVP normal no bruits no thyromegaly Lungs clear with no wheezing and good diaphragmatic motion Heart:  S1/S2 no murmur, no rub, gallop or click PMI normal post sternotomy  Abdomen: benighn, BS positve, no tenderness, no AAA no bruit.  No HSM or HJR right inguinal hernia  Distal pulses intact with no bruits No edema Neuro non-focal Skin warm and dry No muscular weakness    Wt Readings from Last 3 Encounters:  02/08/20 272 lb (123.4 kg)  12/04/19 270 lb (122.5 kg)  12/04/19 270 lb (122.5 kg)      Studies/Labs Reviewed:   EKG:  SR rate 69 old IMI  no acute changes  05/24/19 SR rate 9 old IMI no acute changes   Recent Labs: 12/04/2019: ALT 30; BUN 19; Creatinine, Ser 0.97; Hemoglobin 12.9; Platelets 186.0; Potassium 4.1; Sodium 135   Lipid Panel    Component Value Date/Time   CHOL 180 12/04/2019 1130   CHOL 181 08/25/2016 0848   TRIG 109.0 12/04/2019 1130  TRIG 269 (HH) 08/01/2006 1039   HDL 54.30 12/04/2019 1130   HDL 49 08/25/2016 0848   CHOLHDL 3 12/04/2019 1130   VLDL 21.8 12/04/2019 1130   LDLCALC 104 (H) 12/04/2019 1130   LDLCALC 92 08/25/2016 0848   LDLDIRECT 81.0 07/20/2018 0848    Additional studies/ records that were reviewed today include:  Myoview 04/2015 Study Highlights    The left ventricular ejection fraction is moderately decreased (30-44%).  Nuclear stress EF: 37%.  There was no ST segment deviation noted during stress.  Defect 1: There is a large defect of severe severity present in the basal inferior, basal inferolateral, mid inferior, mid inferolateral, apical inferior and apical lateral location.  Findings consistent with prior myocardial infarction.  This is an intermediate risk study.      Cardiac MR 05/2015 IMPRESSION: 1) Moderate LV dilatation with severe inferior wall hypokinesis consistent with previous MI. EF 42% 2) Biatrial enlargement 3) Normal RV 4) No significant ischemic MR 5) Significant Gadolinium reaction. Patient should not receive again see Rx noted above   ASSESSMENT & PLAN:   CAD s/p CABG x3V (1997): Non ischemic myovue 2016 continue medical Rx Ok for surgery as he is active with no angina encouraged him to take his coreg bid to put his BP/HR in better range ASA 81 mg   Morbid Obesity: Discussed low carb diet and exercise   HLD: continue statin.   HTN: Seems to be high all time start Norvasc 5 mg daily f/u with primary   Gout: continue indocin and allopurinol try not to escalate diuretic Rx  Preoperative :  Clear to have inguinal surgery with Dr Rosendo Gros. He  is concerned about COVID and does not want to stay as inpatient Clear to have surgery will let us know when scheduled    Jenkins Rouge

## 2020-02-08 ENCOUNTER — Other Ambulatory Visit: Payer: Self-pay

## 2020-02-08 ENCOUNTER — Ambulatory Visit: Payer: Medicare HMO | Admitting: Cardiovascular Disease

## 2020-02-08 ENCOUNTER — Encounter: Payer: Self-pay | Admitting: Cardiovascular Disease

## 2020-02-08 ENCOUNTER — Ambulatory Visit: Payer: Self-pay | Admitting: General Surgery

## 2020-02-08 ENCOUNTER — Telehealth: Payer: Self-pay | Admitting: Cardiovascular Disease

## 2020-02-08 VITALS — BP 142/90 | HR 77 | Ht 74.0 in | Wt 272.0 lb

## 2020-02-08 DIAGNOSIS — Z951 Presence of aortocoronary bypass graft: Secondary | ICD-10-CM | POA: Diagnosis not present

## 2020-02-08 DIAGNOSIS — Z01810 Encounter for preprocedural cardiovascular examination: Secondary | ICD-10-CM | POA: Diagnosis not present

## 2020-02-08 DIAGNOSIS — K4090 Unilateral inguinal hernia, without obstruction or gangrene, not specified as recurrent: Secondary | ICD-10-CM | POA: Diagnosis not present

## 2020-02-08 MED ORDER — ASPIRIN EC 81 MG PO TBEC: 81.0000 mg | DELAYED_RELEASE_TABLET | Freq: Every day | ORAL | 3 refills | Status: AC

## 2020-02-08 MED ORDER — NITROGLYCERIN 0.4 MG SL SUBL: 0.4000 mg | SUBLINGUAL_TABLET | SUBLINGUAL | 3 refills | Status: DC | PRN

## 2020-02-08 MED ORDER — AMLODIPINE BESYLATE 5 MG PO TABS: 5.0000 mg | ORAL_TABLET | Freq: Every day | ORAL | 3 refills | Status: DC

## 2020-02-08 NOTE — Telephone Encounter (Signed)
Called patient back. Patient stated he thought he had been on amlodipine at one time and it had caused neck pain, so a stress test was done. Informed patient after reviewing his medication list. Amlodipine was prescribed to him in 2014, but it was taking off his list by his PCP 3 months later. Informed patient that he was started on Avapro in 2016 and it was discontinued around the same time he had his stress test. Patient thinks he might have the two medications mixed up. Encouraged patient to give our office a call if he has any issues or concerns with the amlodipine when he starts taking it. Patient agreed to plan and will give our office a call if he has any other questions. Refilled patient's nirto while on the phone.

## 2020-02-08 NOTE — Telephone Encounter (Signed)
**Note De-Identified Shawn Anderson Obfuscation** NTG refill escribed to [facility] to fill as requested by pt.

## 2020-02-08 NOTE — Telephone Encounter (Signed)
New Message  Patient is calling in to speak with nurse. Has some follow up questions from appointment. Pleas give patient a call back.

## 2020-02-08 NOTE — H&P (Signed)
History of Present Illness Ralene Ok MD; 02/08/2020 4:00 PM) The patient is a 69 year old male who presents with an inguinal hernia. Patient is a 69 year old male who follows back up for a right inguinal hernia. Patient has a history of diabetes, hyperlipidemia, hypertension, previous CABG. Patient comes in today with continued right inguinal hernia as well as pain. Patient continues to be active hiker. He does state that with activities and lifting notices that the hernia bulges out more.  He's had no signs or symptoms of incarceration or granulation.  ----- Referred by: Dr. Morene Rankins Chief Complaint: Right inguinal hernia  Patient is a 69 year old male with a history of diabetes, hyperlipidemia, previous CABG, who comes in with a right inguinal hernia. Patient states this is been there for approximately one to 2 months. He states that it is small. It does not cause him any discomfort. Patient does do some lifting as well as walking. He is approximately 800 miles this year. Patient had no signs or symptoms of any hernia strangulation or incarceration. Patient does state that the area does reduce when he lays down.  Patient's had a previous right paramedian incision for a appendectomy.    Allergies Darden Palmer, [place]; 02/08/2020 3:44 PM) Contrast [employer] Ready-Box *MEDICAL DEVICES AND SUPPLIES*   Medication History Darden Palmer, [place]; 02/08/2020 3:45 PM) Carvedilol (3.125MG  Tablet, Oral) Active. Losartan Potassium-HCTZ (100-25MG  Tablet, Oral) Active. metFORMIN HCl (500MG  Tablet, Oral) Active. Rosuvastatin Calcium (40MG  Tablet, Oral) Active. Medications Reconciled Nitroglycerin (0.4MG  Tab Sublingual, Sublingual) Active.    Review of Systems Ralene Ok, MD; 02/08/2020 4:1 PM) General Not Present- Appetite Loss, Chills, Fatigue, Fever, Night Sweats, Weight Gain and Weight Loss. Skin Not Present- Change in Wart/Mole, Dryness, Hives, Jaundice, New Lesions,  Non-Healing Wounds, Rash and Ulcer. HEENT Present- Seasonal Allergies and Wears glasses/contact lenses. Not Present- Earache, Hearing Loss, Hoarseness, Nose Bleed, Oral Ulcers, Ringing in the Ears, Sinus Pain, Sore Throat, Visual Disturbances and Yellow Eyes. Respiratory Present- Snoring. Not Present- Bloody sputum, Chronic Cough, Difficulty Breathing and Wheezing. Breast Not Present- Breast Mass, Breast Pain, Nipple Discharge and Skin Changes. Cardiovascular Not Present- Chest Pain, Difficulty Breathing Lying Down, Leg Cramps, Palpitations, Rapid Heart Rate, Shortness of Breath and Swelling of Extremities. Gastrointestinal Present- Abdominal Pain. Not Present- Bloating, Bloody Stool, Change in Bowel Habits, Chronic diarrhea, Constipation, Difficulty Swallowing, Excessive gas, Gets full quickly at meals, Hemorrhoids, Indigestion, Nausea, Rectal Pain and Vomiting. Male Genitourinary Not Present- Blood in Urine, Change in Urinary Stream, Frequency, Impotence, Nocturia, Painful Urination, Urgency and Urine Leakage. Musculoskeletal Not Present- Back Pain, Joint Pain, Joint Stiffness, Muscle Pain, Muscle Weakness and Swelling of Extremities. Neurological Not Present- Decreased Memory, Fainting, Headaches, Numbness, Seizures, Tingling, Tremor, Trouble walking and Weakness. Psychiatric Not Present- Anxiety, Bipolar, Change in Sleep Pattern, Depression, Fearful and Frequent crying. Endocrine Not Present- Cold Intolerance, Excessive Hunger, Hair Changes, Heat Intolerance and New Diabetes. Hematology Not Present- Blood Thinners, Easy Bruising, Excessive bleeding, Gland problems, HIV and Persistent Infections. All other systems negative  Vitals Darden Palmer RMA; 02/08/2020 3:46 PM) 02/08/2020 3:45 PM Weight: 269.5 lb Height: 74in Body Surface Area: 2.47 m Body Mass Index: 34.6 kg/m  Temp.: 98.69F  Pulse: 81 (Regular)  BP: 140/82(Sitting, Left Arm, Standard)       Physical Exam Ralene Ok, MD; 02/08/2020 4:1 PM) The physical exam findings are as follows: Note: Constitutional: No acute distress, conversant, appears stated age  Eyes: Anicteric sclerae, moist conjunctiva, no lid lag  Neck: No thyromegaly, trachea midline, no cervical  lymphadenopathy  Lungs: Clear to auscultation biilaterally, normal respiratory effot  Cardiovascular: regular rate & rhythm, no murmurs, no peripheal edema, pedal pulses 2+  GI: Soft, no masses or hepatosplenomegaly, non-tender to palpation, right paramedian incision  MSK: Normal gait, no clubbing cyanosis, edema  Skin: No rashes, palpation reveals normal skin turgor  Psychiatric: Appropriate judgment and insight, oriented to person, place, and time  Abdomen Inspection Hernias - Right - Inguinal hernia - Reducible - Right.    Assessment & Plan Ralene Ok MD; 02/08/2020 4:01 PM) RIGHT INGUINAL HERNIA (K40.90) Impression: 69 year old male with a history of diabetes, hypertension, previous CABG, and a right inguinal hernia.  Patient has a history of an right paramedian incision for an appendectomy.  1. The patient will like to proceed to the operating room for open right inguinal hernia repair with mesh.  2. I discussed with the patient the signs and symptoms of incarceration and strangulation and the need to proceed to the ER should they occur.  3. I discussed with the patient the risks and benefits of the procedure to include but not limited to: Infection, bleeding, damage to surrounding structures, possible need for further surgery, possible nerve pain, and possible recurrence. The patient was understanding and wishes to proceed.

## 2020-02-08 NOTE — Telephone Encounter (Signed)
New message   *STAT* If patient is at the pharmacy, call can be transferred to refill team.   1. Which medications need to be refilled? (please list name of each medication and dose if known) nitroGLYCERIN (NITROSTAT) 0.4 MG SL tablet  2. Which pharmacy/location (including street and city if local pharmacy) is medication to be sent to? [facility] [address], [place]  3. Do they need a 30 day or 90 day supply? 30 day

## 2020-02-08 NOTE — Patient Instructions (Addendum)
Medication Instructions:  Your physician has recommended you make the following change in your medication:  1-START Norvasc 5 mg by mouth daily  *If you need a refill on your cardiac medications before your next appointment, please call your pharmacy*  Lab Work: If you have labs (blood work) drawn today and your tests are completely normal, you will receive your results only by: Marland Kitchen MyChart Message (if you have MyChart) OR . A paper copy in the mail If you have any lab test that is abnormal or we need to change your treatment, we will call you to review the results.  Testing/Procedures: None ordered today.  Follow-Up: At [facility], you and your health needs are our priority.  As part of our continuing mission to provide you with exceptional heart care, we have created designated Provider Care Teams.  These Care Teams include your primary Cardiologist (physician) and Advanced Practice Providers (APPs -  Physician Assistants and Nurse Practitioners) who all work together to provide you with the care you need, when you need it.  We recommend signing up for the patient portal called "MyChart".  Sign up information is provided on this After Visit Summary.  MyChart is used to connect with patients for Virtual Visits (Telemedicine).  Patients are able to view lab/test results, encounter notes, upcoming appointments, etc.  Non-urgent messages can be sent to your provider as well.   To learn more about what you can do with MyChart, go to [email or web address].    Your next appointment:   6 month(s)  The format for your next appointment:   In Person  Provider:   You may see Jenkins Rouge, MD or one of the following Advanced Practice Providers on your designated Care Team:    Truitt Merle, NP  Cecilie Kicks, NP  Kathyrn Drown, NP

## 2020-02-08 NOTE — Addendum Note (Signed)
**Note De-Identified Estera Ozier Obfuscation** Addended by: Dennie Fetters on: 02/08/2020 12:01 PM   Modules accepted: Orders

## 2020-03-05 NOTE — Pre-Procedure Instructions (Addendum)
PINCHOS TOPEL  03/05/2020      [facility] [address], [place] [address] Phone: [phone] Fax: [phone]    Your procedure is scheduled on Aug.16  Report to [facility] entrance A at 7:00 A.M.  Call this number if you have problems the morning of surgery:  [phone]   Remember:  Do not eat  after midnight the night before surgery.   You may drink clear liquids until 6:00 a.m.  Clear liquids allowed are:                    Water, Juice (non-citric and without pulp - diabetics please choose diet or no sugar options), Carbonated beverages - (diabetics please choose diet or no sugar options), Clear Tea, Black Coffee only (no creamer, milk or cream including half and half), Plain Jell-O only (diabetics please choose diet or no sugar options), Gatorade (diabetics please choose diet or no sugar options) and Plain Popsicles only                  Please complete your PRE-SURGERY G2 gatorade  that was provided to you by 6:00 the morning of surgery.  Please, if able, drink it in one setting. DO NOT SIP.    Take these medicines the morning of surgery with A SIP OF WATER :              Amlodipine (norvasc)             Carvedilol (coreg)             Rosuvastatin (crestor)                 If needed: nitroglycerine                7 days prior to surgery STOP taking any Aspirin (unless otherwise instructed by your surgeon), Aleve, Naproxen, Ibuprofen, Motrin, Advil, Goody's, BC's, all herbal medications, fish oil, and all vitamins.         Follow your surgeon's instructions on when to stop Aspirin.  If no instructions were given by your surgeon then you will need to call the office to get those instructions.                          How to Manage Your Diabetes Before and After Surgery  Why is it important to control my blood sugar before and after surgery? . Improving blood sugar levels before and after  surgery helps healing and can limit problems. . A way of improving blood sugar control is eating a healthy diet by: o  Eating less sugar and carbohydrates o  Increasing activity/exercise o  Talking with your doctor about reaching your blood sugar goals . High blood sugars (greater than 180 mg/dL) can raise your risk of infections and slow your recovery, so you will need to focus on controlling your diabetes during the weeks before surgery. . Make sure that the doctor who takes care of your diabetes knows about your planned surgery including the date and location.  How do I manage my blood sugar before surgery? . Check your blood sugar at least 4 times a day, starting 2 days before surgery, to make sure that the level is not too high or low. o Check your blood sugar the morning of your surgery when you wake up and every 2 hours until  you get to the Short Stay unit. . If your blood sugar is less than 70 mg/dL, you will need to treat for low blood sugar: o Do not take insulin. o Treat a low blood sugar (less than 70 mg/dL) with  cup of clear juice (cranberry or apple), 4 glucose tablets, OR glucose gel. Recheck blood sugar in 15 minutes after treatment (to make sure it is greater than 70 mg/dL). If your blood sugar is not greater than 70 mg/dL on recheck, call [phone] o  for further instructions. . Report your blood sugar to the short stay nurse when you get to Short Stay.  . If you are admitted to the hospital after surgery: o Your blood sugar will be checked by the staff and you will probably be given insulin after surgery (instead of oral diabetes medicines) to make sure you have good blood sugar levels. o The goal for blood sugar control after surgery is 80-180 mg/dL.       WHAT DO I DO ABOUT MY DIABETES MEDICATION?   Marland Kitchen Do not take oral diabetes medicines (pills) the morning of surgery.  (metformin/glucophage)      Do not wear jewelry.  Do not wear lotions, powders, or  perfumes, or deodorant.  Do not shave 48 hours prior to surgery.  Men may shave face and neck.  Do not bring valuables to the hospital.  [facility] is not responsible for any belongings or valuables.  Contacts, dentures or bridgework may not be worn into surgery.  Leave your suitcase in the car.  After surgery it may be brought to your room.  For patients admitted to the hospital, discharge time will be determined by your treatment team.  Patients discharged the day of surgery will not be allowed to drive home.    Special instructions:   [facility]- Preparing For Surgery  Before surgery, you can play an important role. Because skin is not sterile, your skin needs to be as free of germs as possible. You can reduce the number of germs on your skin by washing with CHG (chlorahexidine gluconate) Soap before surgery.  CHG is an antiseptic cleaner which kills germs and bonds with the skin to continue killing germs even after washing.    Oral Hygiene is also important to reduce your risk of infection.  Remember - BRUSH YOUR TEETH THE MORNING OF SURGERY WITH YOUR REGULAR TOOTHPASTE  Please do not use if you have an allergy to CHG or antibacterial soaps. If your skin becomes reddened/irritated stop using the CHG.  Do not shave (including legs and underarms) for at least 48 hours prior to first CHG shower. It is OK to shave your face.  Please follow these instructions carefully.   1. Shower the NIGHT BEFORE SURGERY and the MORNING OF SURGERY with CHG.   2. If you chose to wash your hair, wash your hair first as usual with your normal shampoo.  3. After you shampoo, rinse your hair and body thoroughly to remove the shampoo.  4. Use CHG as you would any other liquid soap. You can apply CHG directly to the skin and wash gently with a scrungie or a clean washcloth.   5. Apply the CHG Soap to your body ONLY FROM THE NECK DOWN.  Do not use on open wounds or open sores. Avoid contact with your eyes,  ears, mouth and genitals (private parts). Wash Face and genitals (private parts)  with your normal soap.  6. Wash thoroughly, paying special  attention to the area where your surgery will be performed.  7. Thoroughly rinse your body with warm water from the neck down.  8. DO NOT shower/wash with your normal soap after using and rinsing off the CHG Soap.  9. Pat yourself dry with a CLEAN TOWEL.  10. Wear CLEAN PAJAMAS to bed the night before surgery, wear comfortable clothes the morning of surgery  11. Place CLEAN SHEETS on your bed the night of your first shower and DO NOT SLEEP WITH PETS.    Day of Surgery:  Do not apply any deodorants/lotions.  Please wear clean clothes to the hospital/surgery center.   Remember to brush your teeth WITH YOUR REGULAR TOOTHPASTE.    Please read over the following fact sheets that you were given.

## 2020-03-06 ENCOUNTER — Encounter (HOSPITAL_COMMUNITY): Payer: Self-pay

## 2020-03-06 ENCOUNTER — Encounter (HOSPITAL_COMMUNITY)
Admission: RE | Admit: 2020-03-06 | Discharge: 2020-03-06 | Disposition: A | Discharge status: Home / Self Care | Payer: Medicare HMO | Source: Ambulatory Visit | Attending: General Surgery | Admitting: General Surgery

## 2020-03-06 ENCOUNTER — Other Ambulatory Visit: Payer: Self-pay

## 2020-03-06 DIAGNOSIS — E119 Type 2 diabetes mellitus without complications: Secondary | ICD-10-CM | POA: Insufficient documentation

## 2020-03-06 DIAGNOSIS — K4090 Unilateral inguinal hernia, without obstruction or gangrene, not specified as recurrent: Secondary | ICD-10-CM | POA: Insufficient documentation

## 2020-03-06 DIAGNOSIS — Z01812 Encounter for preprocedural laboratory examination: Secondary | ICD-10-CM | POA: Diagnosis not present

## 2020-03-06 DIAGNOSIS — I10 Essential (primary) hypertension: Secondary | ICD-10-CM | POA: Insufficient documentation

## 2020-03-06 DIAGNOSIS — Z951 Presence of aortocoronary bypass graft: Secondary | ICD-10-CM | POA: Diagnosis not present

## 2020-03-06 DIAGNOSIS — E785 Hyperlipidemia, unspecified: Secondary | ICD-10-CM | POA: Insufficient documentation

## 2020-03-06 DIAGNOSIS — Z79899 Other long term (current) drug therapy: Secondary | ICD-10-CM | POA: Insufficient documentation

## 2020-03-06 DIAGNOSIS — Z7984 Long term (current) use of oral hypoglycemic drugs: Secondary | ICD-10-CM | POA: Insufficient documentation

## 2020-03-06 DIAGNOSIS — Z7982 Long term (current) use of aspirin: Secondary | ICD-10-CM | POA: Insufficient documentation

## 2020-03-06 DIAGNOSIS — I2510 Atherosclerotic heart disease of native coronary artery without angina pectoris: Secondary | ICD-10-CM | POA: Insufficient documentation

## 2020-03-06 DIAGNOSIS — I252 Old myocardial infarction: Secondary | ICD-10-CM | POA: Insufficient documentation

## 2020-03-06 HISTORY — DX: Type 2 diabetes mellitus without complications: E11.9

## 2020-03-06 LAB — CBC
HCT: 38.4 % — ABNORMAL LOW (ref 39.0–52.0)
Hemoglobin: 12.8 g/dL — ABNORMAL LOW (ref 13.0–17.0)
MCH: 32.6 pg (ref 26.0–34.0)
MCHC: 33.3 g/dL (ref 30.0–36.0)
MCV: 97.7 fL (ref 80.0–100.0)
Platelets: 170 10*3/uL (ref 150–400)
RBC: 3.93 MIL/uL — ABNORMAL LOW (ref 4.22–5.81)
RDW: 13.6 % (ref 11.5–15.5)
WBC: 5.7 10*3/uL (ref 4.0–10.5)
nRBC: 0 % (ref 0.0–0.2)

## 2020-03-06 LAB — BASIC METABOLIC PANEL
Anion gap: 10 (ref 5–15)
BUN: 17 mg/dL (ref 8–23)
CO2: 28 mmol/L (ref 22–32)
Calcium: 9.5 mg/dL (ref 8.9–10.3)
Chloride: 100 mmol/L (ref 98–111)
Creatinine, Ser: 0.91 mg/dL (ref 0.61–1.24)
GFR calc Af Amer: >60 mL/min (ref >60)
GFR calc non Af Amer: >60 mL/min (ref >60)
Glucose, Bld: 105 mg/dL — ABNORMAL HIGH (ref 70–99)
Potassium: 4.2 mmol/L (ref 3.5–5.1)
Sodium: 138 mmol/L (ref 135–145)

## 2020-03-06 LAB — GLUCOSE, CAPILLARY: Glucose-Capillary: 116 mg/dL — ABNORMAL HIGH (ref 70–99)

## 2020-03-06 LAB — HEMOGLOBIN A1C
Hgb A1c MFr Bld: 6.3 % — ABNORMAL HIGH (ref 4.8–5.6)
Mean Plasma Glucose: 134.11 mg/dL

## 2020-03-06 NOTE — Progress Notes (Signed)
PCP - Dr. Yong Channel with [facility] Cardiologist - Jenkins Rouge  Chest x-ray - Not indicated EKG - 05/24/19 Stress Test - 04/29/15 ECHO - Can't recall Cardiac Cath - Denies  Sleep Study - Denies  Fasting Blood Sugar Does not check his sugar, no meter. CBG at PAT appt 116   ERAS Protcol - Yes instruction given PRE-SURGERY G2- given  COVID TEST- 03/13/20   Anesthesia review: Yes cardiac history  Patient denies shortness of breath, fever, cough and chest pain at PAT appointment   All instructions explained to the patient, with a verbal understanding of the material. Patient agrees to go over the instructions while at home for a better understanding. Patient also instructed to self quarantine after being tested for COVID-19. The opportunity to ask questions was provided.

## 2020-03-07 NOTE — Progress Notes (Signed)
Anesthesia Chart Review:  Case: [id number] Date/Time: 03/17/20 0845   Procedure: OPEN RIGHT INGUINAL HERNIA REPAIR WITH MESH (Right )   Anesthesia type: General   Pre-op diagnosis: RIGHT INGUINAL HERNIA   Location: [facility] OR ROOM 02 / [facility]   Surgeons: Ralene Ok, MD      DISCUSSION: Patient is a 69 year old male scheduled for the above procedure.   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 BMI is consistent with obesity.   Last evaluated by cardiologist Dr. Roosvelt Harps on 02/08/2020.  He discussed upcoming plans for right inguinal hernia repair.  Patient very active walking the [place] without anginal symptoms.  Dr. Johnsie Cancel felt patient was "ok to have general anesthesia." Patient on Coreg. Amlodipine added for better HTN control.   Preoperative COVID-19 testing is scheduled for 03/13/2020. Anesthesia team to evaluate on the day of surgery.    VS: BP (!) 142/89   Pulse 85   Temp 36.9 C (Oral)   Resp 18   Ht 6\' 2"  (1.88 m)   Wt 124 kg   SpO2 99%   BMI 35.10 kg/m   PROVIDERS: Marin Olp, MD is PCP Jenkins Rouge, MD is cardiologist   LABS: Labs reviewed: Acceptable for surgery. (all labs ordered are listed, but only abnormal results are displayed)  Labs Reviewed  GLUCOSE, CAPILLARY - Abnormal; Notable for the following components:      Result Value   Glucose-Capillary 116 (*)    All other components within normal limits  BASIC METABOLIC PANEL - Abnormal; Notable for the following components:   Glucose, Bld 105 (*)    All other components within normal limits  HEMOGLOBIN A1C - Abnormal; Notable for the following components:   Hgb A1c MFr Bld 6.3 (*)    All other components within normal limits  CBC - Abnormal; Notable for the following  components:   RBC 3.93 (*)    Hemoglobin 12.8 (*)    HCT 38.4 (*)    All other components within normal limits     EKG: 05/24/19 ([facility]) Normal sinus rhythm Inferior infarct, age undetermined Abnormal ECG   CV: MRI Cardiac 05/22/15: IMPRESSION: 1) Moderate LV dilatation with severe inferior wall hypokinesis consistent with previous MI. EF 42% 2) Biatrial enlargement 3) Normal RV 4) No significant ischemic MR 5) Significant Gadolinium reaction. Patient should not receive again   Nuclear stress test 04/29/15:  The left ventricular ejection fraction is moderately decreased (30-44%).  Nuclear stress EF: 37%.  There was no ST segment deviation noted during stress.  Defect 1: There is a large defect of severe severity present in the basal inferior, basal inferolateral, mid inferior, mid inferolateral, apical inferior and apical lateral location.  Findings consistent with prior myocardial infarction.  This is an intermediate risk study. (Comparison stress test 03/09/07: scar in the inferior, inferolateral and apical walls, no significant ischemia, EF 47%)   Normal carotid arteries bilaterally by 03/09/07 carotid US.   Past Medical History:  Diagnosis Date  . 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 Arthritis   . CAD (coronary artery disease)    seen by Dr. Johnsie Cancel  . Diabetes mellitus without complication ([place])   . Foot swelling   . Gout   . History of kidney stones   .  Hyperlipidemia   . Hypertension   . Myocardial infarction ([place]) 1997  . Nocturia   . Obesity   . Patellar tendonitis   . Personal history of colonic polyp-adenoma  11/20/2013  . Plantar fasciitis     Past Surgical History:  Procedure Laterality Date  . APPENDECTOMY    . CORONARY ARTERY BYPASS GRAFT  1997   3 VESSELS  . HEMICOLECTOMY Right 2007   low grade mucnous appendiceal neoplasm  . HERNIA REPAIR    . JOINT REPLACEMENT Right 2014  . TONSILLECTOMY    . TOTAL HIP  ARTHROPLASTY Right 06/12/2013   Procedure: RIGHT TOTAL HIP ARTHROPLASTY ANTERIOR APPROACH;  Surgeon: Mauri Pole, MD;  Location: [facility];  Service: Orthopedics;  Laterality: Right;    MEDICATIONS: . amLODipine (NORVASC) 5 MG tablet  . amoxicillin (AMOXIL) 500 MG capsule  . aspirin EC 81 MG tablet  . carvedilol (COREG) 3.125 MG tablet  . indomethacin (INDOCIN) 25 MG capsule  . losartan-hydrochlorothiazide (HYZAAR) 100-25 MG tablet  . metFORMIN (GLUCOPHAGE) 500 MG tablet  . nitroGLYCERIN (NITROSTAT) 0.4 MG SL tablet  . rosuvastatin (CRESTOR) 40 MG tablet   No current facility-administered medications for this encounter.  ASA on hold for surgery. Takes amoxicillin prior to dental work.    Myra Gianotti, PA-C Surgical Short Stay/Anesthesiology [facility] Phone [phone] [facility] Phone [phone] 03/07/2020 2:19 PM

## 2020-03-07 NOTE — Anesthesia Preprocedure Evaluation (Addendum)
Anesthesia Evaluation  Patient identified by MRN, date of birth, ID band Patient awake    Reviewed: Allergy & Precautions, NPO status , Patient's Chart, lab work & pertinent test results  Airway Mallampati: II  TM Distance: >3 FB Neck ROM: Full    Dental  (+) Dental Advisory Given   Pulmonary neg recent URI,    breath sounds clear to auscultation       Cardiovascular hypertension, + CAD and + Past MI   Rhythm:Regular  MRI Cardiac 05/22/15: IMPRESSION: 1) Moderate LV dilatation with severe inferior wall hypokinesis consistent with previous MI. EF 42% 2) Biatrial enlargement 3) Normal RV 4) No significant ischemic MR 5) Significant Gadolinium reaction. Patient should not receive again   Nuclear stress test 04/29/15: The left ventricular ejection fraction is moderately decreased (30-44%). Nuclear stress EF: 37%. There was no ST segment deviation noted during stress. Defect 1: There is a large defect of severe severity present in the basal inferior, basal inferolateral, mid inferior, mid inferolateral, apical inferior and apical lateral location. Findings consistent with prior myocardial infarction. This is an intermediate risk study.    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 BMI is consistent with obesity.   Last evaluated by cardiologist Dr. Roosvelt Harps on 02/08/2020.  He discussed upcoming plans for right  inguinal hernia repair.  Patient very active walking the [place] without anginal symptoms.  Dr. Johnsie Cancel felt patient was "ok to have general anesthesia." Patient on Coreg. Amlodipine added for better HTN control.   Reproductive/Obstetrics                            Anesthesia Physical Anesthesia Plan  ASA: II  Anesthesia Plan: General and Regional   Post-op Pain Management:  Regional for Post-op pain   Induction: Intravenous  PONV Risk Score and Plan: 2 and Ondansetron and Dexamethasone  Airway Management Planned: LMA and Oral ETT  Additional Equipment: None  Intra-op Plan:   Post-operative Plan:   Informed Consent: I have reviewed the patients History and Physical, chart, labs and discussed the procedure including the risks, benefits and alternatives for the proposed anesthesia with the patient or authorized representative who has indicated his/her understanding and acceptance.     Dental advisory given  Plan Discussed with: CRNA and Surgeon  Anesthesia Plan Comments: (PAT note written 03/07/2020 by Myra Gianotti, PA-C. Has cardiac clearance by Dr. Johnsie Cancel. )       Anesthesia Quick Evaluation

## 2020-03-13 ENCOUNTER — Other Ambulatory Visit (HOSPITAL_COMMUNITY)
Admission: RE | Admit: 2020-03-13 | Discharge: 2020-03-13 | Disposition: A | Discharge status: Home / Self Care | Payer: Medicare HMO | Source: Ambulatory Visit | Attending: General Surgery | Admitting: General Surgery

## 2020-03-13 DIAGNOSIS — Z20822 Contact with and (suspected) exposure to covid-19: Secondary | ICD-10-CM | POA: Diagnosis not present

## 2020-03-13 DIAGNOSIS — Z01812 Encounter for preprocedural laboratory examination: Secondary | ICD-10-CM | POA: Insufficient documentation

## 2020-03-13 LAB — SARS CORONAVIRUS 2 (TAT 6-24 HRS): SARS Coronavirus 2: NEGATIVE

## 2020-03-14 MED ORDER — DEXTROSE 5 % IV SOLN
3.0000 g | INTRAVENOUS | Status: AC
  Administered 2020-03-17: 3 g via INTRAVENOUS
  Filled 2020-03-14: qty 3

## 2020-03-17 ENCOUNTER — Encounter (HOSPITAL_COMMUNITY): Payer: Self-pay | Admitting: General Surgery

## 2020-03-17 ENCOUNTER — Ambulatory Visit (HOSPITAL_COMMUNITY)
Admission: RE | Admit: 2020-03-17 | Discharge: 2020-03-17 | Disposition: A | Discharge status: Home / Self Care | Payer: Medicare HMO | Attending: General Surgery | Admitting: General Surgery

## 2020-03-17 ENCOUNTER — Encounter (HOSPITAL_COMMUNITY)
Admission: RE | Disposition: A | Discharge status: Home / Self Care | Payer: Self-pay | Source: Home / Self Care | Attending: General Surgery

## 2020-03-17 ENCOUNTER — Ambulatory Visit (HOSPITAL_COMMUNITY): Payer: Medicare HMO | Admitting: Vascular Surgery

## 2020-03-17 ENCOUNTER — Ambulatory Visit (HOSPITAL_COMMUNITY): Payer: Medicare HMO | Admitting: Certified Registered"

## 2020-03-17 ENCOUNTER — Other Ambulatory Visit: Payer: Self-pay

## 2020-03-17 DIAGNOSIS — Z951 Presence of aortocoronary bypass graft: Secondary | ICD-10-CM | POA: Insufficient documentation

## 2020-03-17 DIAGNOSIS — I10 Essential (primary) hypertension: Secondary | ICD-10-CM | POA: Diagnosis not present

## 2020-03-17 DIAGNOSIS — E785 Hyperlipidemia, unspecified: Secondary | ICD-10-CM | POA: Insufficient documentation

## 2020-03-17 DIAGNOSIS — Z7984 Long term (current) use of oral hypoglycemic drugs: Secondary | ICD-10-CM | POA: Diagnosis not present

## 2020-03-17 DIAGNOSIS — Z79899 Other long term (current) drug therapy: Secondary | ICD-10-CM | POA: Diagnosis not present

## 2020-03-17 DIAGNOSIS — E119 Type 2 diabetes mellitus without complications: Secondary | ICD-10-CM | POA: Diagnosis not present

## 2020-03-17 DIAGNOSIS — K4090 Unilateral inguinal hernia, without obstruction or gangrene, not specified as recurrent: Secondary | ICD-10-CM | POA: Insufficient documentation

## 2020-03-17 DIAGNOSIS — I2510 Atherosclerotic heart disease of native coronary artery without angina pectoris: Secondary | ICD-10-CM | POA: Diagnosis not present

## 2020-03-17 DIAGNOSIS — G8918 Other acute postprocedural pain: Secondary | ICD-10-CM | POA: Diagnosis not present

## 2020-03-17 HISTORY — PX: INGUINAL HERNIA REPAIR: SHX194

## 2020-03-17 HISTORY — PX: INSERTION OF MESH: SHX5868

## 2020-03-17 LAB — GLUCOSE, CAPILLARY
Glucose-Capillary: 109 mg/dL — ABNORMAL HIGH (ref 70–99)
Glucose-Capillary: 142 mg/dL — ABNORMAL HIGH (ref 70–99)

## 2020-03-17 SURGERY — REPAIR, HERNIA, INGUINAL, ADULT
Anesthesia: Regional | Site: Groin | Laterality: Right

## 2020-03-17 MED ORDER — BUPIVACAINE-EPINEPHRINE 0.25% -1:200000 IJ SOLN
INTRAMUSCULAR | Status: DC | PRN
  Administered 2020-03-17: 10 mL

## 2020-03-17 MED ORDER — ACETAMINOPHEN 500 MG PO TABS: 1000.0000 mg | ORAL_TABLET | Freq: Once | ORAL | Status: DC | PRN

## 2020-03-17 MED ORDER — PROPOFOL 10 MG/ML IV BOLUS
INTRAVENOUS | Status: AC
  Filled 2020-03-17: qty 20

## 2020-03-17 MED ORDER — ORAL CARE MOUTH RINSE: 15.0000 mL | Freq: Once | OROMUCOSAL | Status: AC

## 2020-03-17 MED ORDER — BUPIVACAINE HCL (PF) 0.25 % IJ SOLN
INTRAMUSCULAR | Status: AC
  Filled 2020-03-17: qty 30

## 2020-03-17 MED ORDER — FENTANYL CITRATE (PF) 100 MCG/2ML IJ SOLN: 25.0000 ug | INTRAMUSCULAR | Status: DC | PRN

## 2020-03-17 MED ORDER — ACETAMINOPHEN 160 MG/5ML PO SOLN: 1000.0000 mg | Freq: Once | ORAL | Status: DC | PRN

## 2020-03-17 MED ORDER — ACETAMINOPHEN 10 MG/ML IV SOLN: 1000.0000 mg | Freq: Once | INTRAVENOUS | Status: DC | PRN

## 2020-03-17 MED ORDER — LIDOCAINE 2% (20 MG/ML) 5 ML SYRINGE
INTRAMUSCULAR | Status: DC | PRN
  Administered 2020-03-17: 100 mg via INTRAVENOUS

## 2020-03-17 MED ORDER — OXYCODONE HCL 5 MG PO TABS: 5.0000 mg | ORAL_TABLET | Freq: Once | ORAL | Status: DC | PRN

## 2020-03-17 MED ORDER — CHLORHEXIDINE GLUCONATE 0.12 % MT SOLN
15.0000 mL | Freq: Once | OROMUCOSAL | Status: AC
  Administered 2020-03-17: 15 mL via OROMUCOSAL
  Filled 2020-03-17: qty 15

## 2020-03-17 MED ORDER — MIDAZOLAM HCL 5 MG/5ML IJ SOLN
INTRAMUSCULAR | Status: DC | PRN
  Administered 2020-03-17: 2 mg via INTRAVENOUS

## 2020-03-17 MED ORDER — FENTANYL CITRATE (PF) 250 MCG/5ML IJ SOLN
INTRAMUSCULAR | Status: AC
  Filled 2020-03-17: qty 5

## 2020-03-17 MED ORDER — ONDANSETRON HCL 4 MG/2ML IJ SOLN
INTRAMUSCULAR | Status: DC | PRN
  Administered 2020-03-17: 4 mg via INTRAVENOUS

## 2020-03-17 MED ORDER — CHLORHEXIDINE GLUCONATE CLOTH 2 % EX PADS: 6.0000 | MEDICATED_PAD | Freq: Once | CUTANEOUS | Status: DC

## 2020-03-17 MED ORDER — SUGAMMADEX SODIUM 200 MG/2ML IV SOLN
INTRAVENOUS | Status: DC | PRN
  Administered 2020-03-17: 400 mg via INTRAVENOUS

## 2020-03-17 MED ORDER — BUPIVACAINE HCL (PF) 0.5 % IJ SOLN
INTRAMUSCULAR | Status: DC | PRN
  Administered 2020-03-17: 30 mL via PERINEURAL

## 2020-03-17 MED ORDER — ACETAMINOPHEN 500 MG PO TABS
1000.0000 mg | ORAL_TABLET | ORAL | Status: AC
  Administered 2020-03-17: 1000 mg via ORAL
  Filled 2020-03-17: qty 2

## 2020-03-17 MED ORDER — MIDAZOLAM HCL 2 MG/2ML IJ SOLN
INTRAMUSCULAR | Status: AC
  Filled 2020-03-17: qty 2

## 2020-03-17 MED ORDER — TRAMADOL HCL 50 MG PO TABS: 50.0000 mg | ORAL_TABLET | Freq: Four times a day (QID) | ORAL | 0 refills | Status: DC | PRN

## 2020-03-17 MED ORDER — LACTATED RINGERS IV SOLN: INTRAVENOUS | Status: DC

## 2020-03-17 MED ORDER — ROCURONIUM BROMIDE 10 MG/ML (PF) SYRINGE
PREFILLED_SYRINGE | INTRAVENOUS | Status: DC | PRN
  Administered 2020-03-17: 60 mg via INTRAVENOUS

## 2020-03-17 MED ORDER — PROPOFOL 10 MG/ML IV BOLUS
INTRAVENOUS | Status: DC | PRN
  Administered 2020-03-17: 200 mg via INTRAVENOUS

## 2020-03-17 MED ORDER — ENSURE PRE-SURGERY PO LIQD: 296.0000 mL | Freq: Once | ORAL | Status: DC

## 2020-03-17 MED ORDER — DEXAMETHASONE SODIUM PHOSPHATE 10 MG/ML IJ SOLN
INTRAMUSCULAR | Status: DC | PRN
  Administered 2020-03-17: 10 mg via INTRAVENOUS

## 2020-03-17 MED ORDER — FENTANYL CITRATE (PF) 100 MCG/2ML IJ SOLN
INTRAMUSCULAR | Status: DC | PRN
  Administered 2020-03-17: 100 ug via INTRAVENOUS

## 2020-03-17 MED ORDER — 0.9 % SODIUM CHLORIDE (POUR BTL) OPTIME
TOPICAL | Status: DC | PRN
  Administered 2020-03-17: 1000 mL

## 2020-03-17 MED ORDER — OXYCODONE HCL 5 MG/5ML PO SOLN: 5.0000 mg | Freq: Once | ORAL | Status: DC | PRN

## 2020-03-17 MED ORDER — STERILE WATER FOR IRRIGATION IR SOLN
Status: DC | PRN
  Administered 2020-03-17: 1000 mL

## 2020-03-17 SURGICAL SUPPLY — 47 items
BLADE CLIPPER SURG (BLADE) ×2 IMPLANT
CANISTER SUCT 3000ML PPV (MISCELLANEOUS) ×2 IMPLANT
CHLORAPREP W/TINT 26 (MISCELLANEOUS) ×2 IMPLANT
COVER SURGICAL LIGHT HANDLE (MISCELLANEOUS) ×2 IMPLANT
COVER WAND RF STERILE (DRAPES) IMPLANT
DERMABOND ADVANCED (GAUZE/BANDAGES/DRESSINGS) ×1
DERMABOND ADVANCED .7 DNX12 (GAUZE/BANDAGES/DRESSINGS) ×1 IMPLANT
DRAIN PENROSE 1/2X12 LTX STRL (WOUND CARE) ×2 IMPLANT
DRAPE LAPAROTOMY TRNSV 102X78 (DRAPES) ×2 IMPLANT
ELECT REM PT RETURN 9FT ADLT (ELECTROSURGICAL) ×2
ELECTRODE REM PT RTRN 9FT ADLT (ELECTROSURGICAL) ×1 IMPLANT
GAUZE 4X4 16PLY RFD (DISPOSABLE) ×2 IMPLANT
GLOVE BIO SURGEON STRL SZ7.5 (GLOVE) ×2 IMPLANT
GLOVE BIOGEL PI IND STRL 8 (GLOVE) ×1 IMPLANT
GLOVE BIOGEL PI INDICATOR 8 (GLOVE) ×1
GOWN STRL REUS W/ TWL LRG LVL3 (GOWN DISPOSABLE) ×1 IMPLANT
GOWN STRL REUS W/ TWL XL LVL3 (GOWN DISPOSABLE) ×1 IMPLANT
GOWN STRL REUS W/TWL LRG LVL3 (GOWN DISPOSABLE) ×2
GOWN STRL REUS W/TWL XL LVL3 (GOWN DISPOSABLE) ×2
KIT BASIN OR (CUSTOM PROCEDURE TRAY) ×2 IMPLANT
KIT TURNOVER KIT B (KITS) ×2 IMPLANT
MESH PARIETEX PROGRIP RIGHT (Mesh General) ×2 IMPLANT
NEEDLE HYPO 25GX1X1/2 BEV (NEEDLE) ×2 IMPLANT
NS IRRIG 1000ML POUR BTL (IV SOLUTION) ×2 IMPLANT
PACK GENERAL/GYN (CUSTOM PROCEDURE TRAY) ×2 IMPLANT
PAD ARMBOARD 7.5X6 YLW CONV (MISCELLANEOUS) ×4 IMPLANT
PENCIL SMOKE EVACUATOR (MISCELLANEOUS) ×2 IMPLANT
SPECIMEN JAR SMALL (MISCELLANEOUS) ×2 IMPLANT
SPONGE INTESTINAL PEANUT (DISPOSABLE) IMPLANT
SUT MNCRL AB 4-0 PS2 18 (SUTURE) ×2 IMPLANT
SUT PDS AB 0 CT 36 (SUTURE) IMPLANT
SUT PROLENE 0 SH 30 (SUTURE) ×2 IMPLANT
SUT PROLENE 2 0 SH DA (SUTURE) ×2 IMPLANT
SUT SILK 0 SH 30 (SUTURE) ×2 IMPLANT
SUT SILK 2 0 SH (SUTURE) ×2 IMPLANT
SUT VIC AB 0 CT2 27 (SUTURE) IMPLANT
SUT VIC AB 2-0 SH 27 (SUTURE) ×2
SUT VIC AB 2-0 SH 27X BRD (SUTURE) ×1 IMPLANT
SUT VIC AB 3-0 SH 27 (SUTURE) ×2
SUT VIC AB 3-0 SH 27XBRD (SUTURE) ×1 IMPLANT
SUT VICRYL AB 2 0 TIES (SUTURE) ×2 IMPLANT
SYR CONTROL 10ML LL (SYRINGE) ×2 IMPLANT
SYRINGE TOOMEY DISP (SYRINGE) ×2 IMPLANT
TOWEL GREEN STERILE (TOWEL DISPOSABLE) IMPLANT
TOWEL GREEN STERILE FF (TOWEL DISPOSABLE) ×2 IMPLANT
TRAY FOL W/BAG SLVR 16FR STRL (SET/KITS/TRAYS/PACK) ×1 IMPLANT
TRAY FOLEY W/BAG SLVR 16FR LF (SET/KITS/TRAYS/PACK) ×2

## 2020-03-17 NOTE — Discharge Instructions (Signed)
[facility] _______[facility] °INGUINAL HERNIA REPAIR: POST OP INSTRUCTIONS ° °Always review your discharge instruction sheet given to you by the facility where your surgery was performed. °IF YOU HAVE DISABILITY OR FAMILY LEAVE FORMS, YOU MUST BRING THEM TO THE OFFICE FOR PROCESSING.   °DO NOT GIVE THEM TO YOUR DOCTOR. ° °1. A  prescription for pain medication may be given to you upon discharge.  Take your pain medication as prescribed, if needed.  If narcotic pain medicine is not needed, then you may take acetaminophen (Tylenol) or ibuprofen (Advil) as needed. °2. Take your usually prescribed medications unless otherwise directed. °If you need a refill on your pain medication, please contact your pharmacy.  They will contact our office to request authorization. Prescriptions will not be filled after 5 pm or on week-ends. °3. You should follow a light diet the first 24 hours after arrival home, such as soup and crackers, etc.  Be sure to include lots of fluids daily.  Resume your normal diet the day after surgery. °4.Most patients will experience some swelling and bruising around the umbilicus or in the groin and scrotum.  Ice packs and reclining will help.  Swelling and bruising can take several days to resolve.  °6. It is common to experience some constipation if taking pain medication after surgery.  Increasing fluid intake and taking a stool softener (such as Colace) will usually help or prevent this problem from occurring.  A mild laxative (Milk of Magnesia or Miralax) should be taken according to package directions if there are no bowel movements after 48 hours. °7. Unless discharge instructions indicate otherwise, you may remove your bandages 24-48 hours after surgery, and you may shower at that time.  You may have steri-strips (small skin tapes) in place directly over the incision.  These strips should be left on the skin for 7-10 days.  If your surgeon used skin glue on the incision, you may  shower in 24 hours.  The glue will flake off over the next 2-3 weeks.  Any sutures or staples will be removed at the office during your follow-up visit. °8. ACTIVITIES:  You may resume regular (light) daily activities beginning the next day--such as daily self-care, walking, climbing stairs--gradually increasing activities as tolerated.  You may have sexual intercourse when it is comfortable.  Refrain from any heavy lifting or straining until approved by your doctor. ° °a.You may drive when you are no longer taking prescription pain medication, you can comfortably wear a seatbelt, and you can safely maneuver your car and apply brakes. °b.RETURN TO WORK:   °_____________________________________________ ° °9.You should see your doctor in the office for a follow-up appointment approximately 2-3 weeks after your surgery.  Make sure that you call for this appointment within a day or two after you arrive home to insure a convenient appointment time. °10.OTHER INSTRUCTIONS: _________________________ °   _____________________________________ ° °WHEN TO CALL YOUR DOCTOR: °1. Fever over 101.0 °2. Inability to urinate °3. Nausea and/or vomiting °4. Extreme swelling or bruising °5. Continued bleeding from incision. °6. Increased pain, redness, or drainage from the incision ° °The clinic staff is available to answer your questions during regular business hours.  Please don’t hesitate to call and ask to speak to one of the nurses for clinical concerns.  If you have a medical emergency, go to the nearest emergency room or call 911.  A surgeon from [facility] is always on call at the hospital ° ° °[address], [address], [place], [place]  [address] ? ° [address] [address], [place],    [address] °[phone] ? [phone] ? FAX [phone] °Web site: [email or web address] ° °

## 2020-03-17 NOTE — Anesthesia Procedure Notes (Signed)
Procedure Name: Intubation Date/Time: 03/17/2020 9:23 AM Performed by: Moshe Salisbury, CRNA Pre-anesthesia Checklist: Patient identified, Emergency Drugs available, Suction available and Patient being monitored Patient Re-evaluated:Patient Re-evaluated prior to induction Oxygen Delivery Method: Circle System Utilized Preoxygenation: Pre-oxygenation with 100% oxygen Induction Type: IV induction Ventilation: Oral airway inserted - appropriate to patient size Laryngoscope Size: 4 and Glidescope Grade View: Grade II Tube type: Oral Tube size: 7.5 mm Number of attempts: 3 Airway Equipment and Method: Stylet,  Oral airway and Video-laryngoscopy Placement Confirmation: ETT inserted through vocal cords under direct vision,  positive ETCO2 and breath sounds checked- equal and bilateral Secured at: 23 cm Tube secured with: Tape Dental Injury: Teeth and Oropharynx as per pre-operative assessment and Injury to lip  Difficulty Due To: Difficulty was anticipated and Difficult Airway- due to anterior larynx Future Recommendations: Recommend- induction with short-acting agent, and alternative techniques readily available Comments: Oral intubation by Pierce Crane., SRNA. Lip laceration treated with phenylephrine on gauze and Lacrilube.

## 2020-03-17 NOTE — Transfer of Care (Signed)
Immediate Anesthesia Transfer of Care Note  Patient: Shawn Anderson  Procedure(s) Performed: RIGHT INGUINAL HERNIA REPAIR (Right Groin) INSERTION OF MESH (Right Groin)  Patient Location: PACU  Anesthesia Type:General  Level of Consciousness: awake, alert , oriented and patient cooperative  Airway & Oxygen Therapy: Patient Spontanous Breathing and Patient connected to face mask oxygen  Post-op Assessment: Report given to RN, Post -op Vital signs reviewed and stable and Patient moving all extremities X 4  Post vital signs: Reviewed and stable  Last Vitals:  Vitals Value Taken Time  BP 141/95 03/17/20 1031  Temp    Pulse 64 03/17/20 1033  Resp 16 03/17/20 1033  SpO2 100 % 03/17/20 1033  Vitals shown include unvalidated device data.  Last Pain:  Vitals:   03/17/20 0733  TempSrc: Oral  PainSc: 0-No pain      Patients Stated Pain Goal: 4 (79/89/21 1941)  Complications: No complications documented.

## 2020-03-17 NOTE — Op Note (Addendum)
03/17/2020  10:15 AM  PATIENT:  Shawn Anderson  69 y.o. male  PRE-OPERATIVE DIAGNOSIS:  RIGHT INGUINAL HERNIA  POST-OPERATIVE DIAGNOSIS:  RIGHT [place] INGUINAL HERNIA  PROCEDURE:  Procedure(s): RIGHT INGUINAL HERNIA REPAIR (Right) INSERTION OF MESH (Right)  SURGEON:  Surgeon(s) and Role:    * Ralene Ok, MD - Primary  ANESTHESIA:   local and general  EBL:  5 mL   BLOOD ADMINISTERED:none  DRAINS: none   LOCAL MEDICATIONS USED:  BUPIVICAINE   SPECIMEN:  No Specimen  DISPOSITION OF SPECIMEN:  N/A  COUNTS:  YES  TOURNIQUET:  * No tourniquets in log *  DICTATION: .Dragon Dictation Details of the procedure: The patient was taken back to the operating room. The patient was placed in supine position with bilateral SCDs in place. The patient was prepped and draped in the usual sterile fashion.  After appropriate anitbiotics were confirmed, a time-out was confirmed and all facts were verified.  A 5 cm incision was made just 1 cm superior to the inguinal ligament. Bovie cautery was used to maintain hemostasis dissection is carried down to the external oblique.  A standard incision was made laterally, and the external oblique was bluntly dissected away from the surrounding tissue with Metzenbaum scissors. The external oblique was elevated in the spermatic cord was bluntly dissected away from the surrounding tissue.    The spermatic cord and the hernia were then bluntly dissected away from the pubic tubercle and a Penrose was placed around the hernia sac in the spermatic cord. The vas deferens was identified and protected at all portions of the case. Dissection of the cremasterics took place with Bovie cautery. One the hernia sac was dissected away from the surrrounding cremesteric tissue , the hernia sac was entered laterally. There was no small bowel at the base, there were no sliding components and no femoral hernia palpated. The hernia sac was highly ligated using 0 Vicryl.   This retracted into the abdomen in the usual fashion. There was also a direct component that was comprised of preperitoneal fat. 3-0 silk was used to imbricate the herniated fat.  At this time a right-sided Progrip mesh was then anchored to the pubic tubercle with a 2-0 Prolene.  It was anchored to the shelving edge of the external oblique x 1 and the conjoint tendon cephalad x 1.  The wrap around of the mesh was sutured to the conjoint tendon as well.  The new internal ring did not strangulate the spermatic cord.   The tail was then tucked under the external oblique. At this time the area was irrigated out with sterile saline.    The external oblique was reapproximated using a 2-0 Vicryl in a running fashion. Scarpa's fascia was then reapproximated using a 3-0 Vicryl running fashion. The skin was then reapproximated with 4 Monocryl in a subcuticular fashion. The skin was then dressed with Dermabond.  The patient was taken to the recovery room in stable condition.    PLAN OF CARE: DC home after PACU  PATIENT DISPOSITION:  PACU - hemodynamically stable.   Delay start of Pharmacological VTE agent (>24hrs) due to surgical blood loss or risk of bleeding: not applicable

## 2020-03-17 NOTE — Anesthesia Procedure Notes (Signed)
Anesthesia Regional Block: TAP block   Pre-Anesthetic Checklist: ,, timeout performed, Correct Patient, Correct Site, Correct Laterality, Correct Procedure, Correct Position, site marked, Risks and benefits discussed,  Surgical consent,  Pre-op evaluation,  At surgeon's request and post-op pain management  Laterality: Right  Prep: chloraprep       Needles:  Injection technique: Single-shot     Needle Length: 9cm  Needle Gauge: 22     Additional Needles: Arrow StimuQuik ECHO Echogenic Stimulating PNB Needle  Procedures:,,,, ultrasound used (permanent image in chart),,,,  Narrative:  Start time: 03/17/2020 9:08 AM End time: 03/17/2020 9:12 AM Injection made incrementally with aspirations every 5 mL.  Performed by: Personally  Anesthesiologist: Oleta Mouse, MD

## 2020-03-17 NOTE — H&P (Signed)
History of Present Illness The patient is a 69 year old male who presents with an inguinal hernia. Patient is a 69 year old male who follows back up for a right inguinal hernia. Patient has a history of diabetes, hyperlipidemia, hypertension, previous CABG. Patient comes in today with continued right inguinal hernia as well as pain. Patient continues to be active hiker. He does state that with activities and lifting notices that the hernia bulges out more.  He's had no signs or symptoms of incarceration or granulation.  ----- Referred by: Dr. Morene Rankins Chief Complaint: Right inguinal hernia  Patient is a 69 year old male with a history of diabetes, hyperlipidemia, previous CABG, who comes in with a right inguinal hernia. Patient states this is been there for approximately one to 2 months. He states that it is small. It does not cause him any discomfort. Patient does do some lifting as well as walking. He is approximately 800 miles this year. Patient had no signs or symptoms of any hernia strangulation or incarceration. Patient does state that the area does reduce when he lays down.  Patient's had a previous right paramedian incision for a appendectomy.    Allergies Contrast Media Ready-Box *MEDICAL DEVICES AND SUPPLIES*   Medication History Carvedilol (3.125MG  Tablet, Oral) Active. Losartan Potassium-HCTZ (100-25MG  Tablet, Oral) Active. metFORMIN HCl (500MG  Tablet, Oral) Active. Rosuvastatin Calcium (40MG  Tablet, Oral) Active. Medications Reconciled Nitroglycerin (0.4MG  Tab Sublingual, Sublingual) Active.    Review of Systems  General Not Present- Appetite Loss, Chills, Fatigue, Fever, Night Sweats, Weight Gain and Weight Loss. Skin Not Present- Change in Wart/Mole, Dryness, Hives, Jaundice, New Lesions, Non-Healing Wounds, Rash and Ulcer. HEENT Present- Seasonal Allergies and Wears glasses/contact lenses. Not Present- Earache, Hearing Loss, Hoarseness,  Nose Bleed, Oral Ulcers, Ringing in the Ears, Sinus Pain, Sore Throat, Visual Disturbances and Yellow Eyes. Respiratory Present- Snoring. Not Present- Bloody sputum, Chronic Cough, Difficulty Breathing and Wheezing. Breast Not Present- Breast Mass, Breast Pain, Nipple Discharge and Skin Changes. Cardiovascular Not Present- Chest Pain, Difficulty Breathing Lying Down, Leg Cramps, Palpitations, Rapid Heart Rate, Shortness of Breath and Swelling of Extremities. Gastrointestinal Present- Abdominal Pain. Not Present- Bloating, Bloody Stool, Change in Bowel Habits, Chronic diarrhea, Constipation, Difficulty Swallowing, Excessive gas, Gets full quickly at meals, Hemorrhoids, Indigestion, Nausea, Rectal Pain and Vomiting. Male Genitourinary Not Present- Blood in Urine, Change in Urinary Stream, Frequency, Impotence, Nocturia, Painful Urination, Urgency and Urine Leakage. Musculoskeletal Not Present- Back Pain, Joint Pain, Joint Stiffness, Muscle Pain, Muscle Weakness and Swelling of Extremities. Neurological Not Present- Decreased Memory, Fainting, Headaches, Numbness, Seizures, Tingling, Tremor, Trouble walking and Weakness. Psychiatric Not Present- Anxiety, Bipolar, Change in Sleep Pattern, Depression, Fearful and Frequent crying. Endocrine Not Present- Cold Intolerance, Excessive Hunger, Hair Changes, Heat Intolerance and New Diabetes. Hematology Not Present- Blood Thinners, Easy Bruising, Excessive bleeding, Gland problems, HIV and Persistent Infections. All other systems negative  BP (!) 155/90   Pulse 69   Temp 98 F (36.7 C) (Oral)   Resp 20   Ht 6\' 2"  (1.88 m)   Wt 124 kg   SpO2 99%   BMI 35.10 kg/m       Physical Exam  The physical exam findings are as follows: Note: Constitutional: No acute distress, conversant, appears stated age  Eyes: Anicteric sclerae, moist conjunctiva, no lid lag  Neck: No thyromegaly, trachea midline, no cervical lymphadenopathy  Lungs: Clear to  auscultation biilaterally, normal respiratory effot  Cardiovascular: regular rate & rhythm, no murmurs, no peripheal edema, pedal pulses 2+  GI:  Soft, no masses or hepatosplenomegaly, non-tender to palpation, right paramedian incision  MSK: Normal gait, no clubbing cyanosis, edema  Skin: No rashes, palpation reveals normal skin turgor  Psychiatric: Appropriate judgment and insight, oriented to person, place, and time  Abdomen Inspection Hernias - Right - Inguinal hernia - Reducible - Right.    Assessment & Plan RIGHT INGUINAL HERNIA (K40.90) Impression: 69 year old male with a history of diabetes, hypertension, previous CABG, and a right inguinal hernia.  Patient has a history of an right paramedian incision for an appendectomy.  1. The patient will like to proceed to the operating room for open right inguinal hernia repair with mesh.  2. I discussed with the patient the signs and symptoms of incarceration and strangulation and the need to proceed to the ER should they occur.  3. I discussed with the patient the risks and benefits of the procedure to include but not limited to: Infection, bleeding, damage to surrounding structures, possible need for further surgery, possible nerve pain, and possible recurrence. The patient was understanding and wishes to proceed.

## 2020-03-18 ENCOUNTER — Encounter (HOSPITAL_COMMUNITY): Payer: Self-pay | Admitting: General Surgery

## 2020-03-20 ENCOUNTER — Encounter (HOSPITAL_COMMUNITY): Payer: Self-pay | Admitting: General Surgery

## 2020-03-20 NOTE — Anesthesia Postprocedure Evaluation (Signed)
Anesthesia Post Note  Patient: EATHAN GROMAN  Procedure(s) Performed: RIGHT INGUINAL HERNIA REPAIR (Right Groin) INSERTION OF MESH (Right Groin)     Patient location during evaluation: PACU Anesthesia Type: General and Regional Level of consciousness: awake and alert Pain management: pain level controlled Vital Signs Assessment: post-procedure vital signs reviewed and stable Respiratory status: spontaneous breathing, nonlabored ventilation, respiratory function stable and patient connected to nasal cannula oxygen Cardiovascular status: blood pressure returned to baseline and stable Postop Assessment: no apparent nausea or vomiting Anesthetic complications: no   No complications documented.  Last Vitals:  Vitals:   03/17/20 1031 03/17/20 1046  BP: (!) 141/95 (!) 150/97  Pulse: 62 63  Resp: 13 18  Temp: 36.8 C (!) 36.3 C  SpO2: 100% 93%    Last Pain:  Vitals:   03/17/20 1046  TempSrc:   PainSc: 1                  Jon Lall

## 2020-06-04 ENCOUNTER — Other Ambulatory Visit: Payer: Self-pay | Admitting: Family Medicine

## 2020-06-05 DIAGNOSIS — R69 Illness, unspecified: Secondary | ICD-10-CM | POA: Diagnosis not present

## 2020-06-11 DIAGNOSIS — R69 Illness, unspecified: Secondary | ICD-10-CM | POA: Diagnosis not present

## 2020-07-20 ENCOUNTER — Other Ambulatory Visit: Payer: Self-pay | Admitting: Cardiovascular Disease

## 2020-08-18 NOTE — Progress Notes (Deleted)
Cardiology Office Note    Date:  08/18/2020   ID:  Shawn Anderson, DOB September 25, 1950, MRN [id number]  PCP:  Marin Olp, MD  Cardiologist: Dr. Johnsie Cancel  CC: follow up   History of Present Illness:   70 y.o. CAD/CABG 1997 HTN and Gout. Last myovue 04/29/15 inferior scar no ischemia. Cardiac MRI 05/22/15 EF 42% so no AICD. Had reaction to gadolinium with hives and pruritis so should not have again. HTN Rx with coreg norvasc and ARB. Needs to be reminded to take ASA daily. Probable gouty knee arthritis better with allopurinol  Walking trails by [employer] a lot.   No angina despite vigorous activity   Needs right inguinal hernia surgery  Had COVID vaccine in February 2021  Had right pantaloon inguinal hernia surgery with Dr Rosendo Gros 03/17/20 with no cardiac issues   ***   Past Medical History:  Diagnosis Date  . 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 Arthritis   . CAD (coronary artery disease)    seen by Dr. Johnsie Cancel  . Diabetes mellitus without complication ([place])   . Foot swelling   . Gout   . History of kidney stones   . Hyperlipidemia   . Hypertension   . Myocardial infarction ([place]) 1997  . Nocturia   . Obesity   . Patellar tendonitis   . Personal history of colonic polyp-adenoma  11/20/2013  . Plantar fasciitis     Past Surgical History:  Procedure Laterality Date  . APPENDECTOMY    . CORONARY ARTERY BYPASS GRAFT  1997   3 VESSELS  . HEMICOLECTOMY Right 2007   low grade mucnous appendiceal neoplasm  . HERNIA REPAIR    . INGUINAL HERNIA REPAIR Right 03/17/2020   Procedure: RIGHT INGUINAL HERNIA REPAIR;  Surgeon: Ralene Ok, MD;  Location: [facility];  Service: General;  Laterality: Right;  . INSERTION OF MESH Right 03/17/2020   Procedure: INSERTION OF MESH;  Surgeon: Ralene Ok, MD;  Location: [facility];  Service: General;  Laterality: Right;  . JOINT REPLACEMENT Right 2014  . TONSILLECTOMY    . TOTAL HIP ARTHROPLASTY Right 06/12/2013    Procedure: RIGHT TOTAL HIP ARTHROPLASTY ANTERIOR APPROACH;  Surgeon: Mauri Pole, MD;  Location: [facility];  Service: Orthopedics;  Laterality: Right;    Current Medications: Outpatient Medications Prior to Visit  Medication Sig Dispense Refill  . amLODipine (NORVASC) 5 MG tablet Take 1 tablet (5 mg total) by mouth daily. 90 tablet 3  . amoxicillin (AMOXIL) 500 MG capsule Take 500 mg by mouth 3 (three) times daily. Pt takes for dental appts    . aspirin EC 81 MG tablet Take 1 tablet (81 mg total) by mouth daily. (Patient not taking: Reported on 03/04/2020) 90 tablet 3  . carvedilol (COREG) 3.125 MG tablet TAKE ONE TABLET BY MOUTH TWICE A DAY WITH A MEAL 180 tablet 1  . indomethacin (INDOCIN) 25 MG capsule indomethacin 25 mg capsule  TAKE TWO CAPSULES PER DAY AFTER MEALS AS NEEDED (Patient taking differently: Take 50 mg by mouth daily as needed (pain). indomethacin 25 mg capsule  TAKE TWO CAPSULES PER DAY AFTER MEALS AS NEEDED) 60 capsule 3  . losartan-hydrochlorothiazide (HYZAAR) 100-25 MG tablet TAKE ONE TABLET BY MOUTH DAILY 90 tablet 1  . metFORMIN (GLUCOPHAGE) 500 MG tablet TAKE 1 TABLET BY MOUTH DAILY WITH BREAKFAST 90 tablet 1  . nitroGLYCERIN (NITROSTAT) 0.4 MG SL tablet Place 1 tablet (0.4 mg total) under the tongue every 5 (  five) minutes as needed for chest pain. 25 tablet 3  . rosuvastatin (CRESTOR) 40 MG tablet TAKE ONE TABLET BY MOUTH DAILY (Patient taking differently: Take 40 mg by mouth daily. ) 90 tablet 3  . traMADol (ULTRAM) 50 MG tablet Take 1 tablet (50 mg total) by mouth every 6 (six) hours as needed. 20 tablet 0   No facility-administered medications prior to visit.     Allergies:   Gadolinium derivatives and Bee venom   Social History   Socioeconomic History  . Marital status: Married    Spouse name: Not on file  . Number of children: Not on file  . Years of education: Not on file  . Highest education level: Not on file  Occupational History  . Occupation: Associate Professor: [employer]    Comment: Patent attorney  Tobacco Use  . Smoking status: Never Smoker  . Smokeless tobacco: Never Used  Vaping Use  . Vaping Use: Never used  Substance and Sexual Activity  . Alcohol use: Yes    Alcohol/week: 14.0 - 21.0 standard drinks    Types: 14 - 21 Glasses of wine per week    Comment: 2-3 glasses wine daily  . Drug use: No  . Sexual activity: Yes  Other Topics Concern  . Not on file  Social History Narrative   Married. No kids. 1 stapdaughter 31- no grandkids. 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       Hobbies: enjoys walking (plantar fasciitis inhibits and also patellar tendonitis)   Social Determinants of Health   Financial Resource Strain: Not on file  Food Insecurity: Not on file  Transportation Needs: Not on file  Physical Activity: Not on file  Stress: Not on file  Social Connections: Not on file     Family History:  The patient's family history includes Aneurysm in his father; Heart disease in his mother; Stroke in his mother.      ROS:   Please see the history of present illness.    ROS All other systems reviewed and are negative.   PHYSICAL EXAM:   VS:  There were no vitals taken for this visit.   Affect appropriate Overweight white male  HEENT: normal Neck supple with no adenopathy JVP normal no bruits no thyromegaly Lungs clear with no wheezing and good diaphragmatic motion Heart:  S1/S2 no murmur, no rub, gallop or click PMI normal post sternotomy  Abdomen: benighn, BS positve, no tenderness, no AAA no bruit.  No HSM or HJR right inguinal hernia surgery since last visit  Distal pulses intact with no bruits No edema Neuro non-focal Skin warm and dry No muscular weakness    Wt Readings from Last 3 Encounters:  03/17/20 124 kg  03/06/20 124 kg  02/08/20 123.4 kg       Studies/Labs Reviewed:   EKG:  SR rate 70 old IMI no acute changes  05/24/19 SR rate 39 old IMI no acute changes   Recent Labs: 12/04/2019: ALT 30 03/06/2020: BUN 17; Creatinine, Ser 0.91; Hemoglobin 12.8; Platelets 170; Potassium 4.2; Sodium 138   Lipid Panel    Component Value Date/Time   CHOL 180 12/04/2019 1130   CHOL 181 08/25/2016 0848   TRIG 109.0 12/04/2019 1130   TRIG 269 (HH) 08/01/2006 1039   HDL 54.30 12/04/2019 1130   HDL 49 08/25/2016 0848  CHOLHDL 3 12/04/2019 1130   VLDL 21.8 12/04/2019 1130   LDLCALC 104 (H) 12/04/2019 1130   LDLCALC 92 08/25/2016 0848   LDLDIRECT 81.0 07/20/2018 0848    Additional studies/ records that were reviewed today include:  Myoview 04/2015 Study Highlights    The left ventricular ejection fraction is moderately decreased (30-44%).  Nuclear stress EF: 37%.  There was no ST segment deviation noted during stress.  Defect 1: There is a large defect of severe severity present in the basal inferior, basal inferolateral, mid inferior, mid inferolateral, apical inferior and apical lateral location.  Findings consistent with prior myocardial infarction.  This is an intermediate risk study.      Cardiac MR 05/2015 IMPRESSION: 1) Moderate LV dilatation with severe inferior wall hypokinesis consistent with previous MI. EF 42% 2) Biatrial enlargement 3) Normal RV 4) No significant ischemic MR 5) Significant Gadolinium reaction. Patient should not receive again see Rx noted above   ASSESSMENT & PLAN:   CAD s/p CABG x3V (1997): Non ischemic myovue 2016 continue medical Rx Ok for surgery as he is active with no angina encouraged him to take his coreg bid to put his BP/HR in better range ASA 81 mg   Morbid Obesity: Discussed low carb diet and exercise   HLD: continue statin.   HTN:  Some confusion with his meds but should be on coreg, norvasc and Hyzaar   Gout: continue indocin and allopurinol try not to escalate diuretic  Rx  Hernia: post surgical correction August 2021 with no cardiac issues F/U Dr Rosendo Gros  F/U in a year    Jenkins Rouge

## 2020-08-26 ENCOUNTER — Ambulatory Visit: Payer: Medicare HMO | Admitting: Cardiovascular Disease

## 2020-10-15 ENCOUNTER — Other Ambulatory Visit: Payer: Self-pay | Admitting: Family Medicine

## 2020-10-15 NOTE — Telephone Encounter (Signed)
Please schedule an appointment with Dr.Hunter for further refills 

## 2020-10-18 ENCOUNTER — Other Ambulatory Visit: Payer: Self-pay | Admitting: Family Medicine

## 2020-10-21 ENCOUNTER — Telehealth: Payer: Self-pay | Admitting: Family Medicine

## 2020-10-21 NOTE — Chronic Care Management (AMB) (Signed)
  Chronic Care Management   Outreach Note  10/21/2020 Name: Shawn Anderson MRN: [id number] DOB: 01-21-1951  Referred by: Marin Olp, MD Reason for referral : No chief complaint on file.   An unsuccessful telephone outreach was attempted today. The patient was referred to the pharmacist for assistance with care management and care coordination.   Follow Up Plan:   SIGNATURE

## 2020-10-21 NOTE — Progress Notes (Signed)
  Chronic Care Management   Outreach Note  10/21/2020 Name: MANSFIELD DANN MRN: [id number] DOB: [birth year]  Referred by: Marin Olp, MD Reason for referral : No chief complaint on file.   An unsuccessful telephone outreach was attempted today. The patient was referred to the pharmacist for assistance with care management and care coordination.   Follow Up Plan:   Lauretta Grill Upstream Scheduler

## 2020-10-21 NOTE — Chronic Care Management (AMB) (Signed)
  Chronic Care Management   Note  10/21/2020 Name: Shawn Anderson MRN: [id number] DOB: 1951/01/17  Shawn Anderson is a 70 y.o. year old male who is a primary care patient of Marin Olp, MD. I reached out to Leron Croak by phone today in response to a referral sent by [name] PCP, Marin Olp, MD.   [name] was given information about Chronic Care Management services today including:  1. CCM service includes personalized support from designated clinical staff supervised by his physician, including individualized plan of care and coordination with other care providers 2. 24/7 contact phone numbers for assistance for urgent and routine care needs. 3. Service will only be billed when office clinical staff spend 20 minutes or more in a month to coordinate care. 4. Only one practitioner may furnish and bill the service in a calendar month. 5. The patient may stop CCM services at any time (effective at the end of the month) by phone call to the office staff.   Patient agreed to services and verbal consent obtained.   Follow up plan:   Lauretta Grill Upstream Scheduler

## 2020-10-23 ENCOUNTER — Telehealth: Payer: Self-pay

## 2020-10-23 NOTE — Progress Notes (Signed)
Chronic Care Management Pharmacy Note  10/27/2020 Name:  Shawn Anderson MRN:  [id number] DOB:  August 26, 1950  Subjective: Shawn Anderson is an 70 y.o. year old male who is a primary patient of Hunter, Brayton Mars, MD.  The CCM team was consulted for assistance with disease management and care coordination needs.    Engaged with patient by telephone for initial visit in response to provider referral for pharmacy case management and/or care coordination services.   Consent to Services:  The patient was given the following information about Chronic Care Management services today, agreed to services, and gave verbal consent: 1. CCM service includes personalized support from designated clinical staff supervised by the primary care provider, including individualized plan of care and coordination with other care providers 2. 24/7 contact phone numbers for assistance for urgent and routine care needs. 3. Service will only be billed when office clinical staff spend 20 minutes or more in a month to coordinate care. 4. Only one practitioner may furnish and bill the service in a calendar month. 5.The patient may stop CCM services at any time (effective at the end of the month) by phone call to the office staff. 6. The patient will be responsible for cost sharing (co-pay) of up to 20% of the service fee (after annual deductible is met). Patient agreed to services and consent obtained.  Patient Care Team: Marin Olp, MD as PCP - General (Family Medicine) Josue Hector, MD as PCP - Cardiology (Cardiology) Madelin Rear, [facility] as Pharmacist (Pharmacist)  Hospital visits: None in previous 6 months  Objective:  Lab Results  Component Value Date   CREATININE 0.91 03/06/2020   CREATININE 0.97 12/04/2019   CREATININE 0.87 04/24/2019    Lab Results  Component Value Date   HGBA1C 6.3 (H) 03/06/2020   Last diabetic Eye exam: No results found for: HMDIABEYEEXA  Last diabetic Foot exam: No results  found for: HMDIABFOOTEX      Component Value Date/Time   CHOL 180 12/04/2019 1130   CHOL 181 08/25/2016 0848   TRIG 109.0 12/04/2019 1130   TRIG 269 (HH) 08/01/2006 1039   HDL 54.30 12/04/2019 1130   HDL 49 08/25/2016 0848   CHOLHDL 3 12/04/2019 1130   VLDL 21.8 12/04/2019 1130   LDLCALC 104 (H) 12/04/2019 1130   LDLCALC 92 08/25/2016 0848   LDLDIRECT 81.0 07/20/2018 0848    Hepatic Function Latest Ref Rng & Units 12/04/2019 07/20/2018 03/14/2018  Total Protein 6.0 - 8.3 g/dL 7.5 7.4 7.9  Albumin 3.5 - 5.2 g/dL 4.5 4.5 4.7  AST 0 - 37 U/L 27 30 41(H)  ALT 0 - 53 U/L 30 27 42  Alk Phosphatase 39 - 117 U/L 58 54 61  Total Bilirubin 0.2 - 1.2 mg/dL 1.1 0.9 1.5(H)  Bilirubin, Direct 0.00 - 0.40 mg/dL - - -    Lab Results  Component Value Date/Time   TSH 4.85 (H) 11/13/2014 08:31 AM   TSH 2.92 08/22/2013 03:49 PM    CBC Latest Ref Rng & Units 03/06/2020 12/04/2019 03/14/2018  WBC 4.0 - 10.5 K/uL 5.7 4.4 5.5  Hemoglobin 13.0 - 17.0 g/dL 12.8(L) 12.9(L) 13.7  Hematocrit 39.0 - 52.0 % 38.4(L) 37.8(L) 40.3  Platelets 150 - 400 K/uL 170 186.0 184.0   No results found for: VD25OH  Clinical ASCVD: Yes  The 10-year ASCVD risk score [name] DC Jr., et al., 2013) is: 23%   Values used to calculate the score:     Age: 71  years     Sex: Male     Is Non-Hispanic African American: No     Diabetic: No     Tobacco smoker: No     Systolic Blood Pressure: 371 mmHg     Is BP treated: Yes     HDL Cholesterol: 54.3 mg/dL     Total Cholesterol: 180 mg/dL    Social History   Tobacco Use  Smoking Status Never Smoker  Smokeless Tobacco Never Used   BP Readings from Last 3 Encounters:  03/17/20 (!) 150/97  03/06/20 (!) 142/89  02/08/20 (!) 142/90   Pulse Readings from Last 3 Encounters:  03/17/20 63  03/06/20 85  02/08/20 77   Wt Readings from Last 3 Encounters:  03/17/20 273 lb 6.4 oz (124 kg)  03/06/20 273 lb 6.4 oz (124 kg)  02/08/20 272 lb (123.4 kg)   Assessment: Review of  patient past medical history, allergies, medications, health status, including review of consultants reports, laboratory and other test data, was performed as part of comprehensive evaluation and provision of chronic care management services.   SDOH:  (Social Determinants of Health) assessments and interventions performed:    CCM Care Plan  Allergies  Allergen Reactions  . Gadolinium Derivatives Shortness Of Breath, Itching, Swelling, Cough and Anaphylaxis    Symptoms include: SOB, wheezing, throat numbness, cough, congestion of nasal cavities, itching of hand and foot, and swelling. Contrast agent given- MultiHance.  Raelyn Ensign Venom     Medications Reviewed Today    Reviewed by Madelin Rear, [facility] (Pharmacist) on 10/27/20 at 1325  Med List Status: <None>  Medication Order Taking? Sig Documenting Provider Last Dose Status Informant  amLODipine (NORVASC) 5 MG tablet [id number] No Take 1 tablet (5 mg total) by mouth daily. Josue Hector, MD 03/17/2020 0600 Active Self  amoxicillin (AMOXIL) 500 MG capsule [id number] No Take 500 mg by mouth 3 (three) times daily. Pt takes for dental appts [provider] More than a month Unknown time Active Self           Med Note Morton Amy, Clearnce Sorrel   Tue Mar 14, 2018  1:39 PM) Pt only takes prophyllacitcally  aspirin EC 81 MG tablet [id number] No Take 1 tablet (81 mg total) by mouth daily.  Patient not taking: Reported on 03/04/2020   Josue Hector, MD Not Taking Unknown time Active Self  carvedilol (COREG) 3.125 MG tablet [id number]  TAKE ONE TABLET BY MOUTH TWICE A DAY WITH A MEAL Josue Hector, MD  Active   indomethacin (INDOCIN) 25 MG capsule [id number] No indomethacin 25 mg capsule  TAKE TWO CAPSULES PER DAY AFTER MEALS AS NEEDED  Patient taking differently: Take 50 mg by mouth daily as needed (pain). indomethacin 25 mg capsule  TAKE TWO CAPSULES PER DAY AFTER MEALS AS NEEDED   Marin Olp, MD More than a month Unknown time Active Self   losartan-hydrochlorothiazide (HYZAAR) 100-25 MG tablet [id number]  TAKE ONE TABLET BY MOUTH DAILY Josue Hector, MD  Active   metFORMIN (GLUCOPHAGE) 500 MG tablet [id number]  TAKE 1 TABLET BY MOUTH DAILY WITH BREAKFAST Marin Olp, MD  Active   nitroGLYCERIN (NITROSTAT) 0.4 MG SL tablet [id number]  Place 1 tablet (0.4 mg total) under the tongue every 5 (five) minutes as needed for chest pain. Josue Hector, MD  Active Self  rosuvastatin (CRESTOR) 40 MG tablet [id number]  TAKE ONE TABLET BY MOUTH DAILY Marin Olp, MD  Active   traMADol Veatrice Bourbon)  50 MG tablet [id number]  Take 1 tablet (50 mg total) by mouth every 6 (six) hours as needed. Ralene Ok, MD  Active           Patient Active Problem List   Diagnosis Date Noted  . Hyperglycemia, unspecified 12/04/2019  . Plantar fasciitis   . Left hip pain 11/20/2014  . Dermatitis 11/20/2014  . Personal history of colonic polyp-adenoma  11/20/2013  . Obese 06/13/2013  . S/P right THA, AA 06/12/2013  . Left foot pain 12/19/2012  . Fatty liver 12/19/2012  . Actinic keratosis 08/03/2012  . Gout 09/01/2009  . Hyperlipidemia 02/21/2007  . Essential hypertension 02/21/2007  . CAD (coronary artery disease) 02/21/2007  . Kidney stone 02/21/2007   Immunization History  Administered Date(s) Administered  . PFIZER(Purple Top)SARS-COV-2 Vaccination 09/06/2019, 09/27/2019  . Pneumococcal Conjugate-13 11/20/2014  . Pneumococcal Polysaccharide-23 09/27/2016  . Pneumococcal-Unspecified 08/03/2015  . Tdap 01/29/2008, 02/26/2013  . Zoster 08/22/2013   Conditions to be addressed/monitored: HTN, HLD and CAD (MI 1997 - tripple bypass CABG), Gout, DM Hx  Care Plan : [place]  Updates made by Madelin Rear, [facility] since 10/27/2020 12:00 AM    Problem: HLD HTN PreDM Gout CAD   Priority: High    Long-Range Goal: Disease Management   Start Date: 10/27/2020  Expected End Date: 10/27/2021  This Visit's Progress: On track   Priority: High  Note:   Patient Goals/Self-Care Activities . Patient will:  - take medications as prescribed engage in dietary modifications by reducing intake of 1 bottle/day of red wine - either by cold [place] or half a bottle/day  Current Barriers:  . Suboptimal therapeutic regimen for HLD  . Working towards lifestyle interventions - reduce red wine intake . Medication compliance (carvedilol primarily)   Pharmacist Clinical Goal(s):  Marland Kitchen Patient will achieve adherence to monitoring guidelines and medication adherence to achieve therapeutic efficacy . contact provider office for questions/concerns as evidenced notation of same in electronic health record through collaboration with PharmD and provider.   Interventions: . 1:1 collaboration with Marin Olp, MD regarding development and update of comprehensive plan of care as evidenced by provider attestation and co-signature . Inter-disciplinary care team collaboration (see longitudinal plan of care) . Comprehensive medication review performed; medication list updated in electronic medical record . Pt requesting recommendation for podiatrist within [facility] system  due to painful calluses on toes. Reports not needing an actual referral with current insurance plan . No medication changes/recommendations.  Hypertension (BP goal <140/90) -Controlled -Reports good compliance with AM medications, but probably takes PM carvedilol 4 out of every 7 days of week. -Current treatment: . Losartan-HCTZ 100-25 mg once daily  . Carvedilol 3.125 mg twice daily  . Amlodipine 5 mg  -Medications previously tried: irbesartan, valsartan, carvedilol 6.25 mg    -Current home readings: not routinely checking -Current dietary habits: minimal processed foods, avoids sweets. Currently red wine 1 bottle/day - per pt next step to go to half a bottle or cold [place].  -Current exercise habits: walking 2-4 miles per day. Has walked 250 miles so far this  year. -Denies hypotensive/hypertensive symptoms -Educated on BP goals and benefits of medications for prevention of heart attack, stroke and kidney damage; Exercise goal of 150 minutes per week; Reviewed impact of alcohol on BP  -Counseled to monitor BP at home 1-2x/week, document, and provide log at future appointments. Discussed working towards associating evening meal with 2nd carvedilol dose.  -Has f/u scheduled with Dr Johnsie Cancel 10/2020  Hyperlipidemia: (LDL goal < 70) -Not ideally controlled -DM HLD HTN CAD fatty liver  -Previously declined Zetia - per pt insurance does not cover, had been under the impression this would help lower a1c.  -Current treatment: . Rosuvastatin 40 mg once daily  -Medications previously tried:   -Educated on: Benefits of statin for ASCVD risk reduction; reviewed roles of different cholesterol medications  -Recommended to continue current medication -Reviewed fill hx - no gaps noted on this Rx either -Pt plans to get physical/labs scheduled for sometime next couple of months -Will review insurance/discuss introduction of zetia after physical  PreDM (Goal: a1c <5.7%) -Not ideally controlled  -Previous DMII dgx noted in Hx. Highest a1c 6.6% in chart.  -Tolerating metformin well if remembering to take with meals -Current treatment  . Metformin 500 mg once daily with breakfast -Medications previously tried: n/a  -Counseled on diet and exercise extensively  Follow Up Plan: 3 month telephone f/u visit scheduled - will review HLD, HTN, lifestyle (red wine intake) patient will be scheduling physical May/June    Medication Assistance: None required.  Patient affirms current coverage meets needs.  Patient's preferred pharmacy is:  [facility] [address] [place] [address] Phone: [phone] Fax: [phone]  Uses pill box? Yes  Follow Up:  Patient agrees to Care Plan and  Follow-up.  Future Appointments  Date Time Provider [place]  11/07/2020  9:00 AM Josue Hector, MD [facility]  01/27/2021  9:00 AM [facility] CCM PHARMACIST [facility]   Madelin Rear, Pharm.D., BCGP Clinical Pharmacist [place] [phone]

## 2020-10-23 NOTE — Patient Instructions (Signed)
[name],  Thank you for taking the time to review your medications with me today.  I have included our care plan/goals in the following pages. Please review and call me at [phone] with any questions!  Thanks! Ellin Mayhew, Pharm.D., BCGP Clinical Pharmacist [facility] [place] [phone] Patient Care Plan: [place] Plan    Problem Identified: HLD HTN PreDM Gout CAD   Priority: High    Long-Range Goal: Disease Management   Start Date: 10/27/2020  Expected End Date: 10/27/2021  This Visit's Progress: On track  Priority: High  Note:   Patient Goals/Self-Care Activities . Patient will:  - take medications as prescribed engage in dietary modifications by reducing intake of 1 bottle/day of red wine - either by cold [place] or half a bottle/day  Follow Up Plan: 3 month telephone f/u visit scheduled - will review HLD, HTN, lifestyle (red wine intake) patient will be scheduling physical May/June Current Barriers:  . Suboptimal therapeutic regimen for HLD  . Working towards lifestyle interventions - reduce red wine intake . Medication compliance (carvedilol primarily)   Pharmacist Clinical Goal(s):  Marland Kitchen Patient will achieve adherence to monitoring guidelines and medication adherence to achieve therapeutic efficacy . contact provider office for questions/concerns as evidenced notation of same in electronic health record through collaboration with PharmD and provider.   Interventions: . 1:1 collaboration with Marin Olp, MD regarding development and update of comprehensive plan of care as evidenced by provider attestation and co-signature . Inter-disciplinary care team collaboration (see longitudinal plan of care) . Comprehensive medication review performed; medication list updated in electronic medical record . Pt requesting recommendation for podiatrist within [facility]  due to painful calluses on toes.  Reports not needing an actual referral with current insurance plan . No medication changes/recommendations.  Hypertension (BP goal <140/90) -Controlled -Reports good compliance with AM medications, but probably takes PM carvedilol 4 out of every 7 days of week. -Current treatment: . Losartan-HCTZ 100-25 mg once daily  . Carvedilol 3.125 mg twice daily  . Amlodipine 5 mg  -Medications previously tried: irbesartan, valsartan, carvedilol 6.25 mg    -Current home readings: not routinely checking -Current dietary habits: minimal processed foods, avoids sweets. Currently red wine 1 bottle/day - per pt next step to go to half a bottle or cold [place].  -Current exercise habits: walking 2-4 miles per day. Has walked 250 miles so far this year. -Denies hypotensive/hypertensive symptoms -Educated on BP goals and benefits of medications for prevention of heart attack, stroke and kidney damage; Exercise goal of 150 minutes per week; Reviewed impact of alcohol on BP  -Counseled to monitor BP at home 1-2x/week, document, and provide log at future appointments. Discussed working towards associating evening meal with 2nd carvedilol dose.  -Has f/u scheduled with Dr Johnsie Cancel 10/2020  Hyperlipidemia: (LDL goal < 70) -Not ideally controlled -DM HLD HTN CAD fatty liver  -Previously declined Zetia - per pt insurance does not cover, had been under the impression this would help lower a1c.  -Current treatment: . Rosuvastatin 40 mg once daily  -Medications previously tried:   -Educated on: Benefits of statin for ASCVD risk reduction; reviewed roles of different cholesterol medications  -Recommended to continue current medication -Reviewed fill hx - no gaps noted on this Rx either -Pt plans to get physical/labs scheduled for sometime next couple of months -Will review insurance/discuss introduction of zetia after physical  PreDM (Goal: a1c <5.7%) -Not ideally  controlled  -Previous DMII dgx noted in Hx.  Highest a1c 6.6% in chart.  -Tolerating metformin well if remembering to take with meals -Current treatment  . Metformin 500 mg once daily with breakfast -Medications previously tried: n/a  -Counseled on diet and exercise extensively      The patient verbalized understanding of instructions provided today and agreed to receive a MyChart copy of patient instruction and/or educational materials. Telephone follow up appointment with pharmacy team member scheduled for: See next appointment with "Care Management Staff" under "What's Next" below.

## 2020-10-23 NOTE — Chronic Care Management (AMB) (Signed)
Chronic Care Management Pharmacy Assistant   Name: Shawn Anderson  MRN: [id number] DOB: November 02, 1950  Shawn Anderson is an 70 y.o. year old male who presents for his initial CCM visit with the clinical pharmacist.  Reason for Encounter: Initial Call   Conditions to be addressed/monitored: CAD, HTN, HLD and Hyperglycemia  Recent office visits:  n/a  Recent consult visits:  [facility] visits:  None in previous 6 months  Medications: Outpatient Encounter Medications as of 10/23/2020  Medication Sig Note  . amLODipine (NORVASC) 5 MG tablet Take 1 tablet (5 mg total) by mouth daily.   Marland Kitchen amoxicillin (AMOXIL) 500 MG capsule Take 500 mg by mouth 3 (three) times daily. Pt takes for dental appts 03/14/2018: Pt only takes prophyllacitcally  . aspirin EC 81 MG tablet Take 1 tablet (81 mg total) by mouth daily. (Patient not taking: Reported on 03/04/2020)   . carvedilol (COREG) 3.125 MG tablet TAKE ONE TABLET BY MOUTH TWICE A DAY WITH A MEAL   . indomethacin (INDOCIN) 25 MG capsule indomethacin 25 mg capsule  TAKE TWO CAPSULES PER DAY AFTER MEALS AS NEEDED (Patient taking differently: Take 50 mg by mouth daily as needed (pain). indomethacin 25 mg capsule  TAKE TWO CAPSULES PER DAY AFTER MEALS AS NEEDED)   . losartan-hydrochlorothiazide (HYZAAR) 100-25 MG tablet TAKE ONE TABLET BY MOUTH DAILY   . metFORMIN (GLUCOPHAGE) 500 MG tablet TAKE 1 TABLET BY MOUTH DAILY WITH BREAKFAST   . nitroGLYCERIN (NITROSTAT) 0.4 MG SL tablet Place 1 tablet (0.4 mg total) under the tongue every 5 (five) minutes as needed for chest pain.   . rosuvastatin (CRESTOR) 40 MG tablet TAKE ONE TABLET BY MOUTH DAILY   . traMADol (ULTRAM) 50 MG tablet Take 1 tablet (50 mg total) by mouth every 6 (six) hours as needed.    No facility-administered encounter medications on file as of 10/23/2020.    Any changes in your medications or health? Patient states he has not had any changes in his medications or health  recently.  Any side effects from any medications?  Patient states he doesn't really have any side effects from his medications other than Metformin may cause diarrhea if he doesn't take with food. Patient states as long as he takes his Metformin with food he doesn't have any side effects.  Do you have any symptoms or problems not managed by your medications? Patient states he has tendonitis in his patella and ankle that flares up at times. Patient states he is really active and walks a lot. He states when he walks a lot sometimes it will flare up his tendonitis. Patient states he will usually take indomethacin for his flare up. Patient also states that he has a history of gout from taking HCTZ. Patient states he has not had a flare up of gout in about a year. Patient states he is currently on combination of Losartan/HCTZ and so far has been okay with taking it.   Any concerns about your health right now? Patient states he doesn't have any major concerns right now. Patient states he had a heart attack in 1997 that required him to have a triple bypass surgery. Patient states he urinates a lot at night so he keeps a hospital urinal by his bedside.  Has your provider asked that you check blood pressure, blood sugar, or follow special diet at home? Patient states he does not currently check his blood pressure or blood sugars at home. Patient states he  should check his blood pressure at home but doesn't like to as it makes him nervous. Patient states if he checks his blood pressure at home and it's elevated it will cause him to worry about it so he doesn't check it at all. Patient states he does not follow a specific diet at home but he tries his best to eat health. Patient states he eats chicken, pork and fish. He states he limits red meat and eats sweet potatoes once a week. Patient states he doesn't drink sugary drinks at all.  Do you get any type of exercise on a regular basis? Patient states he has  walked about 300 miles since January and about 3,000 miles in the last 3 years. Patient [place] about 3-5 miles a day.  Can you think of a goal you would like to reach for your health? Patient states he would like to lose some weight. He states he's currently at 270 lbs (down from 290) and would like to closer to 265. He also states he would like to get his blood pressure under control better than it is.  Do you have any problems getting your medications? Patient states he currently does not have any problems getting his medications. He states he currently uses auto refill at [facility] and states he doesn't pay for any of his medications.  Is there anything that you would like to discuss during the appointment? Patient states he would like to discuss his blood pressure. He states he would like to be able to control it better than it is now.  Please bring medications and supplements to appointment.  Future Appointments  Date Time Provider [place]  10/27/2020 11:00 AM [facility] CCM PHARMACIST [facility]  11/07/2020  9:00 AM Josue Hector, MD [facility]    April D Calhoun, [place] Pharmacist Assistant [phone]

## 2020-10-27 ENCOUNTER — Ambulatory Visit (INDEPENDENT_AMBULATORY_CARE_PROVIDER_SITE_OTHER): Payer: Medicare HMO

## 2020-10-27 DIAGNOSIS — E785 Hyperlipidemia, unspecified: Secondary | ICD-10-CM

## 2020-10-27 DIAGNOSIS — I10 Essential (primary) hypertension: Secondary | ICD-10-CM

## 2020-10-28 NOTE — Progress Notes (Signed)
Pt informed of podiatry recommendation.

## 2020-10-29 NOTE — Progress Notes (Deleted)
Cardiology Office Note    Date:  10/29/2020   ID:  Shawn Anderson, DOB Apr 28, 1951, MRN [id number]  PCP:  Marin Olp, MD  Cardiologist: Dr. Johnsie Cancel  CC: follow up   History of Present Illness:   70 y.o. CAD/CABG 1997 HTN and Gout. Last myovue 04/29/15 inferior scar no ischemia. Cardiac MRI 05/22/15 EF 42% so no AICD. Had reaction to gadolinium with hives and pruritis so should not have again. HTN Rx with coreg and ARB. Needs to be reminded to take ASA daily. Probable gouty knee arthritis better with allopurinol  Walking trails by [employer] a lot.   No angina despite vigorous activity   Had COVID vaccine I   Norvasc started for BP 12/06/79   Had uncomplicated right inguinal hernia surgery Dr Rosendo Gros 03/17/20  ***      Past Medical History:  Diagnosis Date  . 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 Arthritis   . CAD (coronary artery disease)    seen by Dr. Johnsie Cancel  . Diabetes mellitus without complication ([place])   . Foot swelling   . Gout   . History of kidney stones   . Hyperlipidemia   . Hypertension   . Myocardial infarction (Shawn Anderson) 1997  . Nocturia   . Obesity   . Patellar tendonitis   . Personal history of colonic polyp-adenoma  11/20/2013  . Plantar fasciitis     Past Surgical History:  Procedure Laterality Date  . APPENDECTOMY    . CORONARY ARTERY BYPASS GRAFT  1997   3 VESSELS  . HEMICOLECTOMY Right 2007   low grade mucnous appendiceal neoplasm  . HERNIA REPAIR    . INGUINAL HERNIA REPAIR Right 03/17/2020   Procedure: RIGHT INGUINAL HERNIA REPAIR;  Surgeon: Ralene Ok, MD;  Location: [facility];  Service: General;  Laterality: Right;  . INSERTION OF MESH Right 03/17/2020   Procedure: INSERTION OF MESH;  Surgeon: Ralene Ok, MD;  Location: [place];  Service: General;  Laterality: Right;  . JOINT REPLACEMENT Right 2014  . TONSILLECTOMY    . TOTAL HIP ARTHROPLASTY Right 06/12/2013   Procedure: RIGHT TOTAL HIP ARTHROPLASTY ANTERIOR  APPROACH;  Surgeon: Mauri Pole, MD;  Location: [facility];  Service: Orthopedics;  Laterality: Right;    Current Medications: Outpatient Medications Prior to Visit  Medication Sig Dispense Refill  . amLODipine (NORVASC) 5 MG tablet Take 1 tablet (5 mg total) by mouth daily. 90 tablet 3  . amoxicillin (AMOXIL) 500 MG capsule Take 500 mg by mouth 3 (three) times daily. Pt takes for dental appts    . aspirin EC 81 MG tablet Take 1 tablet (81 mg total) by mouth daily. (Patient not taking: Reported on 03/04/2020) 90 tablet 3  . carvedilol (COREG) 3.125 MG tablet TAKE ONE TABLET BY MOUTH TWICE A DAY WITH A MEAL 180 tablet 1  . indomethacin (INDOCIN) 25 MG capsule indomethacin 25 mg capsule  TAKE TWO CAPSULES PER DAY AFTER MEALS AS NEEDED (Patient taking differently: Take 50 mg by mouth daily as needed (pain). indomethacin 25 mg capsule  TAKE TWO CAPSULES PER DAY AFTER MEALS AS NEEDED) 60 capsule 3  . losartan-hydrochlorothiazide (HYZAAR) 100-25 MG tablet TAKE ONE TABLET BY MOUTH DAILY 90 tablet 1  . metFORMIN (GLUCOPHAGE) 500 MG tablet TAKE 1 TABLET BY MOUTH DAILY WITH BREAKFAST 90 tablet 1  . nitroGLYCERIN (NITROSTAT) 0.4 MG SL tablet Place 1 tablet (0.4 mg total) under the tongue every 5 (five) minutes as needed  for chest pain. 25 tablet 3  . rosuvastatin (CRESTOR) 40 MG tablet TAKE ONE TABLET BY MOUTH DAILY 90 tablet 3  . traMADol (ULTRAM) 50 MG tablet Take 1 tablet (50 mg total) by mouth every 6 (six) hours as needed. 20 tablet 0   No facility-administered medications prior to visit.     Allergies:   Gadolinium derivatives and Bee venom   Social History   Socioeconomic History  . Marital status: Married    Spouse name: Not on file  . Number of children: Not on file  . Years of education: Not on file  . Highest education level: Not on file  Occupational History  . Occupation: Product manager: [employer]    Comment: Patent attorney  Tobacco Use  . Smoking status: Never  Smoker  . Smokeless tobacco: Never Used  Vaping Use  . Vaping Use: Never used  Substance and Sexual Activity  . Alcohol use: Yes    Alcohol/week: 14.0 - 21.0 standard drinks    Types: 14 - 21 Glasses of wine per week    Comment: 2-3 glasses wine daily  . Drug use: No  . Sexual activity: Yes  Other Topics Concern  . Not on file  Social History Narrative   Married. No kids. 1 stapdaughter 31- no grandkids. 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       Hobbies: enjoys walking (plantar fasciitis inhibits and also patellar tendonitis)   Social Determinants of Health   Financial Resource Strain: Not on file  Food Insecurity: Not on file  Transportation Needs: Not on file  Physical Activity: Not on file  Stress: Not on file  Social Connections: Not on file     Family History:  The patient's family history includes Aneurysm in his father; Heart disease in his mother; Stroke in his mother.      ROS:   Please see the history of present illness.    ROS All other systems reviewed and are negative.   PHYSICAL EXAM:   VS:  There were no vitals taken for this visit.   Affect appropriate Overweight white male  HEENT: normal Neck supple with no adenopathy JVP normal no bruits no thyromegaly Lungs clear with no wheezing and good diaphragmatic motion Heart:  S1/S2 no murmur, no rub, gallop or click PMI normal post sternotomy  Abdomen: benighn, BS positve, no tenderness, no AAA no bruit.  No HSM or HJR right inguinal hernia post repair Distal pulses intact with no bruits No edema Neuro non-focal Skin warm and dry No muscular weakness    Wt Readings from Last 3 Encounters:  03/17/20 124 kg  03/06/20 124 kg  02/08/20 123.4 kg      Studies/Labs Reviewed:   EKG:  SR rate 60 old IMI no acute changes  05/24/19 SR rate 57 old IMI no acute changes    Recent Labs: 12/04/2019: ALT 30 03/06/2020: BUN 17; Creatinine, Ser 0.91; Hemoglobin 12.8; Platelets 170; Potassium 4.2; Sodium 138   Lipid Panel    Component Value Date/Time   CHOL 180 12/04/2019 1130   CHOL 181 08/25/2016 0848   TRIG 109.0 12/04/2019 1130   TRIG 269 (HH) 08/01/2006 1039   HDL 54.30 12/04/2019 1130   HDL 49 08/25/2016 0848   CHOLHDL 3 12/04/2019 1130   VLDL 21.8 12/04/2019 1130   LDLCALC 104 (H)  12/04/2019 1130   LDLCALC 92 08/25/2016 0848   LDLDIRECT 81.0 07/20/2018 0848    Additional studies/ records that were reviewed today include:  Myoview 04/2015 Study Highlights    The left ventricular ejection fraction is moderately decreased (30-44%).  Nuclear stress EF: 37%.  There was no ST segment deviation noted during stress.  Defect 1: There is a large defect of severe severity present in the basal inferior, basal inferolateral, mid inferior, mid inferolateral, apical inferior and apical lateral location.  Findings consistent with prior myocardial infarction.  This is an intermediate risk study.      Cardiac MR 05/2015 IMPRESSION: 1) Moderate LV dilatation with severe inferior wall hypokinesis consistent with previous MI. EF 42% 2) Biatrial enlargement 3) Normal RV 4) No significant ischemic MR 5) Significant Gadolinium reaction. Patient should not receive again see Rx noted above   ASSESSMENT & PLAN:   CAD s/p CABG x3V (1997): Non ischemic myovue 2016 continue medical Rx Ok for surgery as he is active with no angina encouraged him to take his coreg bid to put his BP/HR in better range ASA 81 mg   Morbid Obesity: Discussed low carb diet and exercise   HLD: continue statin.   HTN: improved with addition of Norvasc   Gout: continue indocin and allopurinol try not to escalate diuretic Rx  Inguinal Hernia:  Right post repair improved   F/U in a year    Jenkins Rouge

## 2020-11-06 ENCOUNTER — Telehealth: Payer: Self-pay

## 2020-11-06 DIAGNOSIS — E785 Hyperlipidemia, unspecified: Secondary | ICD-10-CM

## 2020-11-06 DIAGNOSIS — I10 Essential (primary) hypertension: Secondary | ICD-10-CM

## 2020-11-06 DIAGNOSIS — Z125 Encounter for screening for malignant neoplasm of prostate: Secondary | ICD-10-CM

## 2020-11-06 DIAGNOSIS — Z0000 Encounter for general adult medical examination without abnormal findings: Secondary | ICD-10-CM

## 2020-11-06 DIAGNOSIS — R739 Hyperglycemia, unspecified: Secondary | ICD-10-CM

## 2020-11-06 NOTE — Telephone Encounter (Signed)
Orders placed, ok to schedule lab visit a few days prior to CPE.

## 2020-11-06 NOTE — Telephone Encounter (Signed)
Scheduled lab visit.

## 2020-11-06 NOTE — Telephone Encounter (Signed)
Pt called to schedule a CPE with Dr. Yong Channel. Pt asked if Dr. Yong Channel would place lab orders for them to discuss results during his CPE visit. Please advise.

## 2020-11-07 ENCOUNTER — Ambulatory Visit: Payer: Medicare HMO | Admitting: Cardiovascular Disease

## 2020-11-12 ENCOUNTER — Other Ambulatory Visit: Payer: Self-pay

## 2020-11-12 DIAGNOSIS — I10 Essential (primary) hypertension: Secondary | ICD-10-CM

## 2020-11-12 DIAGNOSIS — E785 Hyperlipidemia, unspecified: Secondary | ICD-10-CM

## 2020-11-13 ENCOUNTER — Other Ambulatory Visit: Payer: Self-pay | Admitting: Family Medicine

## 2020-11-13 ENCOUNTER — Other Ambulatory Visit: Payer: Self-pay | Admitting: Cardiovascular Disease

## 2020-12-09 DIAGNOSIS — H524 Presbyopia: Secondary | ICD-10-CM | POA: Diagnosis not present

## 2020-12-09 DIAGNOSIS — H5213 Myopia, bilateral: Secondary | ICD-10-CM | POA: Diagnosis not present

## 2021-01-12 ENCOUNTER — Other Ambulatory Visit: Payer: Self-pay | Admitting: Cardiovascular Disease

## 2021-01-19 DIAGNOSIS — Z0100 Encounter for examination of eyes and vision without abnormal findings: Secondary | ICD-10-CM | POA: Diagnosis not present

## 2021-01-22 ENCOUNTER — Telehealth: Payer: Self-pay

## 2021-01-22 NOTE — Chronic Care Management (AMB) (Signed)
Chronic Care Management Pharmacy Assistant   Name: Shawn Anderson  MRN: [id number] DOB: 06/12/1951  Reason for Encounter: Hypertension Disease State Call  Recent office visits:  No visits noted  Recent consult visits:  No visits noted  Hospital visits:  None in previous 6 months  Medications: Outpatient Encounter Medications as of 01/22/2021  Medication Sig Note   amLODipine (NORVASC) 5 MG tablet TAKE ONE TABLET BY MOUTH DAILY    amoxicillin (AMOXIL) 500 MG capsule Take 500 mg by mouth 3 (three) times daily. Pt takes for dental appts 03/14/2018: Pt only takes prophyllacitcally   aspirin EC 81 MG tablet Take 1 tablet (81 mg total) by mouth daily. (Patient not taking: Reported on 03/04/2020)    carvedilol (COREG) 3.125 MG tablet Take 1 tablet (3.125 mg total) by mouth 2 (two) times daily with a meal. Please keep upcoming appt in August 2022 with Dr. Johnsie Cancel before anymore refills. Thank you    indomethacin (INDOCIN) 25 MG capsule indomethacin 25 mg capsule  TAKE TWO CAPSULES PER DAY AFTER MEALS AS NEEDED (Patient taking differently: Take 50 mg by mouth daily as needed (pain). indomethacin 25 mg capsule  TAKE TWO CAPSULES PER DAY AFTER MEALS AS NEEDED)    losartan-hydrochlorothiazide (HYZAAR) 100-25 MG tablet Take 1 tablet by mouth daily. Please keep upcoming appt in August 2022 with Dr. Johnsie Cancel before anymore refills. Thank you    metFORMIN (GLUCOPHAGE) 500 MG tablet TAKE ONE TABLET BY MOUTH DAILY WITH BREAKFAST    nitroGLYCERIN (NITROSTAT) 0.4 MG SL tablet Place 1 tablet (0.4 mg total) under the tongue every 5 (five) minutes as needed for chest pain.    rosuvastatin (CRESTOR) 40 MG tablet TAKE 1 TABLET BY MOUTH DAILY    traMADol (ULTRAM) 50 MG tablet Take 1 tablet (50 mg total) by mouth every 6 (six) hours as needed.    No facility-administered encounter medications on file as of 01/22/2021.   Reviewed chart prior to disease state call. Spoke with patient regarding BP  Recent Office  Vitals: BP Readings from Last 3 Encounters:  03/17/20 (!) 150/97  03/06/20 (!) 142/89  02/08/20 (!) 142/90   Pulse Readings from Last 3 Encounters:  03/17/20 63  03/06/20 85  02/08/20 77    Wt Readings from Last 3 Encounters:  03/17/20 273 lb 6.4 oz (124 kg)  03/06/20 273 lb 6.4 oz (124 kg)  02/08/20 272 lb (123.4 kg)     Kidney Function Lab Results  Component Value Date/Time   CREATININE 0.91 03/06/2020 03:38 PM   CREATININE 0.97 12/04/2019 11:30 AM   CREATININE 0.76 05/08/2015 10:59 AM   GFR 76.82 12/04/2019 11:30 AM   GFRNONAA >60 03/06/2020 03:38 PM   GFRAA >60 03/06/2020 03:38 PM    BMP Latest Ref Rng & Units 03/06/2020 12/04/2019 04/24/2019  Glucose 70 - 99 mg/dL 105(H) 116(H) 108(H)  BUN 8 - 23 mg/dL 17 19 17   Creatinine 0.61 - 1.24 mg/dL 0.91 0.97 0.87  BUN/Creat Ratio 10 - 24 - - -  Sodium 135 - 145 mmol/L 138 135 134(L)  Potassium 3.5 - 5.1 mmol/L 4.2 4.1 4.3  Chloride 98 - 111 mmol/L 100 97 96  CO2 22 - 32 mmol/L 28 31 30   Calcium 8.9 - 10.3 mg/dL 9.5 9.6 9.8   I spoke with Mr. Lapoint today and he has been doing well. He has lost some weight and is currently at 266 lbs which he stated is the lowest he's been in a long time.  He is happy to lose the weight and has been taking the initiative to lose more. His motivation is based on being able to better control his BP with weight loss. Even though he hasn't been checking his BP, he stated that he hasn't been feeling any changes that would indicate an increase or decrease in his BP. He remains active and full of energy and even enjoyed a baseball game last week. Overall Mr. Kirkpatrick has no complaints or concerns for his health.  Current antihypertensive regimen:  Losartan-HCTZ 100-25 mg once daily Carvedilol 3.125 mg twice daily Amlodipine 5 mg- take one tab daily   How often are you checking your Blood Pressure?  Patient stated that he has not been using his arm cuff to track his BP at home  Current home BP readings:  None given   What recent interventions/DTPs have been made by any provider to improve Blood Pressure control since last CPP Visit:  No recent interventions  Any recent hospitalizations or ED visits since last visit with CPP? No  What diet changes have been made to improve Blood Pressure Control?  Patient stated he has cut back on carbs like bread and potatoes  What exercise is being done to improve your Blood Pressure Control?  Patient stated he has increased his walking and walks 3-5 miles daily. He has walked a total of 530 miles since the beginning of the year and stated he averages over 1,000 miles annually.  Adherence Review: Is the patient currently on ACE/ARB medication? Yes  Does the patient have >5 day gap between last estimated fill dates? No Carvedilol 3.125 mg - 90 DS last filled 01/12/21 Amlodipine 5 mg- 90 DS last filled 01/12/21  Star Rating Drugs: Losartan-HCTZ 100-25 mg- 90 DS last filled 01/12/21 Rosuvastatin 40 mg- 90 DS last filled 11/13/20  [facility] Sports CPA, CMA

## 2021-01-27 ENCOUNTER — Telehealth: Payer: Self-pay

## 2021-01-27 ENCOUNTER — Telehealth: Payer: Medicare HMO

## 2021-01-27 NOTE — Telephone Encounter (Signed)
  Chronic Care Management  Outreach Note   Name: Shawn Anderson MRN: [id number] DOB: September 21, 1950  Referred by: Marin Olp, MD Reason for referral: Telephone Appointment with [place] Pharmacist, Madelin Rear.   An unsuccessful telephone outreach was attempted today. The patient was referred to the pharmacist for assistance with care management and care coordination.   Telephone appointment with clinical pharmacist today (01/27/2021) at [place], PharmD, [place] Clinical Pharmacist Practitioner  [facility]  [phone]

## 2021-02-24 NOTE — Progress Notes (Signed)
Cardiology Office Note    Date:  03/02/2021   ID:  Shawn Anderson, DOB 24-Feb-1951, MRN [id number]  PCP:  Marin Olp, MD  Cardiologist: Dr. Johnsie Cancel  CC: follow up   History of Present Illness:   70 y.o. CAD/CABG 1997 HTN and Gout. Last myovue 04/29/15 inferior scar no ischemia. Cardiac MRI 05/22/15 EF 42% so no AICD. Had reaction to gadolinium with hives and pruritis so should not have again. HTN Rx coreg and norvasc ? Some neck pain when on ARB Needs to be reminded to take ASA daily. Probable gouty knee arthritis better with allopurinol  Walking trails by [employer] a lot.   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 Johnsie Cancel   Diabetes mellitus without complication [facility])    Foot swelling    Gout    History of kidney stones    Hyperlipidemia    Hypertension    Myocardial infarction ([place]) 1997   Nocturia    Obesity    Patellar tendonitis    Personal history of colonic polyp-adenoma  11/20/2013   Plantar fasciitis     Past Surgical History:  Procedure Laterality Date   [place]   3 VESSELS   HEMICOLECTOMY Right 2007   low grade mucnous appendiceal neoplasm   HERNIA REPAIR     INGUINAL HERNIA REPAIR Right 03/17/2020   Procedure: RIGHT INGUINAL HERNIA REPAIR;  Surgeon: Ralene Ok, MD;  Location: [place];  Service: General;  Laterality: Right;   INSERTION OF MESH Right 03/17/2020   Procedure: INSERTION OF MESH;  Surgeon: Ralene Ok, MD;  Location: [place];  Service: General;  Laterality: Right;   JOINT REPLACEMENT Right 2014   TONSILLECTOMY     TOTAL HIP ARTHROPLASTY Right 06/12/2013   Procedure: RIGHT TOTAL HIP  ARTHROPLASTY ANTERIOR APPROACH;  Surgeon: Mauri Pole, MD;  [facility];  Service: Orthopedics;  Laterality: Right;    Current Medications: Outpatient Medications Prior to Visit  Medication Sig Dispense Refill   amLODipine (NORVASC) 5 MG tablet TAKE ONE TABLET BY MOUTH DAILY 90 tablet 1   amoxicillin (AMOXIL) 500 MG capsule Take 500 mg by mouth 3 (three) times daily. Pt takes for dental appts     aspirin EC 81 MG tablet Take 1 tablet (81 mg total) by mouth daily. 90 tablet 3   carvedilol (COREG) 3.125 MG tablet Take 1 tablet (3.125 mg total) by mouth 2 (two) times daily with a meal. Please keep upcoming appt in August 2022 with Dr. Johnsie Cancel before anymore refills. Thank you 180 tablet 0   losartan-hydrochlorothiazide (HYZAAR) 100-25 MG tablet Take 1 tablet by mouth daily. Please keep upcoming appt in August 2022 with Dr. Johnsie Cancel before anymore refills. Thank you 90 tablet 0   metFORMIN (GLUCOPHAGE) 500 MG tablet TAKE ONE TABLET BY MOUTH DAILY WITH BREAKFAST 90 tablet 1   nitroGLYCERIN (NITROSTAT) 0.4 MG SL tablet Place 1 tablet (0.4 mg total) under the tongue every 5 (five) minutes as needed for chest pain. 25 tablet 3   rosuvastatin (CRESTOR) 40 MG tablet TAKE 1  TABLET BY MOUTH DAILY 30 tablet 5   traMADol (ULTRAM) 50 MG tablet Take 1 tablet (50 mg total) by mouth every 6 (six) hours as needed. 20 tablet 0   indomethacin (INDOCIN) 25 MG capsule indomethacin 25 mg capsule  TAKE TWO CAPSULES PER DAY AFTER MEALS AS NEEDED (Patient not taking: Reported on 03/02/2021) 60 capsule 3   No facility-administered medications prior to visit.     Allergies:   Gadolinium derivatives and Bee venom   Social History   Socioeconomic History   Marital status: Married    Spouse name: Not on file   Number of children: Not on file   Years of education: Not on file   Highest education level: Not on file  Occupational History   Occupation: Product manager: [employer]    Comment: Science writer  Tobacco Use   Smoking status: Never   Smokeless tobacco: Never  Vaping Use   Vaping Use: Never used  Substance and Sexual Activity   Alcohol use: Yes    Alcohol/week: 14.0 - 21.0 standard drinks    Types: 14 - 21 Glasses of wine per week    Comment: 2-3 glasses wine daily   Drug use: No   Sexual activity: Yes  Other Topics Concern   Not on file  Social History Narrative   Married. No kids. 1 stapdaughter 31- no grandkids. 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       Hobbies: enjoys walking (plantar fasciitis inhibits and also patellar tendonitis)   Social Determinants of Health   Financial Resource Strain: Not on file  Food Insecurity: Not on file  Transportation Needs: Not on file  Physical Activity: Not on file  Stress: Not on file  Social Connections: Not on file     Family History:  The patient's family history includes Aneurysm in his father; Heart disease in his mother; Stroke in his mother.      ROS:   Please see the history of present illness.    ROS All other systems reviewed and are negative.   PHYSICAL EXAM:   VS:  BP (!) 142/88   Pulse 73   Ht '6\' 2"'$  (1.88 m)   Wt 121.8 kg   SpO2 98%   BMI 34.49 kg/m    Affect appropriate Overweight white male  HEENT: normal Neck supple with no adenopathy JVP normal no bruits no thyromegaly Lungs clear with no wheezing and good diaphragmatic motion Heart:  S1/S2 no murmur, no rub, gallop or click PMI normal post sternotomy  Abdomen: benighn, BS positve, no tenderness, no AAA no bruit.  No HSM or HJR right inguinal hernia repair  Distal pulses intact with no bruits No edema Neuro non-focal Skin warm and dry No muscular weakness    Wt Readings from Last 3 Encounters:  03/02/21 121.8 kg  03/17/20 124 kg  03/06/20 124 kg      Studies/Labs Reviewed:   EKG:  SR rate  73old IMI no acute changes  03/02/2021   Recent Labs: 03/06/2020: BUN 17; Creatinine, Ser 0.91; Hemoglobin 12.8; Platelets 170; Potassium 4.2; Sodium 138   Lipid Panel    Component Value Date/Time   CHOL 180 12/04/2019 1130   CHOL 181 08/25/2016 0848   TRIG 109.0 12/04/2019 1130   TRIG 269 (HH) 08/01/2006 1039   HDL 54.30 12/04/2019 1130  HDL 49 08/25/2016 0848   CHOLHDL 3 12/04/2019 1130   VLDL 21.8 12/04/2019 1130   LDLCALC 104 (H) 12/04/2019 1130   LDLCALC 92 08/25/2016 0848   LDLDIRECT 81.0 07/20/2018 0848    Additional studies/ records that were reviewed today include:  Myoview 04/2015 Study Highlights   The left ventricular ejection fraction is moderately decreased (30-44%). Nuclear stress EF: 37%. There was no ST segment deviation noted during stress. Defect 1: There is a large defect of severe severity present in the basal inferior, basal inferolateral, mid inferior, mid inferolateral, apical inferior and apical lateral location. Findings consistent with prior myocardial infarction. This is an intermediate risk study.      Cardiac MR 05/2015 IMPRESSION: 1) Moderate LV dilatation with severe inferior wall hypokinesis consistent with previous MI. EF 42% 2) Biatrial enlargement 3) Normal RV 4) No significant ischemic MR 5) Significant Gadolinium reaction. Patient should not receive again see Rx noted above   ASSESSMENT & PLAN:   CAD s/p CABG x3V (1997): Non ischemic myovue 2016 continue medical Rx  No cardiac issues with general anesthesia August 2021 continue statin, ASA and beta blocker   Morbid Obesity: Discussed low carb diet and exercise   HLD: continue statin. Crestor 40 mg   HTN: better with norvasc    Gout: continue indocin and allopurinol try not to escalate diuretic Rx  DM:  Discussed low carb diet.  Target hemoglobin A1c is 6.5 or less.  Continue current medications.A1c 6.3 12/04/19    F/U in a year    Jenkins Rouge

## 2021-02-25 ENCOUNTER — Telehealth: Payer: Self-pay

## 2021-02-25 NOTE — Chronic Care Management (AMB) (Signed)
    Chronic Care Management Pharmacy Assistant   Name: Shawn Anderson  MRN: [id number] DOB: [birth year]  Reason for Encounter: Disease State -General Adherence    Recent office visits:  No visits noted.  Recent consult visits:  No visits noted.   Hospital visits: none.   Medications: Outpatient Encounter Medications as of 02/25/2021  Medication Sig Note   amLODipine (NORVASC) 5 MG tablet TAKE ONE TABLET BY MOUTH DAILY    amoxicillin (AMOXIL) 500 MG capsule Take 500 mg by mouth 3 (three) times daily. Pt takes for dental appts 03/14/2018: Pt only takes prophyllacitcally   aspirin EC 81 MG tablet Take 1 tablet (81 mg total) by mouth daily. (Patient not taking: Reported on 03/04/2020)    carvedilol (COREG) 3.125 MG tablet Take 1 tablet (3.125 mg total) by mouth 2 (two) times daily with a meal. Please keep upcoming appt in August 2022 with Dr. Johnsie Cancel before anymore refills. Thank you    indomethacin (INDOCIN) 25 MG capsule indomethacin 25 mg capsule  TAKE TWO CAPSULES PER DAY AFTER MEALS AS NEEDED (Patient taking differently: Take 50 mg by mouth daily as needed (pain). indomethacin 25 mg capsule  TAKE TWO CAPSULES PER DAY AFTER MEALS AS NEEDED)    losartan-hydrochlorothiazide (HYZAAR) 100-25 MG tablet Take 1 tablet by mouth daily. Please keep upcoming appt in August 2022 with Dr. Johnsie Cancel before anymore refills. Thank you    metFORMIN (GLUCOPHAGE) 500 MG tablet TAKE ONE TABLET BY MOUTH DAILY WITH BREAKFAST    nitroGLYCERIN (NITROSTAT) 0.4 MG SL tablet Place 1 tablet (0.4 mg total) under the tongue every 5 (five) minutes as needed for chest pain.    rosuvastatin (CRESTOR) 40 MG tablet TAKE 1 TABLET BY MOUTH DAILY    traMADol (ULTRAM) 50 MG tablet Take 1 tablet (50 mg total) by mouth every 6 (six) hours as needed.    No facility-administered encounter medications on file as of 02/25/2021.    Have you had any problems recently with your health?   Have you had any problems with your  pharmacy?   What issues or side effects are you having with your medications?   What would you like me to pass along to Madelin Rear, CPP for them to help you with?    What can we do to take care of you better?   Star Rating Drugs: losartan-hydrochlorothiazide (HYZAAR) 100-25 MG - last filled 01/12/21 90 days metFORMIN (GLUCOPHAGE) 500 MG tablet - last filled 02/13/21 90 days  rosuvastatin (CRESTOR) 40 MG tablet - last filled 02/07/21 90 days   Jobe Gibbon, CCMA Clinical Pharmacist Assistant  [phone]  Time Spent: 15 minutes   *Multiple unsuccessful attempts were made to reach patient. Chart review completed.  Jobe Gibbon, [facility] Clinical Pharmacist Assistant  [phone]

## 2021-03-02 ENCOUNTER — Ambulatory Visit: Payer: Medicare HMO | Admitting: Cardiovascular Disease

## 2021-03-02 ENCOUNTER — Other Ambulatory Visit: Payer: Self-pay

## 2021-03-02 ENCOUNTER — Encounter: Payer: Self-pay | Admitting: Cardiovascular Disease

## 2021-03-02 VITALS — BP 142/88 | HR 73 | Ht 74.0 in | Wt 268.6 lb

## 2021-03-02 DIAGNOSIS — I10 Essential (primary) hypertension: Secondary | ICD-10-CM

## 2021-03-02 DIAGNOSIS — Z951 Presence of aortocoronary bypass graft: Secondary | ICD-10-CM

## 2021-03-02 DIAGNOSIS — I25810 Atherosclerosis of coronary artery bypass graft(s) without angina pectoris: Secondary | ICD-10-CM | POA: Diagnosis not present

## 2021-03-02 NOTE — Patient Instructions (Addendum)
Medication Instructions:  *If you need a refill on your cardiac medications before your next appointment, please call your pharmacy*  Lab Work: If you have labs (blood work) drawn today and your tests are completely normal, you will receive your results only by: MyChart Message (if you have MyChart) OR A paper copy in the mail If you have any lab test that is abnormal or we need to change your treatment, we will call you to review the results.  Testing/Procedures: None ordered today.  Follow-Up: At [facility], you and your health needs are our priority.  As part of our continuing mission to provide you with exceptional heart care, we have created designated Provider Care Teams.  These Care Teams include your primary Cardiologist (physician) and Advanced Practice Providers (APPs -  Physician Assistants and Nurse Practitioners) who all work together to provide you with the care you need, when you need it.  We recommend signing up for the patient portal called "MyChart".  Sign up information is provided on this After Visit Summary.  MyChart is used to connect with patients for Virtual Visits (Telemedicine).  Patients are able to view lab/test results, encounter notes, upcoming appointments, etc.  Non-urgent messages can be sent to your provider as well.   To learn more about what you can do with MyChart, go to [email or web address].    Your next appointment:   12 month(s)  The format for your next appointment:   In Person  Provider:   You may see Peter Nishan, MD or one of the following Advanced Practice Providers on your designated Care Team:   Laura Ingold, NP  

## 2021-03-26 ENCOUNTER — Other Ambulatory Visit: Payer: Self-pay

## 2021-03-26 ENCOUNTER — Other Ambulatory Visit (INDEPENDENT_AMBULATORY_CARE_PROVIDER_SITE_OTHER): Payer: Medicare HMO

## 2021-03-26 DIAGNOSIS — I10 Essential (primary) hypertension: Secondary | ICD-10-CM

## 2021-03-26 DIAGNOSIS — Z125 Encounter for screening for malignant neoplasm of prostate: Secondary | ICD-10-CM | POA: Diagnosis not present

## 2021-03-26 DIAGNOSIS — E785 Hyperlipidemia, unspecified: Secondary | ICD-10-CM | POA: Diagnosis not present

## 2021-03-26 DIAGNOSIS — R739 Hyperglycemia, unspecified: Secondary | ICD-10-CM | POA: Diagnosis not present

## 2021-03-26 DIAGNOSIS — Z0000 Encounter for general adult medical examination without abnormal findings: Secondary | ICD-10-CM

## 2021-03-26 LAB — CBC WITH DIFFERENTIAL/PLATELET
Basophils Absolute: 0 10*3/uL (ref 0.0–0.1)
Basophils Relative: 1 % (ref 0.0–3.0)
Eosinophils Absolute: 0.1 10*3/uL (ref 0.0–0.7)
Eosinophils Relative: 3 % (ref 0.0–5.0)
HCT: 38.4 % — ABNORMAL LOW (ref 39.0–52.0)
Hemoglobin: 12.9 g/dL — ABNORMAL LOW (ref 13.0–17.0)
Lymphocytes Relative: 34.1 % (ref 12.0–46.0)
Lymphs Abs: 1.1 10*3/uL (ref 0.7–4.0)
MCHC: 33.6 g/dL (ref 30.0–36.0)
MCV: 98.4 fl (ref 78.0–100.0)
Monocytes Absolute: 0.5 10*3/uL (ref 0.1–1.0)
Monocytes Relative: 13.9 % — ABNORMAL HIGH (ref 3.0–12.0)
Neutro Abs: 1.6 10*3/uL (ref 1.4–7.7)
Neutrophils Relative %: 48 % (ref 43.0–77.0)
Platelets: 151 10*3/uL (ref 150.0–400.0)
RBC: 3.9 Mil/uL — ABNORMAL LOW (ref 4.22–5.81)
RDW: 14.5 % (ref 11.5–15.5)
WBC: 3.3 10*3/uL — ABNORMAL LOW (ref 4.0–10.5)

## 2021-03-26 LAB — LIPID PANEL
Cholesterol: 157 mg/dL (ref 0–200)
HDL: 54.6 mg/dL (ref >39.00)
LDL Cholesterol: 84 mg/dL (ref 0–99)
NonHDL: 102.72
Total CHOL/HDL Ratio: 3
Triglycerides: 93 mg/dL (ref 0.0–149.0)
VLDL: 18.6 mg/dL (ref 0.0–40.0)

## 2021-03-26 LAB — COMPREHENSIVE METABOLIC PANEL
ALT: 23 U/L (ref 0–53)
AST: 25 U/L (ref 0–37)
Albumin: 4.3 g/dL (ref 3.5–5.2)
Alkaline Phosphatase: 48 U/L (ref 39–117)
BUN: 17 mg/dL (ref 6–23)
CO2: 28 mEq/L (ref 19–32)
Calcium: 9.6 mg/dL (ref 8.4–10.5)
Chloride: 98 mEq/L (ref 96–112)
Creatinine, Ser: 0.99 mg/dL (ref 0.40–1.50)
GFR: 77.48 mL/min (ref >60.00)
Glucose, Bld: 96 mg/dL (ref 70–99)
Potassium: 4.1 mEq/L (ref 3.5–5.1)
Sodium: 137 mEq/L (ref 135–145)
Total Bilirubin: 0.7 mg/dL (ref 0.2–1.2)
Total Protein: 7.3 g/dL (ref 6.0–8.3)

## 2021-03-26 LAB — HEMOGLOBIN A1C: Hgb A1c MFr Bld: 6.3 % (ref 4.6–6.5)

## 2021-03-26 LAB — PSA: PSA: 0.66 ng/mL (ref 0.10–4.00)

## 2021-03-31 ENCOUNTER — Other Ambulatory Visit: Payer: Self-pay

## 2021-03-31 ENCOUNTER — Ambulatory Visit (INDEPENDENT_AMBULATORY_CARE_PROVIDER_SITE_OTHER): Payer: Medicare HMO | Admitting: Family Medicine

## 2021-03-31 ENCOUNTER — Encounter: Payer: Self-pay | Admitting: Family Medicine

## 2021-03-31 VITALS — BP 136/86 | HR 77 | Temp 97.6°F | Ht 74.0 in | Wt 266.8 lb

## 2021-03-31 DIAGNOSIS — E785 Hyperlipidemia, unspecified: Secondary | ICD-10-CM

## 2021-03-31 DIAGNOSIS — I2510 Atherosclerotic heart disease of native coronary artery without angina pectoris: Secondary | ICD-10-CM | POA: Diagnosis not present

## 2021-03-31 DIAGNOSIS — I10 Essential (primary) hypertension: Secondary | ICD-10-CM | POA: Diagnosis not present

## 2021-03-31 DIAGNOSIS — Z1211 Encounter for screening for malignant neoplasm of colon: Secondary | ICD-10-CM

## 2021-03-31 DIAGNOSIS — R739 Hyperglycemia, unspecified: Secondary | ICD-10-CM | POA: Diagnosis not present

## 2021-03-31 DIAGNOSIS — Z0000 Encounter for general adult medical examination without abnormal findings: Secondary | ICD-10-CM | POA: Diagnosis not present

## 2021-03-31 MED ORDER — INDOMETHACIN 25 MG PO CAPS: ORAL_CAPSULE | ORAL | 3 refills | Status: DC

## 2021-03-31 NOTE — Patient Instructions (Addendum)
Health Maintenance Due  Topic Date Due   Zoster Vaccines- Shingrix (1 of 2) Please check with your pharmacy to see if they have the shingrix vaccine. If they do- please get this immunization and update [place] by phone call or mychart with dates you receive the vaccine.  Never done   COLONOSCOPY   Please call [facility] contact Address: [address], [place], [place] [address] Phone: [phone]  We placed referral today  11/15/2018   INFLUENZA VACCINE declined today 03/02/2021    Please stop by lab before you go If you have mychart- we will send your results within 3 business days of [place] receiving them.  If you do not have mychart- we will call you about results within 5 business days of [place] receiving them.  *please also note that you will see labs on mychart as soon as they post. I will later go in and write notes on them- will say "notes from Dr. Yong Channel"   Recommended follow up: Return in about 1 year (around 03/31/2022) for physical or sooner if needed.

## 2021-03-31 NOTE — Progress Notes (Signed)
Phone: [phone]   Subjective:  Patient presents today for their annual physical. Chief complaint-noted.   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 Johnsie Cancel   Diabetes mellitus without complication ([place])    Foot swelling    Gout    History of kidney stones    Hyperlipidemia    Hypertension    Myocardial infarction ([place]) 1997   Nocturia    Obesity    Patellar tendonitis    Personal history of colonic polyp-adenoma  11/20/2013   Plantar fasciitis    Patient Active Problem List   Diagnosis Date Noted   CAD (coronary artery disease) 02/21/2007    Priority: High   Hyperglycemia, unspecified 12/04/2019    Priority: Medium   Fatty liver 12/19/2012    Priority: Medium   Gout 09/01/2009    Priority: Medium   Hyperlipidemia 02/21/2007    Priority: Medium   Essential hypertension 02/21/2007    Priority: Medium   Plantar fasciitis     Priority: Low   Left hip pain 11/20/2014    Priority: Low   Dermatitis 11/20/2014    Priority: Low   Personal history of colonic polyp-adenoma  11/20/2013    Priority: Low   Obese 06/13/2013    Priority: Low   S/P right THA, AA 06/12/2013    Priority: Low   Left foot pain 12/19/2012    Priority: Low   Actinic keratosis 08/03/2012    Priority: Low   Kidney stone 02/21/2007    Priority: Low   Past Surgical History:  Procedure Laterality Date   [place]   3 VESSELS   HEMICOLECTOMY Right 2007   low grade mucnous appendiceal neoplasm   HERNIA REPAIR     INGUINAL HERNIA REPAIR Right 03/17/2020   Procedure: RIGHT INGUINAL HERNIA REPAIR;  Surgeon:  Ralene Ok, MD;  Location: [place];  Service: General;  Laterality: Right;   INSERTION OF MESH Right 03/17/2020   Procedure: INSERTION OF MESH;  Surgeon: Ralene Ok, MD;  Location: [place];  Service: General;  Laterality: Right;   JOINT REPLACEMENT Right 2014   TONSILLECTOMY     TOTAL HIP ARTHROPLASTY Right 06/12/2013   Procedure: RIGHT TOTAL HIP ARTHROPLASTY ANTERIOR APPROACH;  Surgeon: Mauri Pole, MD;  [facility];  Service: Orthopedics;  Laterality: Right;    Family History  Problem Relation Age of Onset   Stroke Mother    Heart disease Mother    Aneurysm Father    Colon cancer Neg Hx    Esophageal cancer Neg Hx    Rectal cancer Neg Hx    Stomach cancer Neg Hx     Medications- reviewed and updated Current Outpatient Medications  Medication Sig Dispense Refill   amLODipine (NORVASC) 5 MG tablet TAKE ONE TABLET BY MOUTH DAILY 90 tablet 1   amoxicillin (AMOXIL) 500 MG capsule Take 500 mg by mouth. Pt takes 4 tablets 1 hour prior to dental appointments.     carvedilol (COREG) 3.125 MG tablet Take 1 tablet (3.125 mg total) by mouth 2 (two) times daily with a meal. Please keep  upcoming appt in August 2022 with Dr. Johnsie Cancel before anymore refills. Thank you 180 tablet 0   losartan-hydrochlorothiazide (HYZAAR) 100-25 MG tablet Take 1 tablet by mouth daily. Please keep upcoming appt in August 2022 with Dr. Johnsie Cancel before anymore refills. Thank you 90 tablet 0   metFORMIN (GLUCOPHAGE) 500 MG tablet TAKE ONE TABLET BY MOUTH DAILY WITH BREAKFAST 90 tablet 1   nitroGLYCERIN (NITROSTAT) 0.4 MG SL tablet Place 1 tablet (0.4 mg total) under the tongue every 5 (five) minutes as needed for chest pain. (Patient taking differently: Place 0.4 mg under the tongue every 5 (five) minutes as needed for chest pain. Patient has it but has never had to use it) 25 tablet 3   rosuvastatin (CRESTOR) 40 MG tablet TAKE 1 TABLET BY MOUTH DAILY 30 tablet 5   aspirin EC 81 MG tablet Take 1 tablet (81 mg  total) by mouth daily. (Patient not taking: Reported on 03/31/2021) 90 tablet 3   indomethacin (INDOCIN) 25 MG capsule indomethacin 25 mg capsule  TAKE TWO CAPSULES PER DAY AFTER MEALS AS NEEDED for pain 60 capsule 3   No current facility-administered medications for this visit.    Allergies-reviewed and updated Allergies  Allergen Reactions   Gadolinium Derivatives Shortness Of Breath, Itching, Swelling, Cough and Anaphylaxis    Symptoms include: SOB, wheezing, throat numbness, cough, congestion of nasal cavities, itching of hand and foot, and swelling. Contrast agent given- MultiHance.   Bee Venom     Social History   Social History Narrative   Married. No kids. 1 stapdaughter 31- no grandkids. 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       Hobbies: enjoys walking (plantar fasciitis inhibits and also patellar tendonitis)   Objective  Objective:  BP 136/86   Pulse 77   Temp 97.6 F (36.4 C) (Temporal)   Ht '6\' 2"'$  (1.88 m)   Wt 266 lb 12.8 oz (121 kg)   SpO2 98%   BMI 34.26 kg/m  Gen: NAD, resting comfortably HEENT: Mucous membranes are moist. Oropharynx normal Neck: no thyromegaly CV: RRR no murmurs rubs or gallops Lungs: CTAB no crackles, wheeze, rhonchi Abdomen: soft/nontender/nondistended/normal bowel sounds. No rebound or guarding.  Ext: trace edema Skin: warm, dry Neuro: grossly normal, moves all extremities, PERRLA     Assessment and Plan  70 y.o. male presenting for annual physical.  Health Maintenance counseling: 1. Anticipatory guidance: Patient counseled regarding regular dental exams -q6 months, eye exams - had visit recently,  avoiding smoking and second hand smoke , limiting alcohol to 2 beverages per day - he was drinking 3 per day last year and advised cutting back again today.   2. Risk factor reduction:  Advised patient of need  for regular exercise and diet rich and fruits and vegetables to reduce risk of heart attack and stroke. Exercise- he walks on [address] - walks a lot of miles and does around 3 mph rate- slower if with dog-  averaging 3.5 miles a day. Diet-pretty good diet - down another 7 lbs from last august- congratulated patient.  Wt Readings from Last 3 Encounters:  03/31/21 266 lb 12.8 oz (121 kg)  03/02/21 268 lb 9.6 oz (121.8 kg)  03/17/20 273 lb 6.4 oz (124 kg)  3. Immunizations/screenings/ancillary studies- discussed Shingrix-- consider at pharmacy- and Flu vaccination-prefers to get this at a later date- otherwise up-to-date. Immunization  History  Administered Date(s) Administered   PFIZER(Purple Top)SARS-COV-2 Vaccination 09/06/2019, 09/27/2019, 05/02/2020, 03/24/2021   Pneumococcal Conjugate-13 11/20/2014   Pneumococcal Polysaccharide-23 09/27/2016   Pneumococcal-Unspecified 08/03/2015   Tdap 01/29/2008, 02/26/2013   Zoster, Live 08/22/2013   4. Prostate cancer screening-will trend PSA through age 51   Lab Results  Component Value Date   PSA 0.66 03/26/2021   PSA 0.57 12/04/2019   PSA 0.73 03/14/2018   5. Colon cancer screening - planned to schedule this after hernia repair surgery last year - last on 11/14/2013 with a 5-year repeat planned-referral was placed today again 6. Skin cancer screening- follows with Dr. Allyson Sabal- seen in past. advised regular sunscreen use. Denies worrisome, changing, or new skin lesions.  7. never smoker 8. STD screening - patient declines- opts out as monogamous  Status of chronic or acute concerns   #mild leukopenia- wbc 3.3k  but was 2 days after his booster- recheck next year  #CAD with history of cabg - follows with Dr. Johnsie Cancel #hyperlipidemia S: Medication: aspirin 81 mg (constipation with this) and rosuvastatin '40mg'$  as well as coreg 3.125 mg BID   -zetia very expensive on his plan and he has declined Lab Results  Component Value Date   CHOL 157  03/26/2021   HDL 54.60 03/26/2021   LDLCALC 84 03/26/2021   LDLDIRECT 81.0 07/20/2018   TRIG 93.0 03/26/2021   CHOLHDL 3 03/26/2021   A/P: Lipid panel mildly elevated with LDL above 70-discussed possibly adding Zetia but has been expensive for him in the past-[facility]- he is concerned about liver risk so wants to hold off - CAD stable- continue current meds  #hypertension- slight white coat element S: medication: Carvedilol 3.125 mg twice daily, losartan hydrochlorothiazide 100-25 mg, amlodipine 5 mg Home readings #s:  typically 130s/80s  in past- hasnt recently checked BP Readings from Last 3 Encounters:  03/31/21 136/86  03/02/21 (!) 142/88  03/17/20 (!) 150/97  A/P: Reasonably well-controlled-continue current medication  # Hyperglycemia/insulin resistance/prediabetes-peak A1c 6.6x1. With diagnose of diabetes if had another score of 6.5 or above S: Medication: Metformin 500 mg daily Lab Results  Component Value Date   HGBA1C 6.3 03/26/2021   HGBA1C 6.3 (H) 03/06/2020   HGBA1C 6.3 12/04/2019  A/P: stable on a1c- continue to work on weight loss  #Gout S: Has not wanted to take allopurinol. Hydrochlorothiazide may increase risk. Last uric acid 2014 0 flares in last year on no uric acid lowering medicine Lab Results  Component Value Date   LABURIC 7.7 03/13/2013  A/P:no flares- continue to monitor- checking uric acid likely low benefit if not flaring and wanting to hold off on allopurinol. Has even had some dietary indiscretions in regards to affecting uric acid and still no flares- great news   # right inguinal hernia- sp repair. Did well with surgery.   # very sparing indomethacin for msk Pain such as knees-through orthopedist or [place]. He is aware of slight increased CV risk with this but uses less than once a month  # fatty liver- mild elevations in LFTs in past- better in 2019 with weight loss. 3 glasses of red wine per day- have encouraged  max 2 per day - still above this- encouraged again to cut back Lab Results  Component Value Date   ALT 23 03/26/2021   AST 25 03/26/2021   ALKPHOS 48 03/26/2021   BILITOT 0.7 03/26/2021   Recommended follow up: Return in about 1 year (around 03/31/2022) for  physical or sooner if needed.  Lab/Order associations:NOT fasting   ICD-10-CM   1. Preventative health care  Z00.00     2. Coronary artery disease, unspecified vessel or lesion type, unspecified whether angina present, unspecified whether native or transplanted heart  I25.10     3. Hyperlipidemia, unspecified hyperlipidemia type  E78.5     4. Essential hypertension  I10     5. Hyperglycemia, unspecified  R73.9       Meds ordered this encounter  Medications   indomethacin (INDOCIN) 25 MG capsule    Sig: indomethacin 25 mg capsule  TAKE TWO CAPSULES PER DAY AFTER MEALS AS NEEDED for pain    Dispense:  60 capsule    Refill:  3   I,Harris Phan,acting as a scribe for Garret Reddish, MD.,have documented all relevant documentation on the behalf of Garret Reddish, MD,as directed by  Garret Reddish, MD while in the presence of Garret Reddish, MD.  I, Garret Reddish, MD, have reviewed all documentation for this visit. The documentation on 03/31/21 for the exam, diagnosis, procedures, and orders are all accurate and complete.   Return precautions advised.  Garret Reddish, MD

## 2021-04-01 ENCOUNTER — Encounter: Payer: Self-pay | Admitting: Internal Medicine

## 2021-04-13 ENCOUNTER — Other Ambulatory Visit: Payer: Self-pay | Admitting: Cardiovascular Disease

## 2021-05-08 ENCOUNTER — Telehealth: Payer: Self-pay | Admitting: Pharmacist

## 2021-05-08 NOTE — Chronic Care Management (AMB) (Signed)
    Chronic Care Management Pharmacy Assistant   Name: Shawn Anderson  MRN: [id number] DOB: 07-Dec-1950  Reason for Encounter: General Adherence Call    Recent office visits:  03/31/2021 OV (PCP) Marin Olp, MD; no medication changes indicated.  Recent consult visits:  None  Hospital visits:  None in previous 6 months  Medications: Outpatient Encounter Medications as of 05/08/2021  Medication Sig   amLODipine (NORVASC) 5 MG tablet TAKE ONE TABLET BY MOUTH DAILY   amoxicillin (AMOXIL) 500 MG capsule Take 500 mg by mouth. Pt takes 4 tablets 1 hour prior to dental appointments.   aspirin EC 81 MG tablet Take 1 tablet (81 mg total) by mouth daily. (Patient not taking: Reported on 03/31/2021)   carvedilol (COREG) 3.125 MG tablet TAKE ONE TABLET BY MOUTH TWICE A DAY WITH A MEAL. PLEASE KEEP UPCOMING APPOINTMENT IN AUGUST FOR MORE REFILLS   indomethacin (INDOCIN) 25 MG capsule indomethacin 25 mg capsule  TAKE TWO CAPSULES PER DAY AFTER MEALS AS NEEDED for pain   losartan-hydrochlorothiazide (HYZAAR) 100-25 MG tablet TAKE ONE TABLET BY MOUTH DAILY *PLEASE KEEP UPCOMING APPOINTMENT FOR MORE REFILLS*   metFORMIN (GLUCOPHAGE) 500 MG tablet TAKE ONE TABLET BY MOUTH DAILY WITH BREAKFAST   nitroGLYCERIN (NITROSTAT) 0.4 MG SL tablet Place 1 tablet (0.4 mg total) under the tongue every 5 (five) minutes as needed for chest pain. (Patient taking differently: Place 0.4 mg under the tongue every 5 (five) minutes as needed for chest pain. Patient has it but has never had to use it)   rosuvastatin (CRESTOR) 40 MG tablet TAKE 1 TABLET BY MOUTH DAILY   No facility-administered encounter medications on file as of 05/08/2021.   Patient Questions: Have you had any problems recently with your health? Patient states he has not had any problems recently with his health.  Have you had any problems with your pharmacy? Patient states he has not had any problem  What issues or side effects are you having  with your medications? Patient states he has not had any side effects or issues with any of his medications.  What would you like me to pass along to Shawn Anderson, CPP for him to help you with?  Patient states he does not have anything to pass along at this time.  What can we do to take care of you better? Patient did not have any suggestions, he is happy with his current level of care.   Care Gaps: Medicare Annual Wellness: Due now - declines to schedule Hemoglobin A1C: 6.3% on 03/06/2021 Colonoscopy: Overdue since 11/15/2018 - has colonoscopy scheduled in November 2023.   Future Appointments  Date Time Provider [place]  05/20/2021  8:30 AM [facility] PREVISIT RM 51 [facility]  06/04/2021  9:00 AM Gatha Mayer, MD [facility]  04/02/2022  2:40 PM Marin Olp, MD [facility]    Star Rating Drugs: Losartan/HCTZ 100-25 mg last filled 05/03/2021 90 DS Rosuvastatin 40 mg last filled 05/07/2021 90 DS Metformin 500 mg last filled 02/13/2021 90 DS  April D Calhoun, [place] Pharmacist Assistant [phone]

## 2021-05-12 ENCOUNTER — Telehealth: Payer: Self-pay

## 2021-05-12 NOTE — Telephone Encounter (Signed)
Hi Sheri  Per Dr. Carlean Purl please schedule pt with [facility] as a direct.  Please notify the pt of this in case of scheduling conflicts with him.  Thank you

## 2021-05-12 NOTE — Telephone Encounter (Signed)
Hospital direct is ok

## 2021-05-12 NOTE — Telephone Encounter (Signed)
Hello Dr. Carlean Purl  Per Jenny Reichmann, pt is difficult intubation.  Would you like an OV or direct to [facility]  Thank you

## 2021-05-12 NOTE — Telephone Encounter (Signed)
Patient has been rescheduled to [facility] 12/5 at 10:30.  He will need to arrive at 9:00 am  He will keep his pre-visit as scheduled

## 2021-05-12 NOTE — Telephone Encounter (Signed)
Hello Shawn Anderson:  Please review intubation hx for 2021--looks to be a difficult intubation.  Thank you

## 2021-05-12 NOTE — Telephone Encounter (Signed)
Scheduled at [facility] 12/5.  PV the same; Prep entered.

## 2021-05-14 ENCOUNTER — Other Ambulatory Visit: Payer: Self-pay | Admitting: Family Medicine

## 2021-05-19 ENCOUNTER — Telehealth: Payer: Self-pay | Admitting: Internal Medicine

## 2021-05-19 NOTE — Telephone Encounter (Signed)
Pt called in requesting to cancel his Colonoscopy at [facility] due to the fact that his wife will not be able to be there. Pt requesting that he be scheduled in Jan 2023. Pt notified that the schedule for the hospital is not out yet. Pt encouraged to call back in 1 week to see if schedule is open and the we can reschedule him. Pt verbalized understanding with all questions answered

## 2021-05-19 NOTE — Telephone Encounter (Signed)
Inbound call from patient. Calling to cancel procedure for 12/5 at [facility] because his wife will not be able to be with him. States he would like it 08/2021.

## 2021-05-20 ENCOUNTER — Encounter: Payer: Self-pay | Admitting: Family Medicine

## 2021-06-04 ENCOUNTER — Encounter: Payer: Medicare HMO | Admitting: Internal Medicine

## 2021-06-05 ENCOUNTER — Telehealth: Payer: Self-pay | Admitting: Internal Medicine

## 2021-06-05 ENCOUNTER — Other Ambulatory Visit: Payer: Self-pay

## 2021-06-05 DIAGNOSIS — Z1211 Encounter for screening for malignant neoplasm of colon: Secondary | ICD-10-CM

## 2021-06-05 NOTE — Telephone Encounter (Signed)
Pt scheduled for a previsit Appointment 07/20/2021 @ 2:30  Pt made aware Pt scheduled for a Colonoscopy 08/11/2021 @ 9:00 at [facility] with Dr. Carlean Purl: case # [id number]   Pt made aware Pt verbalized understanding with all questions answered.

## 2021-06-05 NOTE — Telephone Encounter (Signed)
Inbound call from patient stating they are ready to schedule their procedure.

## 2021-07-06 ENCOUNTER — Encounter (HOSPITAL_COMMUNITY): Payer: Self-pay

## 2021-07-06 ENCOUNTER — Ambulatory Visit (HOSPITAL_COMMUNITY): Admit: 2021-07-06 | Payer: Medicare HMO | Admitting: Internal Medicine

## 2021-07-06 SURGERY — COLONOSCOPY WITH PROPOFOL
Anesthesia: Monitor Anesthesia Care

## 2021-07-18 ENCOUNTER — Encounter: Payer: Self-pay | Admitting: Family Medicine

## 2021-07-20 ENCOUNTER — Ambulatory Visit: Payer: Medicare HMO | Admitting: *Deleted

## 2021-07-20 ENCOUNTER — Other Ambulatory Visit: Payer: Self-pay

## 2021-07-20 VITALS — Ht 74.0 in | Wt 266.0 lb

## 2021-07-20 DIAGNOSIS — Z86010 Personal history of colonic polyps: Secondary | ICD-10-CM

## 2021-07-20 NOTE — Progress Notes (Signed)
Patient's pre-visit was done today over the phone with the patient. Name,DOB and address verified. Patient denies any allergies to Eggs and Soy. Patient denies any problems with anesthesia/sedation. Per chart patient is difficult intubation. Patient is not taking any diet pills or blood thinners. No home Oxygen. Prep instructions sent to pt's email per his request. Prep instructions sent to pt's MyChart (if activated).Patient understands to call us back with any questions or concerns. Patient is aware of our care-partner policy and [id number] safety protocol.   EMMI education assigned to the patient for the procedure, sent to [place].   The patient is COVID-19 vaccinated.

## 2021-07-30 ENCOUNTER — Encounter (HOSPITAL_COMMUNITY): Payer: Self-pay | Admitting: Internal Medicine

## 2021-08-10 NOTE — Anesthesia Preprocedure Evaluation (Addendum)
Anesthesia Evaluation  Patient identified by MRN, date of birth, ID band Patient awake    Reviewed: Allergy & Precautions, NPO status , Patient's Chart, lab work & pertinent test results, reviewed documented beta blocker date and time   Airway Mallampati: II  TM Distance: >3 FB Neck ROM: Full    Dental no notable dental hx. (+) Dental Advisory Given   Pulmonary neg pulmonary ROS,    Pulmonary exam normal breath sounds clear to auscultation       Cardiovascular hypertension, Pt. on medications and Pt. on home beta blockers + CAD, + Past MI (1997), + CABG (1997) and +CHF (LVEF 40%)  Normal cardiovascular exam Rhythm:Regular Rate:Normal  Stress test 2016 ? The left ventricular ejection fraction is moderately decreased (30-44%). ? Nuclear stress EF: 37%. ? There was no ST segment deviation noted during stress. ? Defect 1: There is a large defect of severe severity present in the basal inferior, basal inferolateral, mid inferior, mid inferolateral, apical inferior and apical lateral location. ? Findings consistent with prior myocardial infarction. ? This is an intermediate risk study.      Neuro/Psych negative neurological ROS  negative psych ROS   GI/Hepatic negative GI ROS, (+)       alcohol use, 14 drinks/wk    Endo/Other  diabetes, Well Controlled, Type 2, Oral Hypoglycemic Agentsa1c 6.3  Renal/GU   negative genitourinary   Musculoskeletal  (+) Arthritis , Osteoarthritis,    Abdominal   Peds  Hematology negative hematology ROS (+)   Anesthesia Other Findings   Reproductive/Obstetrics negative OB ROS                            Anesthesia Physical Anesthesia Plan  ASA: 3  Anesthesia Plan: MAC   Post-op Pain Management:    Induction:   PONV Risk Score and Plan: 2 and Propofol infusion and TIVA  Airway Management Planned: Natural Airway and Simple Face Mask  Additional  Equipment: None  Intra-op Plan:   Post-operative Plan:   Informed Consent: I have reviewed the patients History and Physical, chart, labs and discussed the procedure including the risks, benefits and alternatives for the proposed anesthesia with the patient or authorized representative who has indicated his/her understanding and acceptance.     Dental advisory given  Plan Discussed with: CRNA  Anesthesia Plan Comments:        Anesthesia Quick Evaluation

## 2021-08-11 ENCOUNTER — Other Ambulatory Visit: Payer: Self-pay | Admitting: Cardiovascular Disease

## 2021-08-11 ENCOUNTER — Ambulatory Visit (HOSPITAL_COMMUNITY)
Admission: RE | Admit: 2021-08-11 | Discharge: 2021-08-11 | Disposition: A | Discharge status: Home / Self Care | Payer: Medicare HMO | Attending: Internal Medicine | Admitting: Internal Medicine

## 2021-08-11 ENCOUNTER — Ambulatory Visit (HOSPITAL_COMMUNITY): Payer: Medicare HMO | Admitting: Anesthesiology

## 2021-08-11 ENCOUNTER — Encounter (HOSPITAL_COMMUNITY)
Admission: RE | Disposition: A | Discharge status: Home / Self Care | Payer: Self-pay | Source: Home / Self Care | Attending: Internal Medicine

## 2021-08-11 ENCOUNTER — Encounter (HOSPITAL_COMMUNITY): Payer: Self-pay | Admitting: Internal Medicine

## 2021-08-11 DIAGNOSIS — Z86010 Personal history of colonic polyps: Secondary | ICD-10-CM | POA: Diagnosis not present

## 2021-08-11 DIAGNOSIS — D124 Benign neoplasm of descending colon: Secondary | ICD-10-CM | POA: Diagnosis present

## 2021-08-11 DIAGNOSIS — Z1211 Encounter for screening for malignant neoplasm of colon: Secondary | ICD-10-CM | POA: Insufficient documentation

## 2021-08-11 DIAGNOSIS — I110 Hypertensive heart disease with heart failure: Secondary | ICD-10-CM | POA: Diagnosis not present

## 2021-08-11 DIAGNOSIS — Z980 Intestinal bypass and anastomosis status: Secondary | ICD-10-CM | POA: Insufficient documentation

## 2021-08-11 DIAGNOSIS — E119 Type 2 diabetes mellitus without complications: Secondary | ICD-10-CM | POA: Diagnosis not present

## 2021-08-11 DIAGNOSIS — K5730 Diverticulosis of large intestine without perforation or abscess without bleeding: Secondary | ICD-10-CM | POA: Insufficient documentation

## 2021-08-11 DIAGNOSIS — I2510 Atherosclerotic heart disease of native coronary artery without angina pectoris: Secondary | ICD-10-CM | POA: Diagnosis not present

## 2021-08-11 DIAGNOSIS — K635 Polyp of colon: Secondary | ICD-10-CM | POA: Diagnosis not present

## 2021-08-11 DIAGNOSIS — I509 Heart failure, unspecified: Secondary | ICD-10-CM | POA: Diagnosis not present

## 2021-08-11 HISTORY — PX: POLYPECTOMY: SHX5525

## 2021-08-11 HISTORY — PX: COLONOSCOPY WITH PROPOFOL: SHX5780

## 2021-08-11 LAB — GLUCOSE, CAPILLARY: Glucose-Capillary: 117 mg/dL — ABNORMAL HIGH (ref 70–99)

## 2021-08-11 SURGERY — COLONOSCOPY WITH PROPOFOL
Anesthesia: Monitor Anesthesia Care

## 2021-08-11 MED ORDER — PROPOFOL 500 MG/50ML IV EMUL
INTRAVENOUS | Status: DC | PRN
  Administered 2021-08-11: 100 ug/kg/min via INTRAVENOUS

## 2021-08-11 MED ORDER — SODIUM CHLORIDE 0.9 % IV SOLN: INTRAVENOUS | Status: DC

## 2021-08-11 MED ORDER — PROPOFOL 10 MG/ML IV BOLUS
INTRAVENOUS | Status: DC | PRN
Start: 2021-08-11 — End: 2021-08-11
  Administered 2021-08-11 (×2): 30 mg via INTRAVENOUS
  Administered 2021-08-11 (×2): 50 mg via INTRAVENOUS

## 2021-08-11 MED ORDER — LACTATED RINGERS IV SOLN
INTRAVENOUS | Status: DC | PRN
Start: 2021-08-11 — End: 2021-08-11

## 2021-08-11 SURGICAL SUPPLY — 22 items

## 2021-08-11 NOTE — Anesthesia Postprocedure Evaluation (Signed)
Anesthesia Post Note  Patient: Shawn Anderson  Procedure(s) Performed: COLONOSCOPY WITH PROPOFOL POLYPECTOMY     Patient location during evaluation: PACU Anesthesia Type: MAC Level of consciousness: awake and alert Pain management: pain level controlled Vital Signs Assessment: post-procedure vital signs reviewed and stable Respiratory status: spontaneous breathing, nonlabored ventilation and respiratory function stable Cardiovascular status: blood pressure returned to baseline and stable Postop Assessment: no apparent nausea or vomiting Anesthetic complications: no   No notable events documented.  Last Vitals:  Vitals:   08/11/21 0951 08/11/21 1000  BP: (!) 159/93 138/86  Pulse: 64 64  Resp: 11 11  Temp:    SpO2: 100% 100%    Last Pain:  Vitals:   08/11/21 0951  TempSrc:   PainSc: 0-No pain                 Pervis Hocking

## 2021-08-11 NOTE — Anesthesia Procedure Notes (Signed)
Procedure Name: MAC Date/Time: 08/11/2021 9:09 AM Performed by: Deliah Boston, CRNA Pre-anesthesia Checklist: Patient identified, Emergency Drugs available, Suction available and Patient being monitored Patient Re-evaluated:Patient Re-evaluated prior to induction Oxygen Delivery Method: Simple face mask Preoxygenation: Pre-oxygenation with 100% oxygen Induction Type: IV induction Placement Confirmation: positive ETCO2 and breath sounds checked- equal and bilateral

## 2021-08-11 NOTE — Transfer of Care (Signed)
Immediate Anesthesia Transfer of Care Note  Patient: Shawn Anderson  Procedure(s) Performed: Procedure(s): COLONOSCOPY WITH PROPOFOL (N/A) POLYPECTOMY  Patient Location: PACU  Anesthesia Type:MAC  Level of Consciousness: Patient easily awoken, sedated, comfortable, cooperative, following commands, responds to stimulation.   Airway & Oxygen Therapy: Patient spontaneously breathing, ventilating well, oxygen via simple oxygen mask.  Post-op Assessment: Report given to PACU RN, vital signs reviewed and stable, moving all extremities.   Post vital signs: Reviewed and stable.  Complications: No apparent anesthesia complications Last Vitals:  Vitals Value Taken Time  BP    Temp    Pulse 68 08/11/21 0935  Resp 15 08/11/21 0935  SpO2 100 % 08/11/21 0935  Vitals shown include unvalidated device data.  Last Pain:  Vitals:   08/11/21 0756  TempSrc: Temporal  PainSc: 0-No pain         Complications: No notable events documented.

## 2021-08-11 NOTE — Discharge Instructions (Signed)
I found and removed 2 tiny polyps. I will let you know pathology results and when/if to have another routine colonoscopy by mail and/or My Chart.  I appreciate the opportunity to care for you. Gatha Mayer, MD, FACG  YOU HAD AN ENDOSCOPIC PROCEDURE TODAY: Refer to the procedure report and other information in the discharge instructions given to you for any specific questions about what was found during the examination. If this information does not answer your questions, please call Dr. Celesta Aver office at [phone] to clarify.   YOU SHOULD EXPECT: Some feelings of bloating in the abdomen. Passage of more gas than usual. Walking can help get rid of the air that was put into your GI tract during the procedure and reduce the bloating. If you had a lower endoscopy (such as a colonoscopy or flexible sigmoidoscopy) you may notice spotting of blood in your stool or on the toilet paper. Some abdominal soreness may be present for a day or two, also.  DIET: Your first meal following the procedure should be a light meal and then it is ok to progress to your normal diet. A half-sandwich or bowl of soup is an example of a good first meal. Heavy or fried foods are harder to digest and may make you feel nauseous or bloated. Drink plenty of fluids but you should avoid alcoholic beverages for 24 hours.   ACTIVITY: Your care partner should take you home directly after the procedure. You should plan to take it easy, moving slowly for the rest of the day. You can resume normal activity the day after the procedure however YOU SHOULD NOT DRIVE, use power tools, machinery or perform tasks that involve climbing or major physical exertion for 24 hours (because of the sedation medicines used during the test).   SYMPTOMS TO REPORT IMMEDIATELY: A gastroenterologist can be reached at any hour. Please call [phone]  for any of the following symptoms:  Following lower endoscopy (colonoscopy, flexible  sigmoidoscopy) Excessive amounts of blood in the stool  Significant tenderness, worsening of abdominal pains  Swelling of the abdomen that is new, acute  Fever of 100 or higher   FOLLOW UP:  If any biopsies were taken you will be contacted by phone or by letter within the next 1-3 weeks. Call [phone]  if you have not heard about the biopsies in 3 weeks.  Please also call with any specific questions about appointments or follow up tests.

## 2021-08-11 NOTE — H&P (Signed)
[facility] History and Physical   Primary Care Physician:  Marin Olp, MD   Reason for Procedure:   Hx colon polyps  Plan:    colonoscopy     HPI: Shawn Anderson is a 71 y.o. male here for surveillance colonoscopy exam. Hx difficult airway so procedure at hospital.   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 Johnsie Cancel   Diabetes mellitus without complication [facility])    Foot swelling    Gout    History of kidney stones    Hyperlipidemia    Hypertension    Myocardial infarction ([place]) 1997   Nocturia    Obesity    Patellar tendonitis    Personal history of colonic polyp-adenoma  11/20/2013   Plantar fasciitis     Past Surgical History:  Procedure Laterality Date   APPENDECTOMY     COLONOSCOPY WITH PROPOFOL  11/14/2013   Dr.Tisa Weisel   CORONARY ARTERY BYPASS GRAFT  1997   3 VESSELS   HEMICOLECTOMY Right 2007   low grade mucnous appendiceal neoplasm   HERNIA REPAIR     INGUINAL HERNIA REPAIR Right 03/17/2020   Procedure: RIGHT INGUINAL HERNIA REPAIR;  Surgeon: Ralene Ok, MD;  Location: [facility];  Service: General;  Laterality: Right;   INSERTION OF MESH Right 03/17/2020   Procedure: INSERTION OF MESH;  Surgeon: Ralene Ok, MD;  Location: [facility];  Service: General;  Laterality: Right;   JOINT REPLACEMENT Right 2014   TONSILLECTOMY     TOTAL HIP ARTHROPLASTY Right 06/12/2013   Procedure: RIGHT TOTAL HIP ARTHROPLASTY ANTERIOR APPROACH;  Surgeon: Mauri Pole, MD;  Location: [facility];  Service: Orthopedics;  Laterality: Right;    Prior to Admission medications   Medication Sig Start Date End Date Taking? Authorizing Provider  amLODipine (NORVASC) 5 MG tablet TAKE ONE TABLET BY MOUTH DAILY 11/13/20  Yes Josue Hector, MD  carvedilol (COREG) 3.125 MG tablet TAKE ONE TABLET BY MOUTH TWICE A DAY WITH A MEAL. PLEASE KEEP UPCOMING  APPOINTMENT IN AUGUST FOR MORE REFILLS 04/14/21  Yes Josue Hector, MD  losartan-hydrochlorothiazide (HYZAAR) 100-25 MG tablet TAKE ONE TABLET BY MOUTH DAILY *PLEASE KEEP UPCOMING APPOINTMENT FOR MORE REFILLS* 04/14/21  Yes Josue Hector, MD  metFORMIN (GLUCOPHAGE) 500 MG tablet TAKE ONE TABLET BY MOUTH DAILY WITH BREAKFAST 05/18/21  Yes Marin Olp, MD  rosuvastatin (CRESTOR) 40 MG tablet TAKE 1 TABLET BY MOUTH DAILY 11/13/20  Yes Marin Olp, MD  amoxicillin (AMOXIL) 500 MG capsule Take 500 mg by mouth. Pt takes 4 tablets 1 hour prior to dental appointments.    [provider]  aspirin EC 81 MG tablet Take 1 tablet (81 mg total) by mouth daily. 02/08/20   Josue Hector, MD  indomethacin (INDOCIN) 25 MG capsule indomethacin 25 mg capsule  TAKE TWO CAPSULES PER DAY AFTER MEALS AS NEEDED for pain 03/31/21   Marin Olp, MD  nitroGLYCERIN (NITROSTAT) 0.4 MG SL tablet Place 1 tablet (0.4 mg total) under the tongue every 5 (five) minutes as needed for chest pain. 02/08/20   Josue Hector, MD    Current Facility-Administered Medications  Medication Dose Route Frequency Provider Last Rate Last Admin   0.9 %  sodium chloride infusion   Intravenous Continuous Gatha Mayer, MD        Allergies as  of 06/05/2021 - Review Complete 03/31/2021  Allergen Reaction Noted   Gadolinium derivatives Shortness Of Breath, Itching, Swelling, Cough, and Anaphylaxis 05/22/2015   Bee venom  03/14/2018    Family History  Problem Relation Age of Onset   Stroke Mother    Heart disease Mother    Aneurysm Father    Colon cancer Neg Hx    Esophageal cancer Neg Hx    Rectal cancer Neg Hx    Stomach cancer Neg Hx     Social History   Socioeconomic History   Marital status: Married    Spouse name: Not on file   Number of children: Not on file   Years of education: Not on file   Highest education level: Not on file  Occupational History   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 No kids. 1 stapdaughter 31- no grandkids. 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       Hobbies: enjoys walking (plantar fasciitis inhibits and also patellar tendonitis)    Review of Systems:  All other review of systems negative except as mentioned in the HPI.  Physical Exam: Vital signs BP (!) 159/92    Pulse 70    Temp 98.3 F (36.8 C) (Temporal)    Resp 11    Ht 6\' 2"  (1.88 m)    Wt 120 kg    SpO2 100%    BMI 33.97 kg/m   General:   Alert,  Well-developed, well-nourished, pleasant and cooperative in NAD Lungs:  Clear throughout to auscultation.   Heart:  Regular rate and rhythm; no murmurs, clicks, rubs,  or gallops. Abdomen:  Soft, nontender and nondistended. Normal bowel sounds.   Neuro/Psych:  Alert and cooperative. Normal mood and affect. A and O x 3   @Britnay Magnussen  Simonne Maffucci, MD, [facility] [phone] (pager) 08/11/2021 8:40 AM@

## 2021-08-11 NOTE — Op Note (Signed)
[facility] Patient Name: Shawn Anderson Procedure Date: 08/11/2021 MRN: [id number] Attending MD: Gatha Mayer , MD Date of Birth: 09-Jun-1951 CSN: [id number] Age: 71 Admit Type: Outpatient Procedure:                Colonoscopy Indications:              Surveillance: Personal history of adenomatous                            polyps on last colonoscopy > 5 years ago, Last                            colonoscopy: 2015 Providers:                Gatha Mayer, MD, Dulcy Fanny, Benetta Spar, Technician Referring MD:              Medicines:                Propofol per Anesthesia, Monitored Anesthesia Care Complications:            No immediate complications. Estimated Blood Loss:      Procedure:                Pre-Anesthesia Assessment:                           - Prior to the procedure, a History and Physical                            was performed, and patient medications and                            allergies were reviewed. The patient's tolerance of                            previous anesthesia was also reviewed. The risks                            and benefits of the procedure and the sedation                            options and risks were discussed with the patient.                            All questions were answered, and informed consent                            was obtained. Prior Anticoagulants: The patient has                            taken no previous anticoagulant or antiplatelet                            agents. ASA Grade Assessment:  III - A patient with                            severe systemic disease. After reviewing the risks                            and benefits, the patient was deemed in                            satisfactory condition to undergo the procedure.                           After obtaining informed consent, the colonoscope                            was passed under direct vision.  Throughout the                            procedure, the patient's blood pressure, pulse, and                            oxygen saturations were monitored continuously. The                            CF-HQ190L ([id number]) Olympus colonoscope was                            introduced through the anus and advanced to the the                            ileocolonic anastomosis. The colonoscopy was                            performed without difficulty. The patient tolerated                            the procedure well. The quality of the bowel                            preparation was good. The rectum and Ileocolonic                            anastomsis areas were photographed. Scope In: 2:22:97 AM Scope Out: 9:28:12 AM Scope Withdrawal Time: 0 hours 12 minutes 49 seconds  Total Procedure Duration: 0 hours 21 minutes 58 seconds  Findings:      The perianal and digital rectal examinations were normal.      Two sessile polyps were found in the descending colon. The polyps were       diminutive in size. These polyps were removed with a cold snare.       Resection and retrieval were complete. Verification of patient       identification for the specimen was done. Estimated blood loss was       minimal.      Multiple diverticula were found in the sigmoid colon.  There was evidence of a prior end-to-side colo-colonic anastomosis in       the ascending colon. This was patent and was characterized by healthy       appearing mucosa.      The exam was otherwise without abnormality on direct and retroflexion       views. Impression:               - Two diminutive polyps in the descending colon,                            removed with a cold snare. Resected and retrieved.                           - Diverticulosis in the sigmoid colon.                           - Patent end-to-side colo-colonic anastomosis,                            characterized by healthy appearing mucosa.                            - The examination was otherwise normal on direct                            and retroflexion views.                           - Personal history of colonic polyps. 7 mm adenoma                            2015, prior benign mucinous neoplasm appendix                            (rupture and dx at appendectomy) Moderate Sedation:      Not Applicable - Patient had care per Anesthesia. Recommendation:           - Patient has a contact number available for                            emergencies. The signs and symptoms of potential                            delayed complications were discussed with the                            patient. Return to normal activities tomorrow.                            Written discharge instructions were provided to the                            patient.                           - Resume previous  diet.                           - Continue present medications.                           - No recommendation at this time regarding repeat                            colonoscopy due to age.                           - Await pathology results. Procedure Code(s):        --- Professional ---                           [phone], Colonoscopy, flexible; with removal of                            tumor(s), polyp(s), or other lesion(s) by snare                            technique Diagnosis Code(s):        --- Professional ---                           Z86.010, Personal history of colonic polyps                           K63.5, Polyp of colon                           Z98.0, Intestinal bypass and anastomosis status                           K57.30, Diverticulosis of large intestine without                            perforation or abscess without bleeding CPT copyright 2019 American Medical Association. All rights reserved. The codes documented in this report are preliminary and upon coder review may  be revised to meet current compliance requirements. Gatha Mayer,  MD 08/11/2021 9:38:53 AM This report has been signed electronically. Number of Addenda: 0

## 2021-08-12 LAB — SURGICAL PATHOLOGY

## 2021-08-13 ENCOUNTER — Encounter: Payer: Self-pay | Admitting: Internal Medicine

## 2021-08-13 DIAGNOSIS — E119 Type 2 diabetes mellitus without complications: Secondary | ICD-10-CM | POA: Insufficient documentation

## 2021-08-14 ENCOUNTER — Encounter (HOSPITAL_COMMUNITY): Payer: Self-pay | Admitting: Internal Medicine

## 2021-09-22 ENCOUNTER — Telehealth: Payer: Self-pay | Admitting: Family Medicine

## 2021-09-22 NOTE — Telephone Encounter (Signed)
Copied from [employer] [id number]. Topic: Medicare AWV >> Sep 22, 2021  9:46 AM Harris-Coley, Hannah Beat wrote: Reason for CRM: Left message for patient to schedule Annual Wellness Visit.  Please schedule with Nurse Health Advisor Charlott Rakes, RN at [facility].  Please call [phone] ask for [facility]

## 2021-11-03 ENCOUNTER — Other Ambulatory Visit: Payer: Self-pay | Admitting: Family Medicine

## 2021-11-12 ENCOUNTER — Other Ambulatory Visit: Payer: Self-pay | Admitting: Family Medicine

## 2022-02-05 ENCOUNTER — Other Ambulatory Visit: Payer: Self-pay | Admitting: Cardiovascular Disease

## 2022-03-03 ENCOUNTER — Other Ambulatory Visit: Payer: Self-pay | Admitting: Cardiovascular Disease

## 2022-03-17 ENCOUNTER — Other Ambulatory Visit: Payer: Self-pay | Admitting: Cardiovascular Disease

## 2022-03-29 ENCOUNTER — Other Ambulatory Visit: Payer: Self-pay | Admitting: Cardiovascular Disease

## 2022-04-02 ENCOUNTER — Encounter: Payer: Medicare HMO | Admitting: Family Medicine

## 2022-04-26 ENCOUNTER — Other Ambulatory Visit: Payer: Self-pay | Admitting: Cardiovascular Disease

## 2022-04-26 ENCOUNTER — Encounter: Payer: Self-pay | Admitting: *Deleted

## 2022-04-28 ENCOUNTER — Other Ambulatory Visit: Payer: Self-pay | Admitting: Cardiovascular Disease

## 2022-05-08 ENCOUNTER — Other Ambulatory Visit: Payer: Self-pay | Admitting: Family Medicine

## 2022-05-12 ENCOUNTER — Other Ambulatory Visit: Payer: Self-pay | Admitting: Cardiovascular Disease

## 2022-05-24 ENCOUNTER — Other Ambulatory Visit: Payer: Self-pay | Admitting: Cardiovascular Disease

## 2022-05-28 ENCOUNTER — Other Ambulatory Visit: Payer: Self-pay | Admitting: Cardiovascular Disease

## 2022-06-06 ENCOUNTER — Other Ambulatory Visit: Payer: Self-pay | Admitting: Cardiovascular Disease

## 2022-06-09 ENCOUNTER — Other Ambulatory Visit: Payer: Self-pay | Admitting: Cardiovascular Disease

## 2022-06-21 ENCOUNTER — Other Ambulatory Visit: Payer: Self-pay | Admitting: Cardiovascular Disease

## 2022-07-02 ENCOUNTER — Ambulatory Visit (INDEPENDENT_AMBULATORY_CARE_PROVIDER_SITE_OTHER): Payer: Medicare HMO

## 2022-07-02 VITALS — Wt 264.0 lb

## 2022-07-02 DIAGNOSIS — Z0000 Encounter for general adult medical examination without abnormal findings: Secondary | ICD-10-CM

## 2022-07-02 NOTE — Patient Instructions (Signed)
Shawn Anderson , Thank you for taking time to come for your Medicare Wellness Visit. I appreciate your ongoing commitment to your health goals. Please review the following plan we discussed and let me know if I can assist you in the future.   These are the goals we discussed:  Goals      Track and Manage My Blood Pressure-Hypertension     Timeframe:  Long-Range Goal Priority:  High Start Date:                             Expected End Date:                       Follow Up Date July/2022  - check blood pressure 3 times per week    Why is this important?   You won't feel high blood pressure, but it can still hurt your blood vessels.  High blood pressure can cause heart or kidney problems. It can also cause a stroke.  Making lifestyle changes like losing a little weight or eating less salt will help.  Checking your blood pressure at home and at different times of the day can help to control blood pressure.  If the doctor prescribes medicine remember to take it the way the doctor ordered.  Call the office if you cannot afford the medicine or if there are questions about it.     Notes:         This is a list of the screening recommended for you and due dates:  Health Maintenance  Topic Date Due   Complete foot exam   Never done   Eye exam for diabetics  Never done   Yearly kidney health urinalysis for diabetes  Never done   Zoster (Shingles) Vaccine (1 of 2) Never done   Medicare Annual Wellness Visit  12/03/2020   Hemoglobin A1C  09/26/2021   Flu Shot  03/02/2022   Yearly kidney function blood test for diabetes  03/26/2022   COVID-19 Vaccine (6 - 2023-24 season) 04/02/2022   DTaP/Tdap/Td vaccine (3 - Td or Tdap) 02/27/2023   Colon Cancer Screening  08/11/2026   Pneumonia Vaccine  Completed   Hepatitis C Screening: USPSTF Recommendation to screen - Ages 42-79 yo.  Completed   HPV Vaccine  Aged Out    Advanced directives: Please bring a copy of your health care power of attorney  and living will to the office at your convenience.  Conditions/risks identified: lose weight to 250 lbs   Next appointment: Follow up in one year for your annual wellness visit.   Preventive Care 80 Years and Older, Male  Preventive care refers to lifestyle choices and visits with your health care provider that can promote health and wellness. What does preventive care include? A yearly physical exam. This is also called an annual well check. Dental exams once or twice a year. Routine eye exams. Ask your health care provider how often you should have your eyes checked. Personal lifestyle choices, including: Daily care of your teeth and gums. Regular physical activity. Eating a healthy diet. Avoiding tobacco and drug use. Limiting alcohol use. Practicing safe sex. Taking low doses of aspirin every day. Taking vitamin and mineral supplements as recommended by your health care provider. What happens during an annual well check? The services and screenings done by your health care provider during your annual well check will depend on your age, overall health,  lifestyle risk factors, and family history of disease. Counseling  Your health care provider may ask you questions about your: Alcohol use. Tobacco use. Drug use. Emotional well-being. Home and relationship well-being. Sexual activity. Eating habits. History of falls. Memory and ability to understand (cognition). Work and work Statistician. Screening  You may have the following tests or measurements: Height, weight, and BMI. Blood pressure. Lipid and cholesterol levels. These may be checked every 5 years, or more frequently if you are over 16 years old. Skin check. Lung cancer screening. You may have this screening every year starting at age 6 if you have a 30-pack-year history of smoking and currently smoke or have quit within the past 15 years. Fecal occult blood test (FOBT) of the stool. You may have this test every  year starting at age 44. Flexible sigmoidoscopy or colonoscopy. You may have a sigmoidoscopy every 5 years or a colonoscopy every 10 years starting at age 33. Prostate cancer screening. Recommendations will vary depending on your family history and other risks. Hepatitis C blood test. Hepatitis B blood test. Sexually transmitted disease (STD) testing. Diabetes screening. This is done by checking your blood sugar (glucose) after you have not eaten for a while (fasting). You may have this done every 1-3 years. Abdominal aortic aneurysm (AAA) screening. You may need this if you are a current or former smoker. Osteoporosis. You may be screened starting at age 28 if you are at high risk. Talk with your health care provider about your test results, treatment options, and if necessary, the need for more tests. Vaccines  Your health care provider may recommend certain vaccines, such as: Influenza vaccine. This is recommended every year. Tetanus, diphtheria, and acellular pertussis (Tdap, Td) vaccine. You may need a Td booster every 10 years. Zoster vaccine. You may need this after age 37. Pneumococcal 13-valent conjugate (PCV13) vaccine. One dose is recommended after age 60. Pneumococcal polysaccharide (PPSV23) vaccine. One dose is recommended after age 9. Talk to your health care provider about which screenings and vaccines you need and how often you need them. This information is not intended to replace advice given to you by your health care provider. Make sure you discuss any questions you have with your health care provider. Document Released: 08/15/2015 Document Revised: 04/07/2016 Document Reviewed: 05/20/2015 Elsevier Interactive Patient Education  2017 [place] Prevention in the Home Falls can cause injuries. They can happen to people of all ages. There are many things you can do to make your home safe and to help prevent falls. What can I do on the outside of my  home? Regularly fix the edges of walkways and driveways and fix any cracks. Remove anything that might make you trip as you walk through a door, such as a raised step or threshold. Trim any bushes or trees on the path to your home. Use bright outdoor lighting. Clear any walking paths of anything that might make someone trip, such as rocks or tools. Regularly check to see if handrails are loose or broken. Make sure that both sides of any steps have handrails. Any raised decks and porches should have guardrails on the edges. Have any leaves, snow, or ice cleared regularly. Use sand or salt on walking paths during winter. Clean up any spills in your garage right away. This includes oil or grease spills. What can I do in the bathroom? Use night lights. Install grab bars by the toilet and in the tub and shower. Do not use  towel bars as grab bars. Use non-skid mats or decals in the tub or shower. If you need to sit down in the shower, use a plastic, non-slip stool. Keep the floor dry. Clean up any water that spills on the floor as soon as it happens. Remove soap buildup in the tub or shower regularly. Attach bath mats securely with double-sided non-slip rug tape. Do not have throw rugs and other things on the floor that can make you trip. What can I do in the bedroom? Use night lights. Make sure that you have a light by your bed that is easy to reach. Do not use any sheets or blankets that are too big for your bed. They should not hang down onto the floor. Have a firm chair that has side arms. You can use this for support while you get dressed. Do not have throw rugs and other things on the floor that can make you trip. What can I do in the kitchen? Clean up any spills right away. Avoid walking on wet floors. Keep items that you use a lot in easy-to-reach places. If you need to reach something above you, use a strong step stool that has a grab bar. Keep electrical cords out of the way. Do  not use floor polish or wax that makes floors slippery. If you must use wax, use non-skid floor wax. Do not have throw rugs and other things on the floor that can make you trip. What can I do with my stairs? Do not leave any items on the stairs. Make sure that there are handrails on both sides of the stairs and use them. Fix handrails that are broken or loose. Make sure that handrails are as long as the stairways. Check any carpeting to make sure that it is firmly attached to the stairs. Fix any carpet that is loose or worn. Avoid having throw rugs at the top or bottom of the stairs. If you do have throw rugs, attach them to the floor with carpet tape. Make sure that you have a light switch at the top of the stairs and the bottom of the stairs. If you do not have them, ask someone to add them for you. What else can I do to help prevent falls? Wear shoes that: Do not have high heels. Have rubber bottoms. Are comfortable and fit you well. Are closed at the toe. Do not wear sandals. If you use a stepladder: Make sure that it is fully opened. Do not climb a closed stepladder. Make sure that both sides of the stepladder are locked into place. Ask someone to hold it for you, if possible. Clearly mark and make sure that you can see: Any grab bars or handrails. First and last steps. Where the edge of each step is. Use tools that help you move around (mobility aids) if they are needed. These include: Canes. Walkers. Scooters. Crutches. Turn on the lights when you go into a dark area. Replace any light bulbs as soon as they burn out. Set up your furniture so you have a clear path. Avoid moving your furniture around. If any of your floors are uneven, fix them. If there are any pets around you, be aware of where they are. Review your medicines with your doctor. Some medicines can make you feel dizzy. This can increase your chance of falling. Ask your doctor what other things that you can do to  help prevent falls. This information is not intended to replace advice given  to you by your health care provider. Make sure you discuss any questions you have with your health care provider. Document Released: 05/15/2009 Document Revised: 12/25/2015 Document Reviewed: 08/23/2014 Elsevier Interactive Patient Education  2017 [employer].

## 2022-07-02 NOTE — Progress Notes (Signed)
I connected with  Leron Croak on 07/02/22 by a audio enabled telemedicine application and verified that I am speaking with the correct person using two identifiers.  Patient Location: Home  Provider Location: Office/Clinic  I discussed the limitations of evaluation and management by telemedicine. The patient expressed understanding and agreed to proceed.   Subjective:   Shawn Anderson is a 71 y.o. male who presents for Medicare Annual/Subsequent preventive examination.  Review of Systems     Cardiac Risk Factors include: advanced age (>7mn, >>83women);dyslipidemia;diabetes mellitus;hypertension;male gender;obesity (BMI >30kg/m2)     Objective:    Today's Vitals   07/02/22 1302  Weight: 264 lb (119.7 kg)   Body mass index is 33.9 kg/m.     07/02/2022    1:10 PM 08/11/2021    7:47 AM 03/06/2020    2:53 PM 06/12/2013    3:50 PM 06/05/2013    9:27 AM  Advanced Directives  Does Patient Have a Medical Advance Directive? Yes No Yes Patient does not have advance directive;Patient would not like information Patient does not have advance directive;Patient would not like information  Type of AScientist, forensicPower of [place]ickas[place] will  Living will    Does patient want to make changes to medical advance directive?   No - Patient declined    [place]? No - copy requested      Pre-existing out of facility DNR order (yellow form or pink MOST form)    Yellow form placed in chart (order not valid for inpatient use)     Current Medications (verified) Outpatient Encounter Medications as of 07/02/2022  Medication Sig   amLODipine (NORVASC) 5 MG tablet Take 1 tablet (5 mg total) by mouth daily. Please keep scheduled appointment for future refills. Thank you.   aspirin EC 81 MG tablet Take 1 tablet (81 mg total) by mouth daily.   carvedilol (COREG) 3.125 MG tablet Take 1 tablet (3.125 mg total) by mouth 2 (two) times daily with a meal. Please  call [phone] schedule an overdue appointment for future refills. Thank you. 3rd attempt.   indomethacin (INDOCIN) 25 MG capsule indomethacin 25 mg capsule  TAKE TWO CAPSULES PER DAY AFTER MEALS AS NEEDED for pain   losartan-hydrochlorothiazide (HYZAAR) 100-25 MG tablet Take 1 tablet by mouth daily. Please call [phone] schedule an overdue appointment for future refills. Thank you. 3rd attempt.   metFORMIN (GLUCOPHAGE) 500 MG tablet TAKE ONE TABLET BY MOUTH DAILY WITH BREAKFAST   nitroGLYCERIN (NITROSTAT) 0.4 MG SL tablet Place 1 tablet (0.4 mg total) under the tongue every 5 (five) minutes as needed for chest pain.   rosuvastatin (CRESTOR) 40 MG tablet TAKE ONE TABLET BY MOUTH DAILY   amoxicillin (AMOXIL) 500 MG capsule Take 500 mg by mouth. Pt takes 4 tablets 1 hour prior to dental appointments.   No facility-administered encounter medications on file as of 07/02/2022.    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 NJohnsie Cancel  Diabetes mellitus without complication ([place]    Foot swelling    Gout    History of kidney stones    Hyperlipidemia    Hypertension    Myocardial infarction ([place] 1997   Nocturia    Obesity  Patellar tendonitis    Personal history of colonic polyp-adenoma  11/20/2013   Plantar fasciitis    Past Surgical History:  Procedure Laterality Date   APPENDECTOMY     COLONOSCOPY WITH PROPOFOL  11/14/2013   Dr.Gessner   COLONOSCOPY WITH PROPOFOL N/A 08/11/2021   Procedure: COLONOSCOPY WITH PROPOFOL;  Surgeon: Gatha Mayer, MD;  Location: [facility];  Service: Endoscopy;  Laterality: N/A;   CORONARY ARTERY BYPASS GRAFT  1997   3 VESSELS   HEMICOLECTOMY Right 2007   low grade mucnous appendiceal neoplasm   HERNIA REPAIR     INGUINAL HERNIA REPAIR  Right 03/17/2020   Procedure: RIGHT INGUINAL HERNIA REPAIR;  Surgeon: Ralene Ok, MD;  Location: [place];  Service: General;  Laterality: Right;   INSERTION OF MESH Right 03/17/2020   Procedure: INSERTION OF MESH;  Surgeon: Ralene Ok, MD;  Location: [place];  Service: General;  Laterality: Right;   JOINT REPLACEMENT Right 2014   POLYPECTOMY  08/11/2021   Procedure: POLYPECTOMY;  Surgeon: Gatha Mayer, MD;  Location: [facility];  Service: Endoscopy;;   TONSILLECTOMY     TOTAL HIP ARTHROPLASTY Right 06/12/2013   Procedure: RIGHT TOTAL HIP ARTHROPLASTY ANTERIOR APPROACH;  Surgeon: Mauri Pole, MD;  Location: [facility];  Service: Orthopedics;  Laterality: Right;   Family History  Problem Relation Age of Onset   Stroke Mother    Heart disease Mother    Aneurysm Father    Colon cancer Neg Hx    Esophageal cancer Neg Hx    Rectal cancer Neg Hx    Stomach cancer Neg Hx    Social History   Socioeconomic History   Marital status: Married    Spouse name: Not on file   Number of children: Not on file   Years of education: Not on file   Highest education level: Not on file  Occupational History   Occupation: Product manager: [employer]    Comment: Patent attorney  Tobacco Use   Smoking status: Never   Smokeless tobacco: Never  Vaping Use   Vaping Use: Never used  Substance and Sexual Activity   Alcohol use: Yes    Alcohol/week: 14.0 - 21.0 standard drinks of alcohol    Types: 14 - 21 Glasses of wine per week    Comment: 2-3 glasses wine daily   Drug use: No   Sexual activity: Yes  Other Topics Concern   Not on file  Social History Narrative   Married. No kids. 1 stapdaughter 31- no grandkids. 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       Hobbies: enjoys walking (plantar fasciitis inhibits and also patellar tendonitis)    Social Determinants of Health   Financial Resource Strain: Low Risk  (07/02/2022)   Overall Financial Resource Strain (CARDIA)    Difficulty of Paying Living Expenses: Not hard at all  Food Insecurity: No Food Insecurity (07/02/2022)   Hunger Vital Sign    Worried About Running Out of Food in the Last Year: Never true    Ran Out of Food in the Last Year: Never true  Transportation Needs: No Transportation Needs (07/02/2022)   PRAPARE - Hydrologist (Medical): No    Lack of Transportation (Non-Medical): No  Physical Activity: Sufficiently Active (07/02/2022)   Exercise Vital Sign    Days  of Exercise per Week: 7 days    Minutes of Exercise per Session: 60 min  Stress: No Stress Concern Present (07/02/2022)   [place]    Feeling of Stress : Not at all  Social Connections: Moderately Isolated (07/02/2022)   Social Connection and Isolation Panel [NHANES]    Frequency of Communication with Friends and Family: More than three times a week    Frequency of Social Gatherings with Friends and Family: Three times a week    Attends Religious Services: Never    Active Member of Clubs or Organizations: No    Attends Music therapist: Never    Marital Status: Married    Tobacco Counseling Counseling given: Not Answered   Clinical Intake:  Pre-visit preparation completed: Yes  Pain : No/denies pain     BMI - recorded: 33.9 Nutritional Status: BMI > 30  Obese Nutritional Risks: None Diabetes: Yes CBG done?: No Did pt. bring in CBG monitor from home?: No  How often do you need to have someone help you when you read instructions, pamphlets, or other written materials from your doctor or pharmacy?: 1 - Never  Diabetic?Nutrition Risk Assessment:  Has the patient had any N/V/D within the last 2 months?  No  Does the patient have any non-healing wounds?  No  Has the patient had  any unintentional weight loss or weight gain?  No   Diabetes:  Is the patient diabetic?  Yes  If diabetic, was a CBG obtained today?  No  Did the patient bring in their glucometer from home?  No  How often do you monitor your CBG's? N/A.   Financial Strains and Diabetes Management:  Are you having any financial strains with the device, your supplies or your medication? No .  Does the patient want to be seen by Chronic Care Management for management of their diabetes?  No  Would the patient like to be referred to a Nutritionist or for Diabetic Management?  No   Diabetic Exams:  Diabetic Eye Exam: Overdue for diabetic eye exam. Pt has been advised about the importance in completing this exam. Patient advised to call and schedule an eye exam. Diabetic Foot Exam: Overdue, Pt has been advised about the importance in completing this exam. Pt is scheduled for diabetic foot exam on next apt .   Interpreter Needed?: No  Information entered by :: Charlott Rakes, LPN   Activities of Daily Living    07/02/2022    1:11 PM  In your present state of health, do you have any difficulty performing the following activities:  Hearing? 0  Vision? 0  Difficulty concentrating or making decisions? 0  Walking or climbing stairs? 0  Dressing or bathing? 0  Doing errands, shopping? 0  Preparing Food and eating ? N  Using the Toilet? N  In the past six months, have you accidently leaked urine? N  Do you have problems with loss of bowel control? N  Managing your Medications? N  Managing your Finances? N  Housekeeping or managing your Housekeeping? N    Patient Care Team: Marin Olp, MD as PCP - General (Family Medicine) Josue Hector, MD as PCP - Cardiology (Cardiology) Madelin Rear, [facility] (Inactive) as Pharmacist (Pharmacist)  Indicate any recent Medical Services you may have received from other than [facility] providers in the past year (date may be approximate).     Assessment:   This is  a routine wellness  examination for Tollie.  Hearing/Vision screen Hearing Screening - Comments:: Pt denies any hearing issues  Vision Screening - Comments:: Pt follows up with Dr Dorena Cookey  For annual eye exams   Dietary issues and exercise activities discussed: Current Exercise Habits: Home exercise routine, Type of exercise: walking, Time (Minutes): 60, Frequency (Times/Week): 7, Weekly Exercise (Minutes/Week): 420   Goals Addressed             This Visit's Progress    Patient Stated       Lose weight to get to 250 lbs        Depression Screen    07/02/2022    1:07 PM 03/31/2021    2:44 PM 12/04/2019   10:48 AM 03/14/2018    1:48 PM 09/27/2016    8:12 AM  PHQ 2/9 Scores  PHQ - 2 Score 0 0 0 0 0  PHQ- 9 Score   0      Fall Risk    07/02/2022    1:11 PM 03/31/2021    2:44 PM 12/04/2019   10:32 AM 03/14/2018    1:48 PM 09/27/2016    8:12 AM  [place] in the past year? 0 0 1 No No  Number falls in past yr: 0 0 0    Injury with Fall? 0 0 1    Risk for fall due to : Impaired vision No Fall Risks     Follow up Falls prevention discussed Falls evaluation completed Falls evaluation completed;Education provided;Falls prevention discussed      FALL RISK PREVENTION PERTAINING TO THE HOME:  Any stairs in or around the home? Yes  If so, are there any without handrails? No  Home free of loose throw rugs in walkways, pet beds, electrical cords, etc? Yes  Adequate lighting in your home to reduce risk of falls? Yes   ASSISTIVE DEVICES UTILIZED TO PREVENT FALLS:  Life alert? No  Use of a cane, walker or w/c? No  Grab bars in the bathroom? No  Shower chair or bench in shower? No  Elevated toilet seat or a handicapped toilet? No   TIMED UP AND GO:  Was the test performed? No .   Cognitive Function:        07/02/2022    1:11 PM 12/04/2019   10:49 AM  6CIT Screen  What Year? 0 points 0 points  What month? 0 points 0 points  What time? 0 points 0 points  Count back  from 20 0 points 0 points  Months in reverse 0 points 0 points  Repeat phrase 0 points 0 points  Total Score 0 points 0 points    Immunizations Immunization History  Administered Date(s) Administered   PFIZER(Purple Top)SARS-COV-2 Vaccination 09/06/2019, 09/27/2019, 05/02/2020, 03/24/2021   Pfizer Covid-19 Vaccine Bivalent Booster 36yr & up 07/17/2021   Pneumococcal Conjugate-13 11/20/2014   Pneumococcal Polysaccharide-23 09/27/2016   Pneumococcal-Unspecified 08/03/2015   Tdap 01/29/2008, 02/26/2013   Zoster, Live 08/22/2013    TDAP status: Up to date  Flu Vaccine status: Due, Education has been provided regarding the importance of this vaccine. Advised may receive this vaccine at local pharmacy or Health Dept. Aware to provide a copy of the vaccination record if obtained from local pharmacy or Health Dept. Verbalized acceptance and understanding.  Pneumococcal vaccine status: Up to date  Covid-19 vaccine status: Completed vaccines  Qualifies for Shingles Vaccine? Yes   Zostavax completed No   Shingrix Completed?: No.    Education has been  provided regarding the importance of this vaccine. Patient has been advised to call insurance company to determine out of pocket expense if they have not yet received this vaccine. Advised may also receive vaccine at local pharmacy or Health Dept. Verbalized acceptance and understanding.  Screening Tests Health Maintenance  Topic Date Due   FOOT EXAM  Never done   OPHTHALMOLOGY EXAM  Never done   Diabetic kidney evaluation - Urine ACR  Never done   Zoster Vaccines- Shingrix (1 of 2) Never done   HEMOGLOBIN A1C  09/26/2021   INFLUENZA VACCINE  03/02/2022   Diabetic kidney evaluation - GFR measurement  03/26/2022   COVID-19 Vaccine (6 - 2023-24 season) 04/02/2022   DTaP/Tdap/Td (3 - Td or Tdap) 02/27/2023   Medicare Annual Wellness (AWV)  07/03/2023   COLONOSCOPY (Pts 45-26yr Insurance coverage will need to be confirmed)  08/11/2026    Pneumonia Vaccine 71 Years old  Completed   Hepatitis C Screening  Completed   HPV VACCINES  Aged Out    Health Maintenance  Health Maintenance Due  Topic Date Due   FOOT EXAM  Never done   OPHTHALMOLOGY EXAM  Never done   Diabetic kidney evaluation - Urine ACR  Never done   Zoster Vaccines- Shingrix (1 of 2) Never done   HEMOGLOBIN A1C  09/26/2021   INFLUENZA VACCINE  03/02/2022   Diabetic kidney evaluation - GFR measurement  03/26/2022   COVID-19 Vaccine (6 - 2023-24 season) 04/02/2022    Colorectal cancer screening: Type of screening: Colonoscopy. Completed 08/11/21. Repeat every 5 years  Additional Screening:  Hepatitis C Screening: Completed 08/03/12  Vision Screening: Recommended annual ophthalmology exams for early detection of glaucoma and other disorders of the eye. Is the patient up to date with their annual eye exam?  No  Who is the provider or what is the name of the office in which the patient attends annual eye exams? Dr TDorena Cookey If pt is not established with a provider, would they like to be referred to a provider to establish care? No .   Dental Screening: Recommended annual dental exams for proper oral hygiene  Community Resource Referral / Chronic Care Management: CRR required this visit?  No   CCM required this visit?  No      Plan:     I have personally reviewed and noted the following in the patient's chart:   Medical and social history Use of alcohol, tobacco or illicit drugs  Current medications and supplements including opioid prescriptions. Patient is not currently taking opioid prescriptions. Functional ability and status Nutritional status Physical activity Advanced directives List of other physicians Hospitalizations, surgeries, and ER visits in previous 12 months Vitals Screenings to include cognitive, depression, and falls Referrals and appointments  In addition, I have reviewed and discussed with patient certain preventive  protocols, quality metrics, and best practice recommendations. A written personalized care plan for preventive services as well as general preventive health recommendations were provided to patient.     TWillette Brace LPN   [id number]  Nurse Notes: none

## 2022-07-03 ENCOUNTER — Other Ambulatory Visit: Payer: Self-pay | Admitting: Cardiovascular Disease

## 2022-07-15 ENCOUNTER — Encounter: Payer: Self-pay | Admitting: *Deleted

## 2022-08-02 ENCOUNTER — Other Ambulatory Visit: Payer: Self-pay | Admitting: Cardiovascular Disease

## 2022-08-19 NOTE — Progress Notes (Signed)
Cardiology Office Note    Date:  09/01/2022   ID:  VONZELL LINDBLAD, DOB October 11, 1950, MRN [id number]  PCP:  Marin Olp, MD  Cardiologist: Dr. Johnsie Cancel  CC: follow up   History of Present Illness:   72 y.o. CAD/CABG 1997 HTN and Gout. Last myovue 04/29/15 inferior scar no ischemia. Cardiac MRI 05/22/15 EF 42% so no AICD. Had reaction to gadolinium with hives and pruritis so should not have again. HTN Rx coreg and norvasc ? Some neck pain when on ARB Needs to be reminded to take ASA daily. Probable gouty knee arthritis better with allopurinol  Walking trails by [employer] a lot.   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 Johnsie Cancel   Diabetes mellitus without complication [facility])    Foot swelling    Gout    History of kidney stones    Hyperlipidemia    Hypertension    Myocardial infarction ([place]) 1997   Nocturia    Obesity    Patellar tendonitis    Personal history of colonic polyp-adenoma  11/20/2013   Plantar fasciitis     Past Surgical History:  Procedure Laterality Date   APPENDECTOMY     COLONOSCOPY WITH PROPOFOL  11/14/2013   Dr.Gessner   COLONOSCOPY WITH PROPOFOL N/A 08/11/2021   Procedure: COLONOSCOPY WITH PROPOFOL;  Surgeon: Gatha Mayer, MD;  [facility];  Service: Endoscopy;  Laterality: N/A;   CORONARY ARTERY BYPASS GRAFT  1997   3 VESSELS   HEMICOLECTOMY Right 2007   low grade mucnous appendiceal neoplasm   HERNIA REPAIR     INGUINAL HERNIA REPAIR Right 03/17/2020   Procedure: RIGHT INGUINAL HERNIA REPAIR;  Surgeon: Ralene Ok, MD;  Location: [place];   Service: General;  Laterality: Right;   INSERTION OF MESH Right 03/17/2020   Procedure: INSERTION OF MESH;  Surgeon: Ralene Ok, MD;  Location: [place];  Service: General;  Laterality: Right;   JOINT REPLACEMENT Right 2014   POLYPECTOMY  08/11/2021   Procedure: POLYPECTOMY;  Surgeon: Gatha Mayer, MD;  [facility];  Service: Endoscopy;;   TONSILLECTOMY     TOTAL HIP ARTHROPLASTY Right 06/12/2013   Procedure: RIGHT TOTAL HIP ARTHROPLASTY ANTERIOR APPROACH;  Surgeon: Mauri Pole, MD;  [facility];  Service: Orthopedics;  Laterality: Right;    Current Medications: Outpatient Medications Prior to Visit  Medication Sig Dispense Refill   amLODipine (NORVASC) 5 MG tablet Take 1 tablet (5 mg total) by mouth daily. Please keep scheduled appointment for future refills. Thank you. 30 tablet 10   aspirin EC 81 MG tablet Take 1 tablet (81 mg total) by mouth daily. 90 tablet 3   carvedilol (COREG) 3.125 MG tablet TAKE 1 TABLET BY MOUTH TWO TIMES A DAY *NEEDS APPOINTMENT* 60 tablet 0   indomethacin (INDOCIN) 25 MG capsule indomethacin 25 mg capsule  TAKE TWO CAPSULES PER DAY AFTER MEALS AS NEEDED for pain 60 capsule 3   losartan-hydrochlorothiazide (HYZAAR) 100-25 MG tablet TAKE 1 TABLET BY MOUTH DAILY *NEEDS APPOINTMENT* 30  tablet 0   metFORMIN (GLUCOPHAGE) 500 MG tablet TAKE ONE TABLET BY MOUTH DAILY WITH BREAKFAST 90 tablet 1   nitroGLYCERIN (NITROSTAT) 0.4 MG SL tablet Place 1 tablet (0.4 mg total) under the tongue every 5 (five) minutes as needed for chest pain. 25 tablet 3   rosuvastatin (CRESTOR) 40 MG tablet TAKE ONE TABLET BY MOUTH DAILY 90 tablet 3   amoxicillin (AMOXIL) 500 MG capsule Take 500 mg by mouth. Pt takes 4 tablets 1 hour prior to dental appointments.     No facility-administered medications prior to visit.     Allergies:   Bee venom, Gadolinium derivatives, and Poison ivy extract   Social History   Socioeconomic History   Marital status: Married     Spouse name: Not on file   Number of children: Not on file   Years of education: Not on file   Highest education level: Not on file  Occupational History   Occupation: Product manager: [employer]    Comment: Patent attorney  Tobacco Use   Smoking status: Never   Smokeless tobacco: Never  Vaping Use   Vaping Use: Never used  Substance and Sexual Activity   Alcohol use: Yes    Alcohol/week: 14.0 - 21.0 standard drinks of alcohol    Types: 14 - 21 Glasses of wine per week    Comment: 2-3 glasses wine daily   Drug use: No   Sexual activity: Yes  Other Topics Concern   Not on file  Social History Narrative   Married. No kids. 1 stapdaughter 31- no grandkids. 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       Hobbies: enjoys walking (plantar fasciitis inhibits and also patellar tendonitis)   Social Determinants of Health   Financial Resource Strain: Low Risk  (07/02/2022)   Overall Financial Resource Strain (CARDIA)    Difficulty of Paying Living Expenses: Not hard at all  Food Insecurity: No Food Insecurity (07/02/2022)   Hunger Vital Sign    Worried About Running Out of Food in the Last Year: Never true    Ran Out of Food in the Last Year: Never true  Transportation Needs: No Transportation Needs (07/02/2022)   PRAPARE - Hydrologist (Medical): No    Lack of Transportation (Non-Medical): No  Physical Activity: Sufficiently Active (07/02/2022)   Exercise Vital Sign    Days of Exercise per Week: 7 days    Minutes of Exercise per Session: 60 min  Stress: No Stress Concern Present (07/02/2022)   [place]    Feeling of Stress : Not at all  Social Connections: Moderately Isolated (07/02/2022)   Social Connection and Isolation Panel [NHANES]    Frequency  of Communication with Friends and Family: More than three times a week    Frequency of Social Gatherings with Friends and Family: Three times a week    Attends Religious Services: Never    Active Member of Clubs or Organizations: No    Attends Archivist Meetings: Never    Marital Status: Married     Family History:  The patient's family history includes Aneurysm in his father; Heart disease in his mother; Stroke in his mother.      ROS:   Please see the history of present illness.  ROS All other systems reviewed and are negative.   PHYSICAL EXAM:   VS:  BP 138/88   Pulse 73   Ht '6\' 1"'$  (1.854 m)   Wt 273 lb (123.8 kg)   BMI 36.02 kg/m    Affect appropriate Overweight white male  HEENT: normal Neck supple with no adenopathy JVP normal no bruits no thyromegaly Lungs clear with no wheezing and good diaphragmatic motion Heart:  S1/S2 no murmur, no rub, gallop or click PMI normal post sternotomy  Abdomen: benighn, BS positve, no tenderness, no AAA no bruit.  No HSM or HJR right inguinal hernia repair  Distal pulses intact with no bruits No edema Neuro non-focal Skin warm and dry No muscular weakness    Wt Readings from Last 3 Encounters:  09/01/22 273 lb (123.8 kg)  07/02/22 264 lb (119.7 kg)  08/11/21 264 lb 8.8 oz (120 kg)      Studies/Labs Reviewed:   EKG:  SR rate 39 old IMI no acute changes  09/01/2022   Recent Labs: No results found for requested labs within last 365 days.   Lipid Panel    Component Value Date/Time   CHOL 157 03/26/2021 0827   CHOL 181 08/25/2016 0848   TRIG 93.0 03/26/2021 0827   TRIG 269 (HH) 08/01/2006 1039   HDL 54.60 03/26/2021 0827   HDL 49 08/25/2016 0848   CHOLHDL 3 03/26/2021 0827   VLDL 18.6 03/26/2021 0827   LDLCALC 84 03/26/2021 0827   LDLCALC 92 08/25/2016 0848   LDLDIRECT 81.0 07/20/2018 0848    Additional studies/ records that were reviewed today include:  Myoview 04/2015 Study Highlights   The left  ventricular ejection fraction is moderately decreased (30-44%). Nuclear stress EF: 37%. There was no ST segment deviation noted during stress. Defect 1: There is a large defect of severe severity present in the basal inferior, basal inferolateral, mid inferior, mid inferolateral, apical inferior and apical lateral location. Findings consistent with prior myocardial infarction. This is an intermediate risk study.      Cardiac MR 05/2015 IMPRESSION: 1) Moderate LV dilatation with severe inferior wall hypokinesis consistent with previous MI. EF 42% 2) Biatrial enlargement 3) Normal RV 4) No significant ischemic MR 5) Significant Gadolinium reaction. Patient should not receive again see Rx noted above   ASSESSMENT & PLAN:   CAD s/p CABG x3V (1997): Non ischemic myovue 2016 continue medical Rx  No cardiac issues with general anesthesia August 2021 continue statin, ASA and beta blocker    Morbid Obesity: Discussed low carb diet and exercise   HLD: continue statin. Crestor 40 mg most recent LDL in Epic 84  HTN: better with norvasc    Gout: continue indocin and allopurinol try not to escalate diuretic Rx  DM:  Discussed low carb diet.  Target hemoglobin A1c is 6.5 or less.  Continue current medications.A1c 6.3 12/04/19     F/U in a year    Jenkins Rouge

## 2022-09-01 ENCOUNTER — Other Ambulatory Visit: Payer: Self-pay | Admitting: Cardiovascular Disease

## 2022-09-01 ENCOUNTER — Ambulatory Visit: Payer: Medicare HMO | Attending: Cardiovascular Disease | Admitting: Cardiovascular Disease

## 2022-09-01 ENCOUNTER — Encounter: Payer: Self-pay | Admitting: Cardiovascular Disease

## 2022-09-01 VITALS — BP 138/88 | HR 73 | Ht 73.0 in | Wt 273.0 lb

## 2022-09-01 DIAGNOSIS — Z951 Presence of aortocoronary bypass graft: Secondary | ICD-10-CM

## 2022-09-01 DIAGNOSIS — I10 Essential (primary) hypertension: Secondary | ICD-10-CM

## 2022-09-01 DIAGNOSIS — I25810 Atherosclerosis of coronary artery bypass graft(s) without angina pectoris: Secondary | ICD-10-CM | POA: Diagnosis not present

## 2022-09-01 MED ORDER — LOSARTAN POTASSIUM-HCTZ 100-25 MG PO TABS: 1.0000 | ORAL_TABLET | Freq: Every day | ORAL | 3 refills | Status: DC

## 2022-09-01 MED ORDER — NITROGLYCERIN 0.4 MG SL SUBL: 0.4000 mg | SUBLINGUAL_TABLET | SUBLINGUAL | 3 refills | Status: DC | PRN

## 2022-09-01 MED ORDER — CARVEDILOL 3.125 MG PO TABS: 3.1250 mg | ORAL_TABLET | Freq: Two times a day (BID) | ORAL | 3 refills | Status: DC

## 2022-09-01 NOTE — Patient Instructions (Signed)
Medication Instructions:  Your physician recommends that you continue on your current medications as directed. Please refer to the Current Medication list given to you today.  *If you need a refill on your cardiac medications before your next appointment, please call your pharmacy*  Lab Work: If you have labs (blood work) drawn today and your tests are completely normal, you will receive your results only by: MyChart Message (if you have MyChart) OR A paper copy in the mail If you have any lab test that is abnormal or we need to change your treatment, we will call you to review the results.  Testing/Procedures: None ordered today.  Follow-Up: At  [facility], you and your health needs are our priority.  As part of our continuing mission to provide you with exceptional heart care, we have created designated Provider Care Teams.  These Care Teams include your primary Cardiologist (physician) and Advanced Practice Providers (APPs -  Physician Assistants and Nurse Practitioners) who all work together to provide you with the care you need, when you need it.  We recommend signing up for the patient portal called "MyChart".  Sign up information is provided on this After Visit Summary.  MyChart is used to connect with patients for Virtual Visits (Telemedicine).  Patients are able to view lab/test results, encounter notes, upcoming appointments, etc.  Non-urgent messages can be sent to your provider as well.   To learn more about what you can do with MyChart, go to [email or web address].    Your next appointment:   1 year(s)  Provider:   Peter Nishan, MD      

## 2022-09-01 NOTE — Telephone Encounter (Signed)
Patient has appointment in office today and  nurse will refill after visit to make sure no dose changes

## 2022-09-09 ENCOUNTER — Encounter: Payer: Self-pay | Admitting: Family Medicine

## 2022-09-09 ENCOUNTER — Ambulatory Visit (INDEPENDENT_AMBULATORY_CARE_PROVIDER_SITE_OTHER): Payer: Medicare HMO | Admitting: Family Medicine

## 2022-09-09 VITALS — BP 132/80 | HR 81 | Temp 97.0°F | Ht 73.0 in | Wt 274.8 lb

## 2022-09-09 DIAGNOSIS — R351 Nocturia: Secondary | ICD-10-CM | POA: Diagnosis not present

## 2022-09-09 DIAGNOSIS — E785 Hyperlipidemia, unspecified: Secondary | ICD-10-CM

## 2022-09-09 DIAGNOSIS — M1A00X0 Idiopathic chronic gout, unspecified site, without tophus (tophi): Secondary | ICD-10-CM

## 2022-09-09 DIAGNOSIS — Z0000 Encounter for general adult medical examination without abnormal findings: Secondary | ICD-10-CM | POA: Diagnosis not present

## 2022-09-09 DIAGNOSIS — R739 Hyperglycemia, unspecified: Secondary | ICD-10-CM | POA: Diagnosis not present

## 2022-09-09 NOTE — Patient Instructions (Addendum)
Please stop by lab before you go If you have mychart- we will send your results within 3 business days of [place] receiving them.  If you do not have mychart- we will call you about results within 5 business days of [place] receiving them.  *please also note that you will see labs on mychart as soon as they post. I will later go in and write notes on them- will say "notes from Dr. Yong Channel"   Might be worth voltaren gel trial on spot on knee that bothers you  Recommended follow up: Return in about 6 months (around 03/10/2023) for followup or sooner if needed.Schedule b4 you leave.

## 2022-09-09 NOTE — Progress Notes (Signed)
Phone: [phone]   Subjective:  Patient presents today for their annual physical. Chief complaint-noted.   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 Johnsie Cancel   Diabetes mellitus without complication ([place])    Foot swelling    Gout    History of kidney stones    Hyperlipidemia    Hypertension    Myocardial infarction ([place]) 1997   Nocturia    Obesity    Patellar tendonitis    Personal history of colonic polyp-adenoma  11/20/2013   Plantar fasciitis    Patient Active Problem List   Diagnosis Date Noted   CAD (coronary artery disease) 02/21/2007    Priority: High   Hyperglycemia, unspecified 12/04/2019    Priority: Medium    Fatty liver 12/19/2012    Priority: Medium    Gout 09/01/2009    Priority: Medium    Hyperlipidemia 02/21/2007    Priority: Medium    Essential hypertension 02/21/2007    Priority: Medium    Plantar fasciitis     Priority: Low   Left hip pain 11/20/2014    Priority: Low   Dermatitis 11/20/2014    Priority: Low   Personal history of colonic polyp-adenoma  11/20/2013    Priority: Low   Obese 06/13/2013    Priority: Low   S/P right THA, AA 06/12/2013    Priority: Low   Left foot pain 12/19/2012    Priority: Low   Actinic keratosis 08/03/2012    Priority: Low   Kidney stone 02/21/2007    Priority: Low   Diabetes mellitus without complication ([place]) 82/95/6213   Benign neoplasm of descending colon    Past Surgical History:  Procedure Laterality Date   APPENDECTOMY     COLONOSCOPY WITH PROPOFOL  11/14/2013   Dr.Gessner    COLONOSCOPY WITH PROPOFOL N/A 08/11/2021   Procedure: COLONOSCOPY WITH PROPOFOL;  Surgeon: Gatha Mayer, MD;  [facility];  Service: Endoscopy;  Laterality: N/A;   CORONARY ARTERY BYPASS GRAFT  1997   3 VESSELS   HEMICOLECTOMY Right 2007   low grade mucnous appendiceal neoplasm   HERNIA REPAIR     INGUINAL HERNIA REPAIR Right 03/17/2020   Procedure: RIGHT INGUINAL HERNIA REPAIR;  Surgeon: Ralene Ok, MD;  Location: [place];  Service: General;  Laterality: Right;   INSERTION OF MESH Right 03/17/2020   Procedure: INSERTION OF MESH;  Surgeon: Ralene Ok, MD;  Location: [place];  Service: General;  Laterality: Right;   JOINT REPLACEMENT Right 2014   POLYPECTOMY  08/11/2021   Procedure: POLYPECTOMY;  Surgeon: Gatha Mayer, MD;  [facility];  Service: Endoscopy;;   TONSILLECTOMY     TOTAL HIP ARTHROPLASTY Right 06/12/2013   Procedure: RIGHT TOTAL HIP ARTHROPLASTY ANTERIOR APPROACH;  Surgeon: Mauri Pole, MD;  [facility];  Service: Orthopedics;  Laterality: Right;    Family History  Problem Relation Age of Onset   Stroke Mother    Heart disease Mother    Aneurysm Father    Colon cancer Neg Hx    Esophageal cancer Neg Hx  Rectal cancer Neg Hx    Stomach cancer Neg Hx     Medications- reviewed and updated Current Outpatient Medications  Medication Sig Dispense Refill   amLODipine (NORVASC) 5 MG tablet Take 1 tablet (5 mg total) by mouth daily. Please keep scheduled appointment for future refills. Thank you. 30 tablet 10   amoxicillin (AMOXIL) 500 MG capsule Take 500 mg by mouth. Pt takes 4 tablets 1 hour prior to dental appointments.     aspirin EC 81 MG tablet Take 1 tablet (81 mg total) by mouth daily. 90 tablet 3   carvedilol (COREG) 3.125 MG tablet Take 1 tablet (3.125 mg total) by mouth 2 (two) times daily with a meal. 180 tablet 3   indomethacin (INDOCIN) 25 MG capsule indomethacin 25 mg capsule  TAKE TWO CAPSULES PER DAY AFTER MEALS AS  NEEDED for pain 60 capsule 3   losartan-hydrochlorothiazide (HYZAAR) 100-25 MG tablet Take 1 tablet by mouth daily. 90 tablet 3   metFORMIN (GLUCOPHAGE) 500 MG tablet TAKE ONE TABLET BY MOUTH DAILY WITH BREAKFAST 90 tablet 1   nitroGLYCERIN (NITROSTAT) 0.4 MG SL tablet Place 1 tablet (0.4 mg total) under the tongue every 5 (five) minutes as needed for chest pain. 25 tablet 3   rosuvastatin (CRESTOR) 40 MG tablet TAKE ONE TABLET BY MOUTH DAILY 90 tablet 3   No current facility-administered medications for this visit.    Allergies-reviewed and updated Allergies  Allergen Reactions   Bee Venom Anaphylaxis    Due to heart medicine    Gadolinium Derivatives Shortness Of Breath, Itching, Swelling, Cough and Anaphylaxis    Symptoms include: SOB, wheezing, throat numbness, cough, congestion of nasal cavities, itching of hand and foot, and swelling. Contrast agent given- MultiHance.   Poison Ivy Extract Other (See Comments)    Sometimes    Social History   Social History Narrative   Married. No kids. 1 stapdaughter 31- no grandkids. 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       Hobbies: enjoys walking (plantar fasciitis inhibits and also patellar tendonitis)   Objective  Objective:  BP 132/80   Pulse 81   Temp (!) 97 F (36.1 C)   Ht '6\' 1"'$  (1.854 m)   Wt 274 lb 12.8 oz (124.6 kg)   SpO2 96%   BMI 36.26 kg/m  Gen: NAD, resting comfortably HEENT: Mucous membranes are moist. Oropharynx normal Neck: no thyromegaly CV: RRR no murmurs rubs or gallops Lungs: CTAB no crackles, wheeze, rhonchi Abdomen: soft/nontender/nondistended/normal bowel sounds. No rebound or guarding.  Ext: trace edema, some dry skin on feet and callous- recommended vaseline Skin: warm, dry Neuro: grossly normal, moves all extremities, PERRLA   Assessment and Plan  72 y.o. male  presenting for annual physical.  Health Maintenance counseling: 1. Anticipatory guidance: Patient counseled regarding regular dental exams -q6 months, eye exams -yearly,  avoiding smoking and second hand smoke , limiting alcohol to 2 beverages per day - about -3-4 a day encouraged max 2 a day or less, no illicit drugs .   2. Risk factor reduction:  Advised patient of need for regular exercise and diet rich and fruits and vegetables to reduce risk of heart attack and stroke.  Exercise- doing a great job with regular walking still! [place] [address] and very convenient.  Diet/weight management-feels like could cut wine down to help weight. Morbid obesity noted  with BMI over 35 with hypertension and hyperlipidemia Wt Readings from Last 3 Encounters:  09/09/22 274 lb 12.8 oz (124.6 kg)  09/01/22 273 lb (123.8 kg)  07/02/22 264 lb (119.7 kg)  3. Immunizations/screenings/ancillary studies- shingrix opts out for now , still considering covid shot Immunization History  Administered Date(s) Administered   PFIZER(Purple Top)SARS-COV-2 Vaccination 09/06/2019, 09/27/2019, 05/02/2020, 03/24/2021   Pfizer Covid-19 Vaccine Bivalent Booster 73yr & up 07/17/2021   Pneumococcal Conjugate-13 11/20/2014   Pneumococcal Polysaccharide-23 09/27/2016   Pneumococcal-Unspecified 08/03/2015   Tdap 01/29/2008, 02/26/2013   Zoster, Live 08/22/2013  4. Prostate cancer screening- opts in when I offered PSA since we are unable to obtain 1 last year- prior low risk  Lab Results  Component Value Date   PSA 0.66 03/26/2021   PSA 0.57 12/04/2019   PSA 0.73 03/14/2018   5. Colon cancer screening - 08/11/21 with 5 year repeat with Dr. GCarlean Purl6. Skin cancer screening- follows with Dr. LAllyson Sabal- seen in past- considering calling to get in with new doctor. advised regular sunscreen use. Denies worrisome, changing, or new skin lesions.  7. never smoker  8. STD screening - patient declines- opts out as monogamous   Status of  chronic or acute concerns    #CAD - follows with Dr. NJohnsie Cancel#hyperlipidemia S: Medication: aspirin 81 mg and rosuvastatin '40mg'$  as well as coreg 3.125 mg BID   -zetia very expensive on his plan and he has declined- on same plan - no chest pain or shortness of breath  Lab Results  Component Value Date   CHOL 157 03/26/2021   HDL 54.60 03/26/2021   LDLCALC 84 03/26/2021   LDLDIRECT 81.0 07/20/2018   TRIG 93.0 03/26/2021   CHOLHDL 3 03/26/2021  A/P: CAd asymptomatic continue current medications  Lipids- update panel- wants to hold on zetia due to cost- did mention [facility] with goodrx at $21 option- he prefers not when looking like side effects.    #hypertension- slight white coat element S: medication: Carvedilol 3.125 mg twice daily, losartan hydrochlorothiazide 100-25 mg, amlodipine 5 mg Home readings #s:  typically 130s/80s when checks but no recnet checks BP Readings from Last 3 Encounters:  09/09/22 132/80  09/01/22 138/88  08/11/21 138/86  A/P: doing really well in office surprisingly today- continue current medications    # Hyperglycemia/insulin resistance/prediabetes-peak A1c 6.6x1.  With diagnose of diabetes if had another score of 6.5 or above S:  Medication: Metformin 500 mg daily Lab Results  Component Value Date   HGBA1C 6.3 03/26/2021   HGBA1C 6.3 (H) 03/06/2020   HGBA1C 6.3 12/04/2019   A/P: hopefully stable- update a1c today. Continue current meds for now  #Gout S: Has not wanted to take allopurinol.  Hydrochlorothiazide may increase risk. Last uric acid 2014. No flares A/P:no issues in years wants to hold off on testing    # very sparing indomethacin for msk  Pain such as knees-through orthopedist or [place] He is aware of slight increased CV risk with this but uses less than once a month- last fill 2022 so using rarely  # fatty liver- mild elevations in LFTs in past- better in 2019 with weight loss. 3-4 glasses of red wine per day- have encouraged max 2 per day -  he is considering.   Recommended follow up: Return in about 6 months (around 03/10/2023) for followup or sooner if needed.Schedule b4 you leave. Future Appointments  Date Time Provider [place] 07/08/2023  1:30 PM [facility]  Lab/Order associations: fasting   ICD-10-CM   1. Preventative health care  Z00.00     2. Hyperglycemia, unspecified  R73.9 Microalbumin / creatinine urine ratio    HgB A1c    3. Idiopathic chronic gout without tophus, unspecified site  M1A.00X0     4. Hyperlipidemia, unspecified hyperlipidemia type  E78.5 CBC with Differential/Platelet    Comprehensive metabolic panel    Lipid panel    5. Nocturia  R35.1 PSA      No orders of the defined types were placed in this encounter.   Return precautions advised.  Garret Reddish, MD

## 2022-09-10 LAB — CBC WITH DIFFERENTIAL/PLATELET
Basophils Absolute: 0 10*3/uL (ref 0.0–0.1)
Basophils Relative: 0.7 % (ref 0.0–3.0)
Eosinophils Absolute: 0.1 10*3/uL (ref 0.0–0.7)
Eosinophils Relative: 1.8 % (ref 0.0–5.0)
HCT: 37.9 % — ABNORMAL LOW (ref 39.0–52.0)
Hemoglobin: 12.7 g/dL — ABNORMAL LOW (ref 13.0–17.0)
Lymphocytes Relative: 24 % (ref 12.0–46.0)
Lymphs Abs: 1.4 10*3/uL (ref 0.7–4.0)
MCHC: 33.6 g/dL (ref 30.0–36.0)
MCV: 99.2 fl (ref 78.0–100.0)
Monocytes Absolute: 0.6 10*3/uL (ref 0.1–1.0)
Monocytes Relative: 10.2 % (ref 3.0–12.0)
Neutro Abs: 3.7 10*3/uL (ref 1.4–7.7)
Neutrophils Relative %: 63.3 % (ref 43.0–77.0)
Platelets: 200 10*3/uL (ref 150.0–400.0)
RBC: 3.82 Mil/uL — ABNORMAL LOW (ref 4.22–5.81)
RDW: 14.4 % (ref 11.5–15.5)
WBC: 5.8 10*3/uL (ref 4.0–10.5)

## 2022-09-10 LAB — COMPREHENSIVE METABOLIC PANEL
ALT: 23 U/L (ref 0–53)
AST: 24 U/L (ref 0–37)
Albumin: 4.5 g/dL (ref 3.5–5.2)
Alkaline Phosphatase: 52 U/L (ref 39–117)
BUN: 17 mg/dL (ref 6–23)
CO2: 29 mEq/L (ref 19–32)
Calcium: 9.8 mg/dL (ref 8.4–10.5)
Chloride: 96 mEq/L (ref 96–112)
Creatinine, Ser: 0.92 mg/dL (ref 0.40–1.50)
GFR: 83.75 mL/min (ref >60.00)
Glucose, Bld: 111 mg/dL — ABNORMAL HIGH (ref 70–99)
Potassium: 4.5 mEq/L (ref 3.5–5.1)
Sodium: 137 mEq/L (ref 135–145)
Total Bilirubin: 1 mg/dL (ref 0.2–1.2)
Total Protein: 7.3 g/dL (ref 6.0–8.3)

## 2022-09-10 LAB — LIPID PANEL
Cholesterol: 179 mg/dL (ref 0–200)
HDL: 69.9 mg/dL
LDL Cholesterol: 93 mg/dL (ref 0–99)
NonHDL: 109.03
Total CHOL/HDL Ratio: 3
Triglycerides: 82 mg/dL (ref 0.0–149.0)
VLDL: 16.4 mg/dL (ref 0.0–40.0)

## 2022-09-10 LAB — HEMOGLOBIN A1C: Hgb A1c MFr Bld: 6.4 % (ref 4.6–6.5)

## 2022-09-10 LAB — MICROALBUMIN / CREATININE URINE RATIO
Creatinine,U: 141.8 mg/dL
Microalb Creat Ratio: 22.8 mg/g (ref 0.0–30.0)
Microalb, Ur: 32.3 mg/dL — ABNORMAL HIGH (ref 0.0–1.9)

## 2022-09-10 LAB — PSA: PSA: 0.81 ng/mL (ref 0.10–4.00)

## 2022-10-27 ENCOUNTER — Other Ambulatory Visit: Payer: Self-pay | Admitting: Family Medicine

## 2022-11-03 ENCOUNTER — Other Ambulatory Visit: Payer: Self-pay | Admitting: Family Medicine

## 2023-01-31 ENCOUNTER — Telehealth: Payer: Self-pay | Admitting: Cardiovascular Disease

## 2023-01-31 NOTE — Telephone Encounter (Signed)
Patient calling to speak to the nurse. He has covid, waned to see what medication he can take with the current medication he is on. Please advise

## 2023-02-01 NOTE — Telephone Encounter (Signed)
You want to avoid any product that says its "D" or "PE",  but DM (dextromethorphan) is ok to take for cough. The "D" means it has pseudoephedrine (Sudafed) in it and "PE" means it has phenylephrine in it. These both can increase your blood pressure and heart rate. You also want to avoid NSAID's like ibuprofen and naproxen. Tylenol is ok to take for pain/fever. You can try saline nasal rinses (such as a netti pot) for congestion along with Flonase or Atrovent. Cough drops are ok too for sore throat. Antihistamines like chlorpheniramine (can cause drowsiness) is good for runny nose/sneezing. Antihistamines like allegra and zyrtec are ok to take as well. These do not cause drowsiness typically. For a productive cough, can take generic or brand Mucinex. For a dry cough generic or brand Delsym (dextromethorphan).  

## 2023-02-01 NOTE — Telephone Encounter (Signed)
Called patient back to about message and PharmD recommendations. Informed patient that the message would also be sent to mychart. Patient verbalized understanding.

## 2023-02-04 ENCOUNTER — Other Ambulatory Visit: Payer: Self-pay | Admitting: Family Medicine

## 2023-03-10 ENCOUNTER — Ambulatory Visit: Payer: Medicare HMO | Admitting: Family Medicine

## 2023-05-02 ENCOUNTER — Other Ambulatory Visit: Payer: Self-pay | Admitting: Cardiovascular Disease

## 2023-06-03 ENCOUNTER — Encounter: Payer: Self-pay | Admitting: Family Medicine

## 2023-06-03 ENCOUNTER — Ambulatory Visit (INDEPENDENT_AMBULATORY_CARE_PROVIDER_SITE_OTHER): Payer: Medicare HMO | Admitting: Family Medicine

## 2023-06-03 VITALS — BP 124/80 | HR 75 | Temp 97.8°F | Ht 73.0 in | Wt 272.8 lb

## 2023-06-03 DIAGNOSIS — E785 Hyperlipidemia, unspecified: Secondary | ICD-10-CM

## 2023-06-03 DIAGNOSIS — I10 Essential (primary) hypertension: Secondary | ICD-10-CM | POA: Diagnosis not present

## 2023-06-03 DIAGNOSIS — R739 Hyperglycemia, unspecified: Secondary | ICD-10-CM

## 2023-06-03 DIAGNOSIS — Z131 Encounter for screening for diabetes mellitus: Secondary | ICD-10-CM

## 2023-06-03 MED ORDER — NITROGLYCERIN 0.4 MG SL SUBL: 0.4000 mg | SUBLINGUAL_TABLET | SUBLINGUAL | 3 refills | Status: AC | PRN

## 2023-06-03 MED ORDER — INDOMETHACIN 25 MG PO CAPS: ORAL_CAPSULE | ORAL | 3 refills | Status: DC

## 2023-06-03 NOTE — Patient Instructions (Addendum)
Let us know if you get your 2nd [facility], COVID , Flu or Tdap vaccine at your pharmacy.  You are eligible to schedule your annual wellness visit with our nurse specialist Inetta Fermo.  Please consider scheduling this before you leave today  Glad you are doing reasonably well  Recommended follow up: Return in about 5 months (around 11/01/2023) for physical or sooner if needed.Schedule b4 you leave.

## 2023-06-03 NOTE — Addendum Note (Signed)
Addended by: Shelva Majestic on: 06/03/2023 12:15 PM   Modules accepted: Orders

## 2023-06-03 NOTE — Progress Notes (Signed)
Phone [phone] In person visit   Subjective:   Shawn Anderson is a 72 y.o. year old very pleasant male patient who presents for/with See problem oriented charting Chief Complaint  Patient presents with   Medical Management of Chronic Issues        Hypertension   leg cramp    Pt c/o leg cramp this morning in right leg that caused tendinitis.    Past Medical History-  Patient Active Problem List   Diagnosis Date Noted   CAD (coronary artery disease) 02/21/2007    Priority: High   Hyperglycemia, unspecified 12/04/2019    Priority: Medium    Fatty liver 12/19/2012    Priority: Medium    Gout 09/01/2009    Priority: Medium    Hyperlipidemia 02/21/2007    Priority: Medium    Essential hypertension 02/21/2007    Priority: Medium    Plantar fasciitis     Priority: Low   Left hip pain 11/20/2014    Priority: Low   Dermatitis 11/20/2014    Priority: Low   History of colonic polyps 11/20/2013    Priority: Low   Morbid obesity, unspecified obesity type (HCC) 06/13/2013    Priority: Low   S/P right THA, AA 06/12/2013    Priority: Low   Left foot pain 12/19/2012    Priority: Low   Actinic keratosis 08/03/2012    Priority: Low   Kidney stone 02/21/2007    Priority: Low   Benign neoplasm of descending colon     Medications- reviewed and updated Current Outpatient Medications  Medication Sig Dispense Refill   amLODipine (NORVASC) 5 MG tablet Take 1 tablet (5 mg total) by mouth daily. Please call [phone] to schedule a January appointment with Dr. Charlton Haws for future refills. Thank you. 30 tablet 2   amoxicillin (AMOXIL) 500 MG capsule Take 500 mg by mouth. Pt takes 4 tablets 1 hour prior to dental appointments.     aspirin EC 81 MG tablet Take 1 tablet (81 mg total) by mouth daily. 90 tablet 3   carvedilol (COREG) 3.125 MG tablet Take 1 tablet (3.125 mg total) by mouth 2 (two) times daily with a meal. 180 tablet 3   indomethacin (INDOCIN) 25 MG capsule  indomethacin 25 mg capsule  TAKE TWO CAPSULES PER DAY AFTER MEALS AS NEEDED for pain 60 capsule 3   losartan-hydrochlorothiazide (HYZAAR) 100-25 MG tablet Take 1 tablet by mouth daily. 90 tablet 3   metFORMIN (GLUCOPHAGE) 500 MG tablet TAKE 1 TABLET BY MOUTH DAILY WITH BREAKFAST 100 tablet 3   nitroGLYCERIN (NITROSTAT) 0.4 MG SL tablet Place 1 tablet (0.4 mg total) under the tongue every 5 (five) minutes as needed for chest pain. 25 tablet 3   rosuvastatin (CRESTOR) 40 MG tablet TAKE ONE TABLET BY MOUTH DAILY 90 tablet 3   No current facility-administered medications for this visit.     Objective:  BP 124/80   Pulse 75   Temp 97.8 F (36.6 C)   Ht 6\' 1"  (1.854 m)   Wt 272 lb 12.8 oz (123.7 kg)   SpO2 98%   BMI 35.99 kg/m  Gen: NAD, resting comfortably CV: RRR no murmurs rubs or gallops Lungs: CTAB no crackles, wheeze, rhonchi Abdomen: soft/nontender/nondistended/normal bowel sounds.  Ext: trace edema Skin: warm, dry     Assessment and Plan   #COVID 19- had in late August/early September. Got from wife who had traveled. No fever, some throat discomfort, headache(s). Positive for 10 days.   #floaters-  one in right eye and 1 less visible on left- plans to see eye doctor- had tripped  into door frame and small cut over left eye was able to use butterflys to keep it closed/help it heal  #CAD - follows with Dr. Eden Emms #hyperlipidemia S: Medication: aspirin 81 mg and rosuvastatin 40mg  as well as coreg 3.125 mg BID   -zetia very expensive on his plan and he has declined -walked 870 miles this year. Feels could go back to [facility]) [employer]- some leg weakness- uses arms to get out of chair tow ork around aches and pains -no chest pain or shortness of breath  Lab Results  Component Value Date   CHOL 179 09/09/2022   HDL 69.90 09/09/2022   LDLCALC 93 09/09/2022   LDLDIRECT 81.0 07/20/2018   TRIG 82.0 09/09/2022   CHOLHDL 3 09/09/2022  A/P:  coronary artery disease asymptomatic continue current medications Lipids above goal but zetia costly- continue current medications    #hypertension- slight white coat element S: medication: Carvedilol 3.125 mg twice daily, losartan hydrochlorothiazide 100-25 mg, amlodipine 5 mg Home readings #s:  typically 130s/80's but rarely checks  BP Readings from Last 3 Encounters:  06/03/23 124/80  09/09/22 132/80  09/01/22 138/88  A/P: stable- continue current medicines    # Hyperglycemia/insulin resistance/prediabetes-peak A1c 6.6x1.  With diagnose of diabetes if had another score of 6.5 or above S:  Medication: Metformin 500 mg daily Exercise and diet- weight down 2 lbs from last visit.  Lab Results  Component Value Date   HGBA1C 6.4 09/09/2022   HGBA1C 6.3 03/26/2021   HGBA1C 6.3 (H) 03/06/2020  A/P: hopefully stable- update a1c next visit (prefers to wait on this). Continue current meds for now    # very sparing indomethacin for msk  Pain such as knees-through orthopedist or [place]. He is aware of slight increased CV risk with this but uses less than once a month- needs refill as old expired. With how sparing this is- last prescription expired- think we can do the CMP yearly  # fatty liver- mild elevations in LFTs in past- better in 2019 with weight loss. 3 glasses of red wine per day- have encouraged max 2 per day . Lost weight during COVID pandemic with holding off on wine- this is an interesting idea to cut back for multiple reasons.   # MUSCULOSKELETAL - Right leg cramp: had cramp last night and achilles tendon was tender on that side. - did hot shower this morning and pain is much   - switched to new balance  shoes and having less foot pain as  result was doing hiking boots- calloses have improved -not hiking as much due to some upper leg weakness- may get back into [employer] and then as strength better try to get back out and hike  Recommended follow up: Return in about 5 months (around  11/01/2023) for physical or sooner if needed.Schedule b4 you leave. Future Appointments  Date Time Provider Department Center  07/08/2023  1:30 PM [facility] ANNUAL WELLNESS VISIT 1 [facility]    Lab/Order associations:   ICD-10-CM   1. Essential hypertension  I10     2. Hyperlipidemia, unspecified hyperlipidemia type  E78.5     3. Hyperglycemia, unspecified  R73.9     4. Screening for diabetes mellitus  Z13.1       No orders of the defined types were placed in this encounter.   Return precautions advised.  Tana Conch, MD

## 2023-07-31 ENCOUNTER — Other Ambulatory Visit: Payer: Self-pay | Admitting: Cardiovascular Disease

## 2023-08-18 ENCOUNTER — Ambulatory Visit: Payer: Self-pay | Admitting: Family Medicine

## 2023-08-18 ENCOUNTER — Encounter (HOSPITAL_BASED_OUTPATIENT_CLINIC_OR_DEPARTMENT_OTHER): Payer: Self-pay | Admitting: Family Medicine

## 2023-08-18 ENCOUNTER — Ambulatory Visit (INDEPENDENT_AMBULATORY_CARE_PROVIDER_SITE_OTHER): Payer: Medicare HMO | Admitting: Family Medicine

## 2023-08-18 VITALS — BP 142/88 | HR 82 | Ht 73.0 in | Wt 283.7 lb

## 2023-08-18 DIAGNOSIS — J0100 Acute maxillary sinusitis, unspecified: Secondary | ICD-10-CM

## 2023-08-18 MED ORDER — DOXYCYCLINE HYCLATE 100 MG PO TABS: 100.0000 mg | ORAL_TABLET | Freq: Two times a day (BID) | ORAL | 0 refills | Status: DC

## 2023-08-18 NOTE — Progress Notes (Signed)
Acute Care Office Visit  Subjective:   Shawn Anderson 12/14/1950 08/18/2023  Chief Complaint  Patient presents with   Eye Pain    Patient has a sore spot on the outside of his left eye and also has some swelling. Also states he has been having some sinus problems on the left side.    HPI:  Patient states a few days ago he notices area that was "sore to the touch and swollen" near left eye.  He reports new onset of nasal congestion, sneezing, postnasal drip, nasal drainage in the past week.  He used a leftover prescription of amoxicillin last week and had improvement of his symptoms.  Symptoms returned this week with left-sided nasal congestion, postnasal drip and sinus tenderness to the left periorbital area.  He denied possible trauma, fall, insect bite, break in skin to left periorbital area in the past 1 to 2 weeks.  Denies fever or chills.  He has been using Vicks VapoRub and saline rinses with only mild improvement.  He states that the swelling and tenderness to the left side of his face did improve after a hot shower this morning  The following portions of the patient's history were reviewed and updated as appropriate: past medical history, past surgical history, family history, social history, allergies, medications, and problem list.   Patient Active Problem List   Diagnosis Date Noted   Benign neoplasm of descending colon    Hyperglycemia, unspecified 12/04/2019   Pain in right knee 01/13/2018   Plantar fasciitis    Left hip pain 11/20/2014   Dermatitis 11/20/2014   History of colonic polyps 11/20/2013   Morbid obesity, unspecified obesity type (HCC) 06/13/2013   S/P right THA, AA 06/12/2013   Left foot pain 12/19/2012   Fatty liver 12/19/2012   Actinic keratosis 08/03/2012   Gout 09/01/2009   Hyperlipidemia 02/21/2007   Essential hypertension 02/21/2007   CAD (coronary artery disease) 02/21/2007   Kidney stone 02/21/2007   Past Medical History:  Diagnosis  Date   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 Eden Emms   Diabetes mellitus without complication (HCC)    Foot swelling    Gout    History of kidney stones    Hyperlipidemia    Hypertension    Myocardial infarction (HCC) 1997   Nocturia    Obesity    Patellar tendonitis    Personal history of colonic polyp-adenoma  11/20/2013   Plantar fasciitis    Past Surgical History:  Procedure Laterality Date   APPENDECTOMY     COLONOSCOPY WITH PROPOFOL  11/14/2013   Dr.Gessner   COLONOSCOPY WITH PROPOFOL N/A 08/11/2021   Procedure: COLONOSCOPY WITH PROPOFOL;  Surgeon: Iva Boop, MD;  Location: [facility];  Service: Endoscopy;  Laterality: N/A;   CORONARY ARTERY BYPASS GRAFT  1997   3 VESSELS   HEMICOLECTOMY Right 2007   low grade mucnous appendiceal neoplasm   HERNIA REPAIR     INGUINAL HERNIA REPAIR Right 03/17/2020   Procedure: RIGHT INGUINAL HERNIA REPAIR;  Surgeon: Axel Filler, MD;  Location: [facility] OR;  Service: General;  Laterality: Right;   INSERTION OF MESH Right 03/17/2020   Procedure: INSERTION OF MESH;  Surgeon: Axel Filler, MD;  Location: [facility] OR;  Service: General;  Laterality: Right;   JOINT REPLACEMENT Right 2014   POLYPECTOMY  08/11/2021   Procedure: POLYPECTOMY;  Surgeon:  Iva Boop, MD;  Location: [facility];  Service: Endoscopy;;   TONSILLECTOMY     TOTAL HIP ARTHROPLASTY Right 06/12/2013   Procedure: RIGHT TOTAL HIP ARTHROPLASTY ANTERIOR APPROACH;  Surgeon: Shelda Pal, MD;  Location: [facility];  Service: Orthopedics;  Laterality: Right;   Family History  Problem Relation Age of Onset   Stroke Mother    Heart disease Mother    Aneurysm Father    Colon cancer Neg Hx    Esophageal cancer Neg Hx    Rectal cancer Neg Hx    Stomach cancer Neg Hx    Outpatient Medications Prior to Visit  Medication Sig Dispense Refill   amLODipine (NORVASC) 5 MG tablet TAKE 1 TABLET BY MOUTH  DAILY 60 tablet 0   amoxicillin (AMOXIL) 500 MG capsule Take 500 mg by mouth. Pt takes 4 tablets 1 hour prior to dental appointments.     aspirin EC 81 MG tablet Take 1 tablet (81 mg total) by mouth daily. 90 tablet 3   carvedilol (COREG) 3.125 MG tablet Take 1 tablet (3.125 mg total) by mouth 2 (two) times daily with a meal. 180 tablet 3   losartan-hydrochlorothiazide (HYZAAR) 100-25 MG tablet Take 1 tablet by mouth daily. 90 tablet 3   metFORMIN (GLUCOPHAGE) 500 MG tablet TAKE 1 TABLET BY MOUTH DAILY WITH BREAKFAST 100 tablet 3   rosuvastatin (CRESTOR) 40 MG tablet TAKE ONE TABLET BY MOUTH DAILY 90 tablet 3   indomethacin (INDOCIN) 25 MG capsule indomethacin 25 mg capsule  TAKE TWO CAPSULES PER DAY AFTER MEALS AS NEEDED for pain (Patient not taking: Reported on 08/18/2023) 60 capsule 3   nitroGLYCERIN (NITROSTAT) 0.4 MG SL tablet Place 1 tablet (0.4 mg total) under the tongue every 5 (five) minutes as needed for chest pain. (Patient not taking: Reported on 08/18/2023) 25 tablet 3   No facility-administered medications prior to visit.   Allergies  Allergen Reactions   Bee Venom Anaphylaxis    Due to heart medicine    Gadolinium Derivatives Shortness Of Breath, Itching, Swelling, Cough and Anaphylaxis    Symptoms include: SOB, wheezing, throat numbness, cough, congestion of nasal cavities, itching of hand and foot, and swelling. Contrast agent given- MultiHance.   Poison Ivy Extract Other (See Comments)    Sometimes     ROS: A complete ROS was performed with pertinent positives/negatives noted in the HPI. The remainder of the ROS are negative.    Objective:   Today's Vitals   08/18/23 1115 08/18/23 1135  BP: (!) 158/104 (!) 142/88  Pulse: 82   SpO2: 98%   Weight: 283 lb 11.2 oz (128.7 kg)   Height: 6\' 1"  (1.854 m)     GENERAL: Well-appearing, in NAD. Well nourished.  SKIN: Pink, warm and dry. No rash, lesion, ulceration, or ecchymoses.  Head: Normocephalic. NECK: Trachea midline.  Full ROM w/o pain or tenderness. No lymphadenopathy.  EARS: Tympanic membranes are intact, translucent without bulging and without drainage. Appropriate landmarks visualized.  EYES: Conjunctiva clear without exudates. EOMI, PERRL, no drainage present.  NOSE: Septum midline w/o deformity. Nares patent, mucosa pink and left-sided turbinates moderately inflamed w/ clear drainage.  Mild maxillary and frontal sinus tenderness to the left side of face present.  Mild edema present to left side of face under the left eye. THROAT: Uvula midline. Oropharynx clear. Mucous membranes pink and moist.  RESPIRATORY: Chest wall symmetrical. Respirations even and non-labored. Breath sounds clear to auscultation bilaterally.  CARDIAC: S1, S2 present, regular rate and  rhythm without murmur or gallops. Peripheral pulses 2+ bilaterally.  MSK: Muscle tone and strength appropriate for age.  NEUROLOGIC: No motor or sensory deficits. Steady, even gait. C2-C12 intact.  PSYCH/MENTAL STATUS: Alert, oriented x 3. Cooperative, appropriate mood and affect.      Assessment & Plan:  1. Acute non-recurrent maxillary sinusitis (Primary) Maxillary sinusitis with mild edema present on exam.  Not as concerned for possible periorbital cellulitis as patient HPI inconsistent with recent trauma, insect bite, or trigger that could be causing cellulitis of the face.  Will start patient on doxycycline after an effective trial of amoxicillin per patient.  If no improvement or worsening within 24 hours patient will reach out to myself or PCP.  Patient will also start nasal steroid such as Flonase, and use antihistamine such as Claritin or Zyrtec for nasal drainage.  Continue with sinus rinses as directed.  Recommend compresses if needed for pain relief as well.   Meds ordered this encounter  Medications   doxycycline (VIBRA-TABS) 100 MG tablet    Sig: Take 1 tablet (100 mg total) by mouth 2 (two) times daily.    Dispense:  14 tablet     Refill:  0    Supervising Provider:   DE Peru, RAYMOND J [[id number]]   Return if symptoms worsen or fail to improve.    Patient to reach out to office if new, worrisome, or unresolved symptoms arise or if no improvement in patient's condition. Patient verbalized understanding and is agreeable to treatment plan. All questions answered to patient's satisfaction.    Hilbert Bible, [place]

## 2023-08-18 NOTE — Telephone Encounter (Signed)
Copied from CRM [id number]. Topic: Clinical - Red Word Triage >> Aug 18, 2023  9:11 AM Leavy Cella D wrote: Red Word that prompted transfer to Nurse Triage: Patient stated he believes he has a possible sinus infection . Has severe swelling under his eyes  Chief Complaint: Swelling under left eye Symptoms: Sinus pressure Frequency: Two days Pertinent Negatives: Patient denies fever Disposition: [] ED /[] Urgent Care (no appt availability in office) / [x] Appointment(In office/virtual)/ []  [facility]/ [] Home Care/ [] Refused Recommended Disposition /[] [facility]/ []  Follow-up with PCP Additional Notes: Patient called in to report swelling under his left eye along his cheekbone. Patient stated he feels pain when he applies pressure to this area. Patient stated his sinuses feel swollen and his sinuses were congested a few weeks ago. Patient stated that his nose frequently runs every time he goes outside. Patient denied fever, sore throat, and cough. Agent scheduled appointment for patient in office tomorrow, prior to transferring to this RN. Advised patient to be seen within 4 hours, due to swelling around eye and lack of fever. No availability in patient's current PCP office. Scheduled patient for today at different office. Advised patient to call back if symptoms worsen. Patient complied.   Reason for Disposition  [1] Redness or swelling on the cheek, forehead or around the eye AND [2] no fever  Answer Assessment - Initial Assessment Questions 1. LOCATION: "Where does it hurt?"      Under left eye, along cheekbone  2. ONSET: "When did the sinus pain start?"  (e.g., hours, days)      Pain started yesterday and swelling started today  3. SEVERITY: "How bad is the pain?"   (Scale 1-10; mild, moderate or severe)   - MILD (1-3): doesn't interfere with normal activities    - MODERATE (4-7): interferes with normal activities (e.g., work or school) or awakens from sleep   - SEVERE  (8-10): excruciating pain and patient unable to do any normal activities        States area is uncomfortable and painful when applies pressure  4. RECURRENT SYMPTOM: "Have you ever had sinus problems before?" If Yes, ask: "When was the last time?" and "What happened that time?"      Denies  5. NASAL CONGESTION: "Is the nose blocked?" If Yes, ask: "Can you open it or must you breathe through your mouth?"     Patient states his sinuses feel swollen, patient states sinuses were stuffy a few weeks ago, patient states his nose runs frequently when he goes outside  6. NASAL DISCHARGE: "Do you have discharge from your nose?" If so ask, "What color?"     States his nose is runny when he goes outside  7. FEVER: "Do you have a fever?" If Yes, ask: "What is it, how was it measured, and when did it start?"      Denies  8. OTHER SYMPTOMS: "Do you have any other symptoms?" (e.g., sore throat, cough, earache, difficulty breathing)     Soreness and swelling under left eye along cheekbone, sinuses were stuffy a few weeks ago, denies other symptoms  Protocols used: Sinus Pain or Congestion-A-AH

## 2023-08-18 NOTE — Patient Instructions (Addendum)
If no improvement in 24 hours, please call the office.   Start Zyrtec, Claritin or Allegra as well.

## 2023-08-19 ENCOUNTER — Ambulatory Visit: Payer: Medicare HMO | Admitting: Physician Assistant

## 2023-08-21 ENCOUNTER — Other Ambulatory Visit: Payer: Self-pay | Admitting: Cardiovascular Disease

## 2023-10-04 ENCOUNTER — Other Ambulatory Visit: Payer: Self-pay | Admitting: Cardiovascular Disease

## 2023-10-21 DIAGNOSIS — Z0100 Encounter for examination of eyes and vision without abnormal findings: Secondary | ICD-10-CM | POA: Diagnosis not present

## 2023-10-21 DIAGNOSIS — H5213 Myopia, bilateral: Secondary | ICD-10-CM | POA: Diagnosis not present

## 2023-10-24 ENCOUNTER — Ambulatory Visit (INDEPENDENT_AMBULATORY_CARE_PROVIDER_SITE_OTHER)

## 2023-10-24 VITALS — Ht 73.0 in | Wt 268.0 lb

## 2023-10-24 DIAGNOSIS — Z0000 Encounter for general adult medical examination without abnormal findings: Secondary | ICD-10-CM | POA: Diagnosis not present

## 2023-10-24 NOTE — Patient Instructions (Signed)
 [name] , Thank you for taking time to come for your Medicare Wellness Visit. I appreciate your ongoing commitment to your health goals. Please review the following plan we discussed and let me know if I can assist you in the future.   Referrals/Orders/Follow-Ups/Clinician Recommendations: continue working on losing weight   This is a list of the screening recommended for you and due dates:  Health Maintenance  Topic Date Due   Complete foot exam   Never done   Eye exam for diabetics  Never done   Zoster (Shingles) Vaccine (1 of 2) 04/11/1970   Hemoglobin A1C  03/10/2023   COVID-19 Vaccine (6 - 2024-25 season) 04/03/2023   Yearly kidney function blood test for diabetes  09/10/2023   Yearly kidney health urinalysis for diabetes  09/10/2023   Flu Shot  10/31/2023*   DTaP/Tdap/Td vaccine (3 - Td or Tdap) 06/02/2024*   Medicare Annual Wellness Visit  10/23/2024   Colon Cancer Screening  08/11/2026   Pneumonia Vaccine  Completed   Hepatitis C Screening  Completed   HPV Vaccine  Aged Out  *Topic was postponed. The date shown is not the original due date.    Advanced directives: (Copy Requested) Please bring a copy of your health care power of attorney and living will to the office to be added to your chart at your convenience. You can mail to [facility] [address] [place], [place] [address] or email to [email or web address]  Next Medicare Annual Wellness Visit scheduled for next year: Yes

## 2023-10-24 NOTE — Progress Notes (Signed)
 Subjective:   Shawn Anderson is a 73 y.o. who presents for a Medicare Wellness preventive visit.  Visit Complete: Virtual I connected with  Shawn Anderson on 10/24/23 by a audio enabled telemedicine application and verified that I am speaking with the correct person using two identifiers.  Patient Location: Home  Provider Location: Office/Clinic  I discussed the limitations of evaluation and management by telemedicine. The patient expressed understanding and agreed to proceed.  Vital Signs: Because this visit was a virtual/telehealth visit, some criteria may be missing or patient reported. Any vitals not documented were not able to be obtained and vitals that have been documented are patient reported.  VideoDeclined- This patient declined Librarian, academic. Therefore the visit was completed with audio only.  Persons Participating in Visit: Patient.  AWV Questionnaire: No: Patient Medicare AWV questionnaire was not completed prior to this visit.  Cardiac Risk Factors include: advanced age (>55men, >34 women);dyslipidemia;male gender;hypertension;obesity (BMI >30kg/m2)     Objective:    Today's Vitals   10/24/23 1028  Weight: 268 lb (121.6 kg)  Height: 6\' 1"  (1.854 m)   Body mass index is 35.36 kg/m.     10/24/2023   10:33 AM 07/02/2022    1:10 PM 08/11/2021    7:47 AM 03/06/2020    2:53 PM 06/12/2013    3:50 PM 06/05/2013    9:27 AM  Advanced Directives  Does Patient Have a Medical Advance Directive? Yes Yes No Yes Patient does not have advance directive;Patient would not like information Patient does not have advance directive;Patient would not like information  Type of Public librarian Power of [place];Living will Healthcare Power of [place];Living will  Living will    Does patient want to make changes to medical advance directive?    No - Patient declined    Copy of Healthcare Power of Attorney in Chart? No - copy requested No -  copy requested      Pre-existing out of facility DNR order (yellow form or pink MOST form)     Yellow form placed in chart (order not valid for inpatient use)     Current Medications (verified) Outpatient Encounter Medications as of 10/24/2023  Medication Sig   amLODipine (NORVASC) 5 MG tablet TAKE 1 TABLET BY MOUTH DAILY   aspirin EC 81 MG tablet Take 1 tablet (81 mg total) by mouth daily.   carvedilol (COREG) 3.125 MG tablet TAKE 1 TABLET BY MOUTH TWICE A DAY WITH A MEAL   FLUZONE HIGH-DOSE 0.5 ML injection    losartan-hydrochlorothiazide (HYZAAR) 100-25 MG tablet TAKE 1 TABLET BY MOUTH DAILY   metFORMIN (GLUCOPHAGE) 500 MG tablet TAKE 1 TABLET BY MOUTH DAILY WITH BREAKFAST   rosuvastatin (CRESTOR) 40 MG tablet TAKE ONE TABLET BY MOUTH DAILY   amoxicillin (AMOXIL) 500 MG capsule Take 500 mg by mouth. Pt takes 4 tablets 1 hour prior to dental appointments. (Patient not taking: Reported on 10/24/2023)   indomethacin (INDOCIN) 25 MG capsule indomethacin 25 mg capsule  TAKE TWO CAPSULES PER DAY AFTER MEALS AS NEEDED for pain (Patient not taking: Reported on 10/24/2023)   nitroGLYCERIN (NITROSTAT) 0.4 MG SL tablet Place 1 tablet (0.4 mg total) under the tongue every 5 (five) minutes as needed for chest pain. (Patient not taking: Reported on 10/24/2023)   [DISCONTINUED] doxycycline (VIBRA-TABS) 100 MG tablet Take 1 tablet (100 mg total) by mouth 2 (two) times daily.   No facility-administered encounter medications on file as of 10/24/2023.  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 Eden Emms   Diabetes mellitus without complication [facility])    Foot swelling    Gout    History of kidney stones    Hyperlipidemia    Hypertension    Myocardial infarction (HCC) 1997   Nocturia    Obesity     Patellar tendonitis    Personal history of colonic polyp-adenoma  11/20/2013   Plantar fasciitis    Past Surgical History:  Procedure Laterality Date   APPENDECTOMY     COLONOSCOPY WITH PROPOFOL  11/14/2013   Dr.Gessner   COLONOSCOPY WITH PROPOFOL N/A 08/11/2021   Procedure: COLONOSCOPY WITH PROPOFOL;  Surgeon: Iva Boop, MD;  Location: [facility];  Service: Endoscopy;  Laterality: N/A;   CORONARY ARTERY BYPASS GRAFT  1997   3 VESSELS   HEMICOLECTOMY Right 2007   low grade mucnous appendiceal neoplasm   HERNIA REPAIR     INGUINAL HERNIA REPAIR Right 03/17/2020   Procedure: RIGHT INGUINAL HERNIA REPAIR;  Surgeon: Axel Filler, MD;  Location: [facility] OR;  Service: General;  Laterality: Right;   INSERTION OF MESH Right 03/17/2020   Procedure: INSERTION OF MESH;  Surgeon: Axel Filler, MD;  Location: [facility] OR;  Service: General;  Laterality: Right;   JOINT REPLACEMENT Right 2014   POLYPECTOMY  08/11/2021   Procedure: POLYPECTOMY;  Surgeon: Iva Boop, MD;  Location: [facility];  Service: Endoscopy;;   TONSILLECTOMY     TOTAL HIP ARTHROPLASTY Right 06/12/2013   Procedure: RIGHT TOTAL HIP ARTHROPLASTY ANTERIOR APPROACH;  Surgeon: Shelda Pal, MD;  Location: [facility]S;  Service: Orthopedics;  Laterality: Right;   Family History  Problem Relation Age of Onset   Stroke Mother    Heart disease Mother    Aneurysm Father    Colon cancer Neg Hx    Esophageal cancer Neg Hx    Rectal cancer Neg Hx    Stomach cancer Neg Hx    Social History   Socioeconomic History   Marital status: Married    Spouse name: Not on file   Number of children: Not on file   Years of education: Not on file   Highest education level: Not on file  Occupational History   Occupation: Magazine features editor: [employer]    Comment: Software engineer  Tobacco Use   Smoking status: Never   Smokeless tobacco: Never  Vaping Use   Vaping status: Never Used  Substance and Sexual Activity    Alcohol use: Yes    Alcohol/week: 14.0 - 21.0 standard drinks of alcohol    Types: 14 - 21 Glasses of wine per week    Comment: 2-3 glasses wine daily   Drug use: No   Sexual activity: Yes  Other Topics Concern   Not on file  Social History Narrative   Married. No kids. 1 stapdaughter 31- no grandkids. 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       Hobbies: enjoys walking (plantar fasciitis inhibits and also patellar tendonitis), trout fishing years ago   Social Drivers of Corporate investment banker Strain: Low Risk  (10/24/2023)   Overall Financial Resource  Strain (CARDIA)    Difficulty of Paying Living Expenses: Not hard at all  Food Insecurity: No Food Insecurity (10/24/2023)   Hunger Vital Sign    Worried About Running Out of Food in the Last Year: Never true    Ran Out of Food in the Last Year: Never true  Transportation Needs: No Transportation Needs (10/24/2023)   PRAPARE - Administrator, Civil Service (Medical): No    Lack of Transportation (Non-Medical): No  Physical Activity: Sufficiently Active (10/24/2023)   Exercise Vital Sign    Days of Exercise per Week: 7 days    Minutes of Exercise per Session: 60 min  Stress: No Stress Concern Present (10/24/2023)   [employer] of Occupational Health - Occupational Stress Questionnaire    Feeling of Stress : Not at all  Social Connections: Moderately Isolated (10/24/2023)   Social Connection and Isolation Panel [NHANES]    Frequency of Communication with Friends and Family: More than three times a week    Frequency of Social Gatherings with Friends and Family: More than three times a week    Attends Religious Services: Never    Database administrator or Organizations: No    Attends Engineer, structural: Never    Marital Status: Married    Tobacco Counseling Counseling  given: Not Answered    Clinical Intake:  Pre-visit preparation completed: Yes  Pain : No/denies pain     BMI - recorded: 35.36 Nutritional Status: BMI > 30  Obese Nutritional Risks: None Diabetes: Yes CBG done?: No Did pt. bring in CBG monitor from home?: No  Lab Results  Component Value Date   HGBA1C 6.4 09/09/2022   HGBA1C 6.3 03/26/2021   HGBA1C 6.3 (H) 03/06/2020     How often do you need to have someone help you when you read instructions, pamphlets, or other written materials from your doctor or pharmacy?: 1 - Never  Interpreter Needed?: No  Information entered by :: Lanier Ensign, LPN   Activities of Daily Living     10/24/2023   10:30 AM  In your present state of health, do you have any difficulty performing the following activities:  Hearing? 0  Vision? 0  Difficulty concentrating or making decisions? 0  Walking or climbing stairs? 0  Dressing or bathing? 0  Doing errands, shopping? 0  Preparing Food and eating ? N  Using the Toilet? N  In the past six months, have you accidently leaked urine? N  Do you have problems with loss of bowel control? N  Managing your Medications? N  Managing your Finances? N  Housekeeping or managing your Housekeeping? N    Patient Care Team: Shelva Majestic, MD as PCP - General (Family Medicine) Wendall Stade, MD as PCP - Cardiology (Cardiology) Dahlia Byes, [facility] as Pharmacist (Pharmacist)  Indicate any recent Medical Services you may have received from other than [facility] providers in the past year (date may be approximate).     Assessment:   This is a routine wellness examination for Shawn Anderson.  Hearing/Vision screen Hearing Screening - Comments:: Pt denies any hearing issues  Vision Screening - Comments:: Pt [facility] for annual [facility] exams    Goals Addressed             This Visit's Progress    Patient Stated       Continue to lose weight        Depression Screen     10/24/2023  10:33 AM 08/18/2023   11:21 AM 09/09/2022    1:59 PM 07/02/2022    1:07 PM 03/31/2021    2:44 PM 12/04/2019   10:48 AM 03/14/2018    1:48 PM  PHQ 2/9 Scores  PHQ - 2 Score 0 0 0 0 0 0 0  PHQ- 9 Score  0 0   0     Fall Risk     10/24/2023   10:34 AM 08/18/2023   11:20 AM 09/09/2022    1:59 PM 07/02/2022    1:11 PM 03/31/2021    2:44 PM  Fall Risk   Falls in the past year? 0 1 0 0 0  Number falls in past yr: 0 0 0 0 0  Injury with Fall? 0 1 0 0 0  Risk for fall due to : No Fall Risks Impaired balance/gait No Fall Risks Impaired vision No Fall Risks  Follow up Falls prevention discussed Falls evaluation completed Falls evaluation completed Falls prevention discussed Falls evaluation completed    MEDICARE RISK AT HOME:  Medicare Risk at Home Any stairs in or around the home?: Yes If so, are there any without handrails?: No Home free of loose throw rugs in walkways, pet beds, electrical cords, etc?: Yes Adequate lighting in your home to reduce risk of falls?: Yes Life alert?: No Use of a cane, walker or w/c?: No Grab bars in the bathroom?: No Shower chair or bench in shower?: No Elevated toilet seat or a handicapped toilet?: No  TIMED UP AND GO:  Was the test performed?  No  Cognitive Function: 6CIT completed        10/24/2023   10:30 AM 07/02/2022    1:11 PM 12/04/2019   10:49 AM  6CIT Screen  What Year? 0 points 0 points 0 points  What month? 0 points 0 points 0 points  What time? 0 points 0 points 0 points  Count back from 20 0 points 0 points 0 points  Months in reverse 0 points 0 points 0 points  Repeat phrase 0 points 0 points 0 points  Total Score 0 points 0 points 0 points    Immunizations Immunization History  Administered Date(s) Administered   Influenza-Unspecified 06/24/2023   PFIZER(Purple Top)SARS-COV-2 Vaccination 09/06/2019, 09/27/2019, 05/02/2020, 03/24/2021   Pfizer Covid-19 Vaccine Bivalent Booster 43yrs & up 07/17/2021   Pneumococcal Conjugate-13  11/20/2014   Pneumococcal Polysaccharide-23 09/27/2016   Pneumococcal-Unspecified 08/03/2015   Tdap 01/29/2008, 02/26/2013   Zoster, Live 08/22/2013    Screening Tests Health Maintenance  Topic Date Due   FOOT EXAM  Never done   OPHTHALMOLOGY EXAM  Never done   Zoster Vaccines- Shingrix (1 of 2) 04/11/1970   HEMOGLOBIN A1C  03/10/2023   COVID-19 Vaccine (6 - 2024-25 season) 04/03/2023   Diabetic kidney evaluation - eGFR measurement  09/10/2023   Diabetic kidney evaluation - Urine ACR  09/10/2023   DTaP/Tdap/Td (3 - Td or Tdap) 06/02/2024 (Originally 02/27/2023)   Medicare Annual Wellness (AWV)  10/23/2024   Colonoscopy  08/11/2026   Pneumonia Vaccine 57+ Years old  Completed   INFLUENZA VACCINE  Completed   Hepatitis C Screening  Completed   HPV VACCINES  Aged Out    Health Maintenance  Health Maintenance Due  Topic Date Due   FOOT EXAM  Never done   OPHTHALMOLOGY EXAM  Never done   Zoster Vaccines- Shingrix (1 of 2) 04/11/1970   HEMOGLOBIN A1C  03/10/2023   COVID-19 Vaccine (6 - 2024-25 season) 04/03/2023  Diabetic kidney evaluation - eGFR measurement  09/10/2023   Diabetic kidney evaluation - Urine ACR  09/10/2023   Health Maintenance Items Addressed: See Nurse Notes  Additional Screening:  Vision Screening: Recommended annual ophthalmology exams for early detection of glaucoma and other disorders of the eye.  Dental Screening: Recommended annual dental exams for proper oral hygiene  Community Resource Referral / Chronic Care Management: CRR required this visit?  No   CCM required this visit?  No     Plan:     I have personally reviewed and noted the following in the patient's chart:   Medical and social history Use of alcohol, tobacco or illicit drugs  Current medications and supplements including opioid prescriptions. Patient is not currently taking opioid prescriptions. Functional ability and status Nutritional status Physical activity Advanced  directives List of other physicians Hospitalizations, surgeries, and ER visits in previous 12 months Vitals Screenings to include cognitive, depression, and falls Referrals and appointments  In addition, I have reviewed and discussed with patient certain preventive protocols, quality metrics, and best practice recommendations. A written personalized care plan for preventive services as well as general preventive health recommendations were provided to patient.     Shawn Schlein, LPN   1/61/0960   After Visit Summary: (MyChart) Due to this being a telephonic visit, the after visit summary with patients personalized plan was offered to patient via MyChart   Notes: Nothing significant to report at this time.

## 2023-10-25 ENCOUNTER — Other Ambulatory Visit: Payer: Self-pay | Admitting: Family Medicine

## 2023-11-01 ENCOUNTER — Other Ambulatory Visit: Payer: Self-pay | Admitting: Cardiovascular Disease

## 2023-11-01 ENCOUNTER — Ambulatory Visit: Payer: Medicare HMO | Admitting: Family Medicine

## 2023-11-08 NOTE — Progress Notes (Signed)
 Cardiology Office Note    Date:  11/16/2023   ID:  Shawn Anderson, DOB 11-Aug-1950, MRN [id number]  PCP:  Almira Jaeger, MD  Cardiologist: Dr. Stann Earnest  CC: follow up   History of Present Illness:   73 y.o. CAD/CABG 1997 HTN and Gout. Last myovue 04/29/15 inferior scar no ischemia. Cardiac MRI 05/22/15 EF 42% so no AICD. Had reaction to gadolinium with hives and pruritis so should not have again. HTN Rx coreg and norvasc ? Some neck pain when on ARB Needs to be reminded to take ASA daily. Probable gouty knee arthritis better with allopurinol  Walking trails by [employer] a lot.   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 Stann Earnest   Diabetes mellitus without complication [facility])    Foot swelling    Gout    History of kidney stones    Hyperlipidemia    Hypertension    Myocardial infarction (HCC) 1997   Nocturia    Obesity    Patellar tendonitis    Personal history of colonic polyp-adenoma  11/20/2013   Plantar fasciitis     Past Surgical History:  Procedure Laterality Date   APPENDECTOMY     COLONOSCOPY WITH PROPOFOL  11/14/2013   Dr.Gessner   COLONOSCOPY WITH PROPOFOL N/A 08/11/2021   Procedure: COLONOSCOPY WITH PROPOFOL;  Surgeon: Kenney Peacemaker, MD;  [facility];  Service: Endoscopy;  Laterality: N/A;   CORONARY ARTERY BYPASS GRAFT  1997   3 VESSELS   HEMICOLECTOMY Right 2007   low grade mucnous  appendiceal neoplasm   HERNIA REPAIR     INGUINAL HERNIA REPAIR Right 03/17/2020   Procedure: RIGHT INGUINAL HERNIA REPAIR;  Surgeon: Shela Derby, MD;  Location: [facility] OR;  Service: General;  Laterality: Right;   INSERTION OF MESH Right 03/17/2020   Procedure: INSERTION OF MESH;  Surgeon: Shela Derby, MD;  Location: [facility] OR;  Service: General;  Laterality: Right;   JOINT REPLACEMENT Right 2014   POLYPECTOMY  08/11/2021   Procedure: POLYPECTOMY;  Surgeon: Kenney Peacemaker, MD;  [facility];  Service: Endoscopy;;   TONSILLECTOMY     TOTAL HIP ARTHROPLASTY Right 06/12/2013   Procedure: RIGHT TOTAL HIP ARTHROPLASTY ANTERIOR APPROACH;  Surgeon: Bevin Bucks, MD;  [facility];  Service: Orthopedics;  Laterality: Right;    Current Medications: Outpatient Medications Prior to Visit  Medication Sig Dispense Refill   amLODipine (NORVASC) 5 MG tablet Take 1 tablet (5 mg total) by mouth daily. Please keep scheduled appointment for future refills. Thank you. 30 tablet 0   aspirin EC 81 MG tablet Take 1 tablet (81 mg total) by mouth daily. 90 tablet 3   carvedilol (COREG) 3.125 MG tablet TAKE 1 TABLET BY MOUTH TWICE A DAY WITH A MEAL 180 tablet 0   losartan-hydrochlorothiazide (HYZAAR) 100-25 MG tablet TAKE  1 TABLET BY MOUTH DAILY 90 tablet 0   metFORMIN (GLUCOPHAGE) 500 MG tablet TAKE 1 TABLET BY MOUTH DAILY WITH BREAKFAST 100 tablet 3   nitroGLYCERIN (NITROSTAT) 0.4 MG SL tablet Place 1 tablet (0.4 mg total) under the tongue every 5 (five) minutes as needed for chest pain. 25 tablet 3   rosuvastatin (CRESTOR) 40 MG tablet TAKE 1 TABLET BY MOUTH DAILY 90 tablet 3   amoxicillin (AMOXIL) 500 MG capsule Take 500 mg by mouth. Pt takes 4 tablets 1 hour prior to dental appointments. (Patient not taking: Reported on 11/16/2023)     indomethacin (INDOCIN) 25 MG capsule indomethacin 25 mg capsule  TAKE TWO CAPSULES PER DAY AFTER MEALS AS NEEDED for pain (Patient not taking: Reported on  11/16/2023) 60 capsule 3   No facility-administered medications prior to visit.     Allergies:   Bee venom, Gadolinium derivatives, and Poison ivy extract   Social History   Socioeconomic History   Marital status: Married    Spouse name: Not on file   Number of children: Not on file   Years of education: Not on file   Highest education level: Not on file  Occupational History   Occupation: Magazine features editor: [employer]    Comment: Software engineer  Tobacco Use   Smoking status: Never   Smokeless tobacco: Never  Vaping Use   Vaping status: Never Used  Substance and Sexual Activity   Alcohol use: Yes    Alcohol/week: 14.0 - 21.0 standard drinks of alcohol    Types: 14 - 21 Glasses of wine per week    Comment: 2-3 glasses wine daily   Drug use: No   Sexual activity: Yes  Other Topics Concern   Not on file  Social History Narrative   Married. No kids. 1 stapdaughter 31- no grandkids. 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       Hobbies: enjoys walking (plantar fasciitis inhibits and also patellar tendonitis), trout fishing years ago   Social Drivers of Corporate investment banker Strain: Low Risk  (10/24/2023)   Overall Financial Resource Strain (CARDIA)    Difficulty of Paying Living Expenses: Not hard at all  Food Insecurity: No Food Insecurity (10/24/2023)   Hunger Vital Sign    Worried About Running Out of Food in the Last Year: Never true    Ran Out of Food in the Last Year: Never true  Transportation Needs: No Transportation Needs (10/24/2023)   PRAPARE - Administrator, Civil Service (Medical): No    Lack of Transportation (Non-Medical): No  Physical Activity: Sufficiently Active (10/24/2023)   Exercise Vital Sign    Days of Exercise per Week: 7 days    Minutes of Exercise per Session: 60 min  Stress: No Stress  Concern Present (10/24/2023)   [employer] of Occupational Health - Occupational Stress Questionnaire    Feeling of Stress : Not at all  Social Connections: Moderately Isolated (10/24/2023)   Social Connection and Isolation Panel [NHANES]    Frequency of Communication with Friends and Family: More than three times a week    Frequency of Social Gatherings with Friends and Family: More than three times a week    Attends Religious Services: Never    Database administrator or Organizations: No    Attends Banker Meetings: Never  Marital Status: Married     Family History:  The patient's family history includes Aneurysm in his father; Heart disease in his mother; Stroke in his mother.      ROS:   Please see the history of present illness.    ROS All other systems reviewed and are negative.   PHYSICAL EXAM:   VS:  BP 120/82   Pulse 69   Ht 6\' 1"  (1.854 m)   Wt 272 lb (123.4 kg)   SpO2 94%   BMI 35.89 kg/m    Affect appropriate Overweight white male  HEENT: normal Neck supple with no adenopathy JVP normal no bruits no thyromegaly Lungs clear with no wheezing and good diaphragmatic motion Heart:  S1/S2 no murmur, no rub, gallop or click PMI normal post sternotomy  Abdomen: benighn, BS positve, no tenderness, no AAA no bruit.  No HSM or HJR right inguinal hernia repair  Distal pulses intact with no bruits No edema Neuro non-focal Skin warm and dry No muscular weakness    Wt Readings from Last 3 Encounters:  11/16/23 272 lb (123.4 kg)  10/24/23 268 lb (121.6 kg)  08/18/23 283 lb 11.2 oz (128.7 kg)      Studies/Labs Reviewed:   EKG:  SR rate 29 old IMI no acute changes  11/16/2023   Recent Labs: 11/11/2023: ALT 23; BUN 18; Creatinine, Ser 0.89; Hemoglobin 12.5; Platelets 194.0; Potassium 4.5; Sodium 135   Lipid Panel    Component Value Date/Time   CHOL 162 11/11/2023 1110   CHOL 181 08/25/2016 0848   TRIG 86.0 11/11/2023 1110   TRIG 269 (HH)  08/01/2006 1039   HDL 63.80 11/11/2023 1110   HDL 49 08/25/2016 0848   CHOLHDL 3 11/11/2023 1110   VLDL 17.2 11/11/2023 1110   LDLCALC 81 11/11/2023 1110   LDLCALC 92 08/25/2016 0848   LDLDIRECT 81.0 07/20/2018 0848    Additional studies/ records that were reviewed today include:  Myoview 04/2015 Study Highlights   The left ventricular ejection fraction is moderately decreased (30-44%). Nuclear stress EF: 37%. There was no ST segment deviation noted during stress. Defect 1: There is a large defect of severe severity present in the basal inferior, basal inferolateral, mid inferior, mid inferolateral, apical inferior and apical lateral location. Findings consistent with prior myocardial infarction. This is an intermediate risk study.      Cardiac MR 05/2015 IMPRESSION: 1) Moderate LV dilatation with severe inferior wall hypokinesis consistent with previous MI. EF 42% 2) Biatrial enlargement 3) Normal RV 4) No significant ischemic MR 5) Significant Gadolinium reaction. Patient should not receive again see Rx noted above   ASSESSMENT & PLAN:   CAD s/p CABG x3V (1997): Non ischemic myovue 2016 continue medical Rx  No cardiac issues with general anesthesia August 2021 continue statin, ASA and beta blocker  Walked /Hiked over 874 miles in 2024 no angina   Over weight : Discussed low carb diet Does walk a lot   HLD: continue statin. Crestor 40 mg most recent LDL in Epic 93 09/09/22 Zetia called in f/u labs primary  HTN: better with norvasc    Gout: continue indocin and allopurinol try not to escalate diuretic Rx  DM:  Discussed low carb diet.  Target hemoglobin A1c is 6.5 or less.  Continue current medications.A1c 6.4 09/09/22  Start jardiance 10 mg f/u primary    F/U in a year    Janelle Mediate

## 2023-11-11 ENCOUNTER — Ambulatory Visit (INDEPENDENT_AMBULATORY_CARE_PROVIDER_SITE_OTHER): Admitting: Family Medicine

## 2023-11-11 ENCOUNTER — Encounter: Payer: Self-pay | Admitting: Family Medicine

## 2023-11-11 VITALS — BP 130/80 | HR 74 | Temp 97.2°F | Ht 73.0 in

## 2023-11-11 DIAGNOSIS — R809 Proteinuria, unspecified: Secondary | ICD-10-CM | POA: Diagnosis not present

## 2023-11-11 DIAGNOSIS — Z125 Encounter for screening for malignant neoplasm of prostate: Secondary | ICD-10-CM

## 2023-11-11 DIAGNOSIS — I2510 Atherosclerotic heart disease of native coronary artery without angina pectoris: Secondary | ICD-10-CM | POA: Diagnosis not present

## 2023-11-11 DIAGNOSIS — R7303 Prediabetes: Secondary | ICD-10-CM | POA: Diagnosis not present

## 2023-11-11 DIAGNOSIS — I10 Essential (primary) hypertension: Secondary | ICD-10-CM | POA: Diagnosis not present

## 2023-11-11 DIAGNOSIS — E785 Hyperlipidemia, unspecified: Secondary | ICD-10-CM

## 2023-11-11 LAB — MICROALBUMIN / CREATININE URINE RATIO
Creatinine,U: 73.1 mg/dL
Microalb Creat Ratio: 188.1 mg/g — ABNORMAL HIGH (ref 0.0–30.0)
Microalb, Ur: 13.8 mg/dL — ABNORMAL HIGH (ref 0.0–1.9)

## 2023-11-11 LAB — COMPREHENSIVE METABOLIC PANEL WITH GFR
ALT: 23 U/L (ref 0–53)
AST: 27 U/L (ref 0–37)
Albumin: 4.7 g/dL (ref 3.5–5.2)
Alkaline Phosphatase: 49 U/L (ref 39–117)
BUN: 18 mg/dL (ref 6–23)
CO2: 31 meq/L (ref 19–32)
Calcium: 9.6 mg/dL (ref 8.4–10.5)
Chloride: 96 meq/L (ref 96–112)
Creatinine, Ser: 0.89 mg/dL (ref 0.40–1.50)
GFR: 85.57 mL/min (ref >60.00)
Glucose, Bld: 113 mg/dL — ABNORMAL HIGH (ref 70–99)
Potassium: 4.5 meq/L (ref 3.5–5.1)
Sodium: 135 meq/L (ref 135–145)
Total Bilirubin: 0.9 mg/dL (ref 0.2–1.2)
Total Protein: 7.3 g/dL (ref 6.0–8.3)

## 2023-11-11 LAB — PSA, MEDICARE: PSA: 0.91 ng/mL (ref 0.10–4.00)

## 2023-11-11 LAB — LIPID PANEL
Cholesterol: 162 mg/dL (ref 0–200)
HDL: 63.8 mg/dL (ref >39.00)
LDL Cholesterol: 81 mg/dL (ref 0–99)
NonHDL: 98.46
Total CHOL/HDL Ratio: 3
Triglycerides: 86 mg/dL (ref 0.0–149.0)
VLDL: 17.2 mg/dL (ref 0.0–40.0)

## 2023-11-11 LAB — CBC WITH DIFFERENTIAL/PLATELET
Basophils Absolute: 0 10*3/uL (ref 0.0–0.1)
Basophils Relative: 0.5 % (ref 0.0–3.0)
Eosinophils Absolute: 0.1 10*3/uL (ref 0.0–0.7)
Eosinophils Relative: 2.1 % (ref 0.0–5.0)
HCT: 37.7 % — ABNORMAL LOW (ref 39.0–52.0)
Hemoglobin: 12.5 g/dL — ABNORMAL LOW (ref 13.0–17.0)
Lymphocytes Relative: 34.1 % (ref 12.0–46.0)
Lymphs Abs: 1.5 10*3/uL (ref 0.7–4.0)
MCHC: 33.2 g/dL (ref 30.0–36.0)
MCV: 98.8 fl (ref 78.0–100.0)
Monocytes Absolute: 0.5 10*3/uL (ref 0.1–1.0)
Monocytes Relative: 12.1 % — ABNORMAL HIGH (ref 3.0–12.0)
Neutro Abs: 2.3 10*3/uL (ref 1.4–7.7)
Neutrophils Relative %: 51.2 % (ref 43.0–77.0)
Platelets: 194 10*3/uL (ref 150.0–400.0)
RBC: 3.82 Mil/uL — ABNORMAL LOW (ref 4.22–5.81)
RDW: 15.4 % (ref 11.5–15.5)
WBC: 4.4 10*3/uL (ref 4.0–10.5)

## 2023-11-11 LAB — HEMOGLOBIN A1C: Hgb A1c MFr Bld: 6.5 % (ref 4.6–6.5)

## 2023-11-11 NOTE — Progress Notes (Signed)
 Phone [phone] In person visit   Subjective:   Shawn Anderson is a 73 y.o. year old very pleasant male patient who presents for/with See problem oriented charting Chief Complaint  Patient presents with   5 month f/u    DEE requested.   Hypertension    Past Medical History-  Patient Active Problem List   Diagnosis Date Noted   CAD (coronary artery disease) 02/21/2007    Priority: High   Hyperglycemia, unspecified 12/04/2019    Priority: Medium    Fatty liver 12/19/2012    Priority: Medium    Gout 09/01/2009    Priority: Medium    Hyperlipidemia 02/21/2007    Priority: Medium    Essential hypertension 02/21/2007    Priority: Medium    Plantar fasciitis     Priority: Low   Left hip pain 11/20/2014    Priority: Low   Dermatitis 11/20/2014    Priority: Low   History of colonic polyps 11/20/2013    Priority: Low   Morbid obesity, unspecified obesity type (HCC) 06/13/2013    Priority: Low   S/P right THA, AA 06/12/2013    Priority: Low   Left foot pain 12/19/2012    Priority: Low   Actinic keratosis 08/03/2012    Priority: Low   Kidney stone 02/21/2007    Priority: Low   Benign neoplasm of descending colon    Pain in right knee 01/13/2018    Medications- reviewed and updated Current Outpatient Medications  Medication Sig Dispense Refill   amLODipine (NORVASC) 5 MG tablet Take 1 tablet (5 mg total) by mouth daily. Please keep scheduled appointment for future refills. Thank you. 30 tablet 0   amoxicillin (AMOXIL) 500 MG capsule Take 500 mg by mouth. Pt takes 4 tablets 1 hour prior to dental appointments.     aspirin EC 81 MG tablet Take 1 tablet (81 mg total) by mouth daily. 90 tablet 3   carvedilol (COREG) 3.125 MG tablet TAKE 1 TABLET BY MOUTH TWICE A DAY WITH A MEAL 180 tablet 0   indomethacin (INDOCIN) 25 MG capsule indomethacin 25 mg capsule  TAKE TWO CAPSULES PER DAY AFTER MEALS AS NEEDED for pain 60 capsule 3   losartan-hydrochlorothiazide (HYZAAR) 100-25  MG tablet TAKE 1 TABLET BY MOUTH DAILY 90 tablet 0   metFORMIN (GLUCOPHAGE) 500 MG tablet TAKE 1 TABLET BY MOUTH DAILY WITH BREAKFAST 100 tablet 3   nitroGLYCERIN (NITROSTAT) 0.4 MG SL tablet Place 1 tablet (0.4 mg total) under the tongue every 5 (five) minutes as needed for chest pain. 25 tablet 3   rosuvastatin (CRESTOR) 40 MG tablet TAKE 1 TABLET BY MOUTH DAILY 90 tablet 3   No current facility-administered medications for this visit.     Objective:  BP 130/80   Pulse 74   Temp (!) 97.2 F (36.2 C)   Ht 6\' 1"  (1.854 m)   SpO2 94%   BMI 35.36 kg/m  Gen: NAD, resting comfortably CV: RRR no murmurs rubs or gallops Lungs: CTAB no crackles, wheeze, rhonchi Abdomen: soft/nontender/nondistended/normal bowel sounds.  Ext: trace edema Skin: warm, dry     Assessment and Plan   #social update- wife spending a lot of time helping mother in law in [place]- so he misses her. Has enjoyed basketball/march madness  #CAD - follows with Dr. Eden Emms #hyperlipidemia S: Medication: aspirin 81 mg (off lately- advised to restart) and rosuvastatin 40mg  as well as coreg 3.125 mg BID   -zetia very expensive on his plan and he  has declined -no chest pain or shortness of breath . Has tried to reduce red meat Lab Results  Component Value Date   CHOL 179 09/09/2022   HDL 69.90 09/09/2022   LDLCALC 93 09/09/2022   LDLDIRECT 81.0 07/20/2018   TRIG 82.0 09/09/2022   CHOLHDL 3 09/09/2022  A/P: coronary artery disease asymptomatic continue current medications - discussed adding zetia but wants to hold off. Is interested in LPA testing -restart aspirin    #hypertension- slight white coat element S: medication: Carvedilol 3.125 mg twice daily, losartan hydrochlorothiazide 100-25 mg, amlodipine 5 mg Home readings #s:  typically 130s/80 still . Did one check as low as 117/72 -still walking dog regualarly BP Readings from Last 3 Encounters:  11/11/23 130/80  08/18/23 (!) 142/88  06/03/23 124/80   A/P: well controlled continue current medications    # Hyperglycemia/insulin resistance/prediabetes-peak A1c 6.6x1.  With diagnose of diabetes if had another score of 6.5 or above S:  Medication: Metformin 500 mg daily Exercise and diet- walking 3-4 miles a day, sparing dessert and limiting carbohydrates. Down 4 lbs from last visit A/P: hopefully stable- update a1c today. Continue current meds for now   I discussed the microalbumin to creatinine lab error with patient that occurred with [place] labs.  Essentially the ratio was off by a factor of 10.  In this patient's individual case 22.8 would convert to 228- already on losartan- we would consider adding jardiance/labeling as diabetes    #Gout S: Has not wanted to take allopurinol.  Hydrochlorothiazide may increase risk. Last uric acid 2014- no recent flares Lab Results  Component Value Date   LABURIC 7.7 03/13/2013  A/P:wants to hold off on flares and testing    # very sparing indomethacin for msk  Pain such as knees-through orthopedist or [place]. Hasn't needed lately  # fatty liver- mild elevations in LFTs in past- better in 2019 with weight loss. 3 glasses of red wine per day- have encouraged max 2 per day  Lab Results  Component Value Date   ALT 23 09/09/2022   AST 24 09/09/2022   ALKPHOS 52 09/09/2022   BILITOT 1.0 09/09/2022  -also with mobrid obesity with BMI over 35 and hypertension and diabetes   Recommended follow up: Return in about 6 months (around 05/12/2024) for physical or sooner if needed.Schedule b4 you leave. Future Appointments  Date Time Provider Department Center  11/16/2023  2:45 PM Wendall Stade, MD [facility]  10/30/2024  9:20 AM [facility] ANNUAL WELLNESS VISIT 1 [facility]    Lab/Order associations:   ICD-10-CM   1. Coronary artery disease, unspecified vessel or lesion type, unspecified whether angina present, unspecified whether native or transplanted heart  I25.10     2. Essential  hypertension  I10     3. Hyperlipidemia, unspecified hyperlipidemia type  E78.5 Comprehensive metabolic panel with GFR    CBC with Differential/Platelet    Lipid panel    Lipoprotein A (LPA)    4. Screening for prostate cancer  Z12.5 PSA, Medicare    5. Prediabetes  R73.03 Hemoglobin A1c    Microalbumin / creatinine urine ratio    6. Microalbuminuria  R80.9 Hemoglobin A1c    Microalbumin / creatinine urine ratio      No orders of the defined types were placed in this encounter.   Return precautions advised.  Tana Conch, MD

## 2023-11-11 NOTE — Patient Instructions (Addendum)
 Please stop by lab before you go If you have mychart- we will send your results within 3 business days of [place] receiving them.  If you do not have mychart- we will call you about results within 5 business days of [place] receiving them.  *please also note that you will see labs on mychart as soon as they post. I will later go in and write notes on them- will say "notes from Dr. Durene Cal"   We may consider jardiance if albumin/creatinine ratio above 30 especially if worsening score  Recommended follow up: Return in about 6 months (around 05/12/2024) for physical or sooner if needed.Schedule b4 you leave.

## 2023-11-14 ENCOUNTER — Encounter: Payer: Self-pay | Admitting: Family Medicine

## 2023-11-14 NOTE — Telephone Encounter (Signed)
 Spoke with patient, patient requesting lab to be printed  Lab printed and mail to address provider by patient  And verified in patient chart

## 2023-11-16 ENCOUNTER — Ambulatory Visit: Payer: Medicare HMO | Attending: Cardiovascular Disease | Admitting: Cardiovascular Disease

## 2023-11-16 ENCOUNTER — Encounter: Payer: Self-pay | Admitting: Cardiovascular Disease

## 2023-11-16 ENCOUNTER — Encounter: Payer: Self-pay | Admitting: Family Medicine

## 2023-11-16 VITALS — BP 120/82 | HR 69 | Ht 73.0 in | Wt 272.0 lb

## 2023-11-16 DIAGNOSIS — I10 Essential (primary) hypertension: Secondary | ICD-10-CM | POA: Diagnosis not present

## 2023-11-16 DIAGNOSIS — I25810 Atherosclerosis of coronary artery bypass graft(s) without angina pectoris: Secondary | ICD-10-CM

## 2023-11-16 DIAGNOSIS — Z951 Presence of aortocoronary bypass graft: Secondary | ICD-10-CM | POA: Diagnosis not present

## 2023-11-16 LAB — LIPOPROTEIN A (LPA): Lipoprotein (a): 20 nmol/L (ref <75)

## 2023-11-16 MED ORDER — EMPAGLIFLOZIN 10 MG PO TABS: 10.0000 mg | ORAL_TABLET | Freq: Every day | ORAL | 11 refills | Status: DC

## 2023-11-16 MED ORDER — EZETIMIBE 10 MG PO TABS: 10.0000 mg | ORAL_TABLET | Freq: Every day | ORAL | 11 refills | Status: AC

## 2023-11-16 MED ORDER — EMPAGLIFLOZIN 10 MG PO TABS: 10.0000 mg | ORAL_TABLET | Freq: Every day | ORAL | Status: DC

## 2023-11-16 MED ORDER — LOSARTAN POTASSIUM-HCTZ 100-25 MG PO TABS
1.0000 | ORAL_TABLET | Freq: Every day | ORAL | 3 refills | Status: AC
Start: 2023-11-16 — End: ?

## 2023-11-16 NOTE — Patient Instructions (Addendum)
 Medication Instructions:  Your physician has recommended you make the following change in your medication:  1-START Jardiance 10 mg by mouth daily 2-START Zetia 10 mg by mouth daily  *If you need a refill on your cardiac medications before your next appointment, please call your pharmacy*  Lab Work: If you have labs (blood work) drawn today and your tests are completely normal, you will receive your results only by: MyChart Message (if you have MyChart) OR A paper copy in the mail If you have any lab test that is abnormal or we need to change your treatment, we will call you to review the results.  Testing/Procedures: None ordered today.  Follow-Up: At [facility], you and your health needs are our priority.  As part of our continuing mission to provide you with exceptional heart care, our providers are all part of one team.  This team includes your primary Cardiologist (physician) and Advanced Practice Providers or APPs (Physician Assistants and Nurse Practitioners) who all work together to provide you with the care you need, when you need it.  Your next appointment:   1 year(s)  Provider:   Janelle Mediate, MD     We recommend signing up for the patient portal called "MyChart".  Sign up information is provided on this After Visit Summary.  MyChart is used to connect with patients for Virtual Visits (Telemedicine).  Patients are able to view lab/test results, encounter notes, upcoming appointments, etc.  Non-urgent messages can be sent to your provider as well.   To learn more about what you can do with MyChart, go to [email or web address].   Other Instructions       1st Floor: - Lobby - Registration  - Pharmacy  - Lab - Cafe  2nd Floor: - PV Lab - Diagnostic Testing (echo, CT, nuclear med)  3rd Floor: - Vacant  4th Floor: - TCTS (cardiothoracic surgery) - AFib Clinic - Structural Heart Clinic - Vascular Surgery  - Vascular Ultrasound  5th Floor: -  [facility] (general and EP) - Clinical Pharmacy for coumadin, hypertension, lipid, weight-loss medications, and med management appointments    Valet parking services will be available as well.

## 2023-11-16 NOTE — Addendum Note (Signed)
 Addended by: Reyes Caul, Georganna Maxson L on: 11/16/2023 03:32 PM   Modules accepted: Orders

## 2023-11-17 ENCOUNTER — Other Ambulatory Visit: Payer: Self-pay | Admitting: Cardiovascular Disease

## 2023-11-29 ENCOUNTER — Other Ambulatory Visit: Payer: Self-pay | Admitting: Cardiovascular Disease

## 2023-12-01 ENCOUNTER — Other Ambulatory Visit: Payer: Self-pay

## 2023-12-01 MED ORDER — AMLODIPINE BESYLATE 5 MG PO TABS: 5.0000 mg | ORAL_TABLET | Freq: Every day | ORAL | 3 refills | Status: AC

## 2024-03-08 ENCOUNTER — Other Ambulatory Visit: Payer: Self-pay | Admitting: Family Medicine

## 2024-05-14 ENCOUNTER — Encounter: Admitting: Family Medicine

## 2024-05-25 ENCOUNTER — Encounter: Payer: Self-pay | Admitting: Family Medicine

## 2024-05-25 ENCOUNTER — Ambulatory Visit (INDEPENDENT_AMBULATORY_CARE_PROVIDER_SITE_OTHER): Admitting: Family Medicine

## 2024-05-25 ENCOUNTER — Ambulatory Visit: Payer: Self-pay | Admitting: Family Medicine

## 2024-05-25 VITALS — BP 130/72 | HR 67 | Temp 98.4°F | Ht 73.0 in | Wt 274.0 lb

## 2024-05-25 DIAGNOSIS — I2510 Atherosclerotic heart disease of native coronary artery without angina pectoris: Secondary | ICD-10-CM | POA: Diagnosis not present

## 2024-05-25 DIAGNOSIS — E118 Type 2 diabetes mellitus with unspecified complications: Secondary | ICD-10-CM | POA: Diagnosis not present

## 2024-05-25 DIAGNOSIS — E785 Hyperlipidemia, unspecified: Secondary | ICD-10-CM

## 2024-05-25 DIAGNOSIS — I10 Essential (primary) hypertension: Secondary | ICD-10-CM

## 2024-05-25 DIAGNOSIS — Z125 Encounter for screening for malignant neoplasm of prostate: Secondary | ICD-10-CM | POA: Diagnosis not present

## 2024-05-25 DIAGNOSIS — Z7984 Long term (current) use of oral hypoglycemic drugs: Secondary | ICD-10-CM

## 2024-05-25 DIAGNOSIS — Z0000 Encounter for general adult medical examination without abnormal findings: Secondary | ICD-10-CM | POA: Diagnosis not present

## 2024-05-25 LAB — COMPREHENSIVE METABOLIC PANEL WITH GFR
ALT: 30 U/L (ref 0–53)
AST: 31 U/L (ref 0–37)
Albumin: 4.8 g/dL (ref 3.5–5.2)
Alkaline Phosphatase: 47 U/L (ref 39–117)
BUN: 17 mg/dL (ref 6–23)
CO2: 28 meq/L (ref 19–32)
Calcium: 9.6 mg/dL (ref 8.4–10.5)
Chloride: 94 meq/L — ABNORMAL LOW (ref 96–112)
Creatinine, Ser: 0.92 mg/dL (ref 0.40–1.50)
GFR: 82.75 mL/min (ref >60.00)
Glucose, Bld: 100 mg/dL — ABNORMAL HIGH (ref 70–99)
Potassium: 4.5 meq/L (ref 3.5–5.1)
Sodium: 133 meq/L — ABNORMAL LOW (ref 135–145)
Total Bilirubin: 1.1 mg/dL (ref 0.2–1.2)
Total Protein: 7.7 g/dL (ref 6.0–8.3)

## 2024-05-25 LAB — CBC WITH DIFFERENTIAL/PLATELET
Basophils Absolute: 0 K/uL (ref 0.0–0.1)
Basophils Relative: 0.7 % (ref 0.0–3.0)
Eosinophils Absolute: 0.1 K/uL (ref 0.0–0.7)
Eosinophils Relative: 1.4 % (ref 0.0–5.0)
HCT: 37.4 % — ABNORMAL LOW (ref 39.0–52.0)
Hemoglobin: 12.6 g/dL — ABNORMAL LOW (ref 13.0–17.0)
Lymphocytes Relative: 32.4 % (ref 12.0–46.0)
Lymphs Abs: 1.5 K/uL (ref 0.7–4.0)
MCHC: 33.7 g/dL (ref 30.0–36.0)
MCV: 101.4 fl — ABNORMAL HIGH (ref 78.0–100.0)
Monocytes Absolute: 0.6 K/uL (ref 0.1–1.0)
Monocytes Relative: 13.5 % — ABNORMAL HIGH (ref 3.0–12.0)
Neutro Abs: 2.5 K/uL (ref 1.4–7.7)
Neutrophils Relative %: 52 % (ref 43.0–77.0)
Platelets: 186 K/uL (ref 150.0–400.0)
RBC: 3.69 Mil/uL — ABNORMAL LOW (ref 4.22–5.81)
RDW: 14.6 % (ref 11.5–15.5)
WBC: 4.7 K/uL (ref 4.0–10.5)

## 2024-05-25 LAB — MICROALBUMIN / CREATININE URINE RATIO
Creatinine,U: 78.7 mg/dL
Microalb Creat Ratio: 123.9 mg/g — ABNORMAL HIGH (ref 0.0–30.0)
Microalb, Ur: 9.7 mg/dL — ABNORMAL HIGH (ref 0.0–1.9)

## 2024-05-25 LAB — LIPID PANEL
Cholesterol: 148 mg/dL (ref 0–200)
HDL: 58.1 mg/dL (ref >39.00)
LDL Cholesterol: 66 mg/dL (ref 0–99)
NonHDL: 89.98
Total CHOL/HDL Ratio: 3
Triglycerides: 119 mg/dL (ref 0.0–149.0)
VLDL: 23.8 mg/dL (ref 0.0–40.0)

## 2024-05-25 LAB — HEMOGLOBIN A1C: Hgb A1c MFr Bld: 6.2 % (ref 4.6–6.5)

## 2024-05-25 NOTE — Patient Instructions (Addendum)
 Please stop by lab before you go If you have mychart- we will send your results within 3 business days of us  receiving them.  If you do not have mychart- we will call you about results within 5 business days of us  receiving them.  *please also note that you will see labs on mychart as soon as they post. I will later go in and write notes on them- will say notes from Dr. Katrinka   No changes today unless labs lead us  to make changes  Tetanus, Diphtheria, and Pertussis (Tdap) recommended at pharmacy  Recommended follow up: Return in about 6 months (around 11/23/2024) for followup or sooner if needed.Schedule b4 you leave. As long as a1c under 7

## 2024-05-25 NOTE — Progress Notes (Signed)
 Phone: [phone]   Subjective:  Patient presents today for their annual physical. Chief complaint-noted.   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 Delford   Diabetes mellitus without complication (HCC)    Foot swelling    Gout    History of kidney stones    Hyperlipidemia    Hypertension    Myocardial infarction (HCC) 1997   Nocturia    Obesity    Patellar tendonitis    Personal history of colonic polyp-adenoma  11/20/2013   Plantar fasciitis    Patient Active Problem List   Diagnosis Date Noted   Controlled diabetes mellitus type 2 with complications (HCC) 12/04/2019    Priority: High   CAD (coronary artery disease) 02/21/2007    Priority: High   Fatty liver 12/19/2012    Priority: Medium    Gout 09/01/2009    Priority: Medium    Hyperlipidemia 02/21/2007    Priority: Medium    Essential hypertension 02/21/2007    Priority: Medium    Plantar fasciitis     Priority: Low   Left hip pain 11/20/2014    Priority: Low   Dermatitis 11/20/2014    Priority: Low   History of colonic polyps 11/20/2013    Priority: Low   Morbid obesity, unspecified obesity type (HCC) 06/13/2013    Priority: Low   S/P right THA, AA 06/12/2013    Priority: Low   Left foot pain 12/19/2012    Priority: Low   Actinic keratosis 08/03/2012    Priority: Low   Kidney stone 02/21/2007    Priority: Low   Benign neoplasm of descending colon    Pain in right knee 01/13/2018   Past Surgical History:  Procedure Laterality Date   APPENDECTOMY     COLONOSCOPY WITH PROPOFOL   11/14/2013   Dr.Gessner   COLONOSCOPY WITH PROPOFOL  N/A 08/11/2021   Procedure: COLONOSCOPY WITH PROPOFOL ;   Surgeon: Avram Lupita BRAVO, MD;  Location: [facility] END[facility]COPY;  Service: Endoscopy;  Laterality: N/A;   CORONARY ARTERY BYPASS GRAFT  1997   3 VESSELS   HEMICOLECTOMY Right 2007   low grade mucnous appendiceal neoplasm   HERNIA REPAIR     INGUINAL HERNIA REPAIR Right 03/17/2020   Procedure: RIGHT INGUINAL HERNIA REPAIR;  Surgeon: Rubin Calamity, MD;  Location[facility];  Service: General;  Laterality: Right;   INSERTION OF MESH Right 03/17/2020   Procedure: INSERTION OF MESH;  Surgeon: Rubin Calamity, MD;  Location: [facility] OR;  Service: General;  Laterality: Right;   JOINT REPLACEMENT Right 2014   POLYPECTOMY  08/11/2021   Procedure: POLYPECTOMY;  Surgeon: Avram Lupita BRAVO, MD;  Location: [facility] ENDOSCOPY;  Service: Endoscopy;;   TONSILLECTOMY     TOTAL HIP ARTHROPLASTY Right 06/12/2013   Procedure: RIGHT TOTAL HIP ARTHROPLASTY ANTERIOR APPROACH;  Surgeon: Donnice JONETTA Car, MD;  Location: [facility] ORS;  Service: Orthopedics;  Laterality: Right;    Family History  Problem Relation Age of Onset   Stroke Mother    Heart disease Mother    Aneurysm Father    Colon cancer Neg Hx    Esophageal cancer Neg Hx    Rectal cancer Neg Hx    Stomach cancer  Neg Hx     Medications- reviewed and updated Current Outpatient Medications  Medication Sig Dispense Refill   amLODipine  (NORVASC ) 5 MG tablet Take 1 tablet (5 mg total) by mouth daily. 90 tablet 3   aspirin  EC 81 MG tablet Take 1 tablet (81 mg total) by mouth daily. 90 tablet 3   carvedilol  (COREG ) 3.125 MG tablet Take 1 tablet (3.125 mg total) by mouth 2 (two) times daily with a meal. 180 tablet 3   losartan -hydrochlorothiazide (HYZAAR) 100-25 MG tablet Take 1 tablet by mouth daily. 90 tablet 3   metFORMIN  (GLUCOPHAGE ) 500 MG tablet TAKE 1 TABLET BY MOUTH DAILY WITH BREAKFAST 100 tablet 3   nitroGLYCERIN  (NITROSTAT ) 0.4 MG SL tablet Place 1 tablet (0.4 mg total) under the tongue every 5 (five) minutes as needed for chest pain. 25 tablet 3   rosuvastatin  (CRESTOR )  40 MG tablet TAKE 1 TABLET BY MOUTH DAILY 90 tablet 3   amoxicillin  (AMOXIL ) 500 MG capsule Take 500 mg by mouth. Pt takes 4 tablets 1 hour prior to dental appointments. (Patient not taking: Reported on 11/16/2023)     ezetimibe  (ZETIA ) 10 MG tablet Take 1 tablet (10 mg total) by mouth daily. (Patient not taking: Reported on 05/25/2024) 30 tablet 11   indomethacin  (INDOCIN ) 25 MG capsule indomethacin  25 mg capsule  TAKE TWO CAPSULES PER DAY AFTER MEALS AS NEEDED for pain (Patient not taking: Reported on 05/25/2024) 60 capsule 3   No current facility-administered medications for this visit.    Allergies-reviewed and updated Allergies  Allergen Reactions   Bee Venom Anaphylaxis    Due to heart medicine    Gadolinium Derivatives Shortness Of Breath, Itching, Swelling, Cough and Anaphylaxis    Symptoms include: SOB, wheezing, throat numbness, cough, congestion of nasal cavities, itching of hand and foot, and swelling. Contrast agent given- MultiHance .   Poison Ivy Extract Other (See Comments)    Sometimes    Social History   Social History Narrative   Married. No kids. 1 stapdaughter 31- no grandkids. 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       Hobbies: enjoys walking (plantar fasciitis inhibits and also patellar tendonitis), trout fishing years ago   Objective  Objective:  BP 130/72 (BP Location: Left Arm, Patient Position: Sitting, Cuff Size: Normal)   Pulse 67   Temp 98.4 F (36.9 C) (Tympanic)   Ht 6' 1 (1.854 m)   Wt 274 lb (124.3 kg)   SpO2 98%   BMI 36.15 kg/m  Gen: NAD, resting comfortably HEENT: Mucous membranes are moist. Oropharynx normal Neck: no thyromegaly CV: RRR no murmurs rubs or gallops Lungs: CTAB no crackles, wheeze, rhonchi Abdomen: soft/nontender/nondistended/normal bowel sounds. No rebound or guarding.  Ext: trace  edema Skin: warm, dry Neuro: grossly normal, moves all extremities, PERRLA  Diabetic foot exam was performed with the following findings:   No deformities, ulcerations, or other skin breakdown Normal sensation of 10g monofilament Intact posterior tibialis and dorsalis pedis pulses Callous on 3rd toe tip bilaterally - reports able to pull off and shave down with no deeper issues      Assessment and Plan  73 y.o. male presenting for annual physical.  Health Maintenance counseling: 1. Anticipatory guidance: Patient counseled regarding regular dental exams -q6 months, eye exams - Dr. Stephania yearly,  avoiding smoking and second hand smoke , limiting alcohol to 2 beverages per  day - 3 per day- up to 4- advised 2 max, no illicit drugs .   2. Risk factor reduction:  Advised patient of need for regular exercise and diet rich and fruits and vegetables to reduce risk of heart attack and stroke.  Exercise- great job with walking 1000 miles a year.  Diet/weight management-weight stable from last year- BMI over 35 with hypertension and hyperlipidemia and diabetes - morbid obesity. Feels could reduce ice cream Wt Readings from Last 3 Encounters:  05/25/24 274 lb (124.3 kg)  11/16/23 272 lb (123.4 kg)  10/24/23 268 lb (121.6 kg)  3. Immunizations/screenings/ancillary studies- Tetanus, Diphtheria, and Pertussis (Tdap) recommended at pharmacy -he's holding off on shingrix and flu and covid Immunization History  Administered Date(s) Administered   Influenza-Unspecified 06/24/2023   PFIZER(Purple Top)SARS-COV-2 Vaccination 09/06/2019, 09/27/2019, 05/02/2020, 03/24/2021   Pfizer Covid-19 Vaccine Bivalent Booster 57yrs & up 07/17/2021   Pneumococcal Conjugate-13 11/20/2014   Pneumococcal Polysaccharide-23 09/27/2016   Pneumococcal-Unspecified 08/03/2015   Tdap 01/29/2008, 02/26/2013   Zoster, Live 08/22/2013  4. Prostate cancer screening- low risk prior trend on last psa Lab Results  Component Value  Date   PSA 0.91 11/11/2023   PSA 0.81 09/09/2022   PSA 0.66 03/26/2021   5. Colon cancer screening - 08/11/21 with 5 year repeat wtith Dr. Avram 6. Skin cancer screening- plans to see [facility] soon. advised regular sunscreen use. Denies worrisome, changing, or new skin lesions.  7. Smoking associated screening (lung cancer screening, AAA screen 65-75, UA)- never smoker 8. STD screening - only active with spounse   Status of chronic or acute concerns   #social update- wife still helping his mother in law in [place] a lot  #CAD - follows with Dr. Nishan #hyperlipidemia- lpa not elevated  S: Medication: aspirin  81 mg and rosuvastatin  40mg  as well as coreg  3.125 mg BID   -zetia   caused queeziness at night and he stopped after 2 weeks and symptom(s) resolved -no chest pain or shortness of breath - 860 miles walking this year!  Lab Results  Component Value Date   CHOL 162 11/11/2023   HDL 63.80 11/11/2023   LDLCALC 81 11/11/2023   LDLDIRECT 81.0 07/20/2018   TRIG 86.0 11/11/2023   CHOLHDL 3 11/11/2023  A/P: coronary artery disease asymptomatic continue current medications  Lipids just hair above goal but Zetia  with intolerable side effects and bempedoic acid not ideal with gout   #hypertension S: medication: Carvedilol  3.125 mg twice daily, losartan  hydrochlorothiazide 100-25 mg, amlodipine  5 mg Home readings #s:  no recent checks.  BP Readings from Last 3 Encounters:  05/25/24 130/72  11/16/23 120/82  11/11/23 130/80  A/P: stable- continue current medicines    # Diabetes- new diagnosis 11/11/23 with second a1c elevaed S:  Medication: Metformin  500 mg daily Jardiance  10 mg started by cardiology but costly Lab Results  Component Value Date   HGBA1C 6.5 11/11/2023   HGBA1C 6.4 09/09/2022   HGBA1C 6.3 03/26/2021  A/P: diabetes hopefully stable- update a1c today. Continue current meds for now  Microalbuminuria-228 down to 188 already on losartan - jardiance  too  costly- recheck UACR today    #Gout S: Has not wanted to take allopurinol.  Hydrochlorothiazide may increase risk. Last uric acid 2014 A/P:no recent flares- continue to monitor     # very sparing indomethacin  for msk  Pain such as knees-through orthopedist or us . He is aware of slight increased CV risk with this but uses less than once a month- not needing lately  #  fatty liver- mild elevations in LFTs in past- better in 2019 with weight loss. 3 glasses of red wine per day- have encouraged max 2 per day - uchanged- but encouraged -discussed GLP-1 agonist - wants to hold off- would be more interested in pills  Recommended follow up: Return in about 6 months (around 11/23/2024) for followup or sooner if needed.Schedule b4 you leave. Future Appointments  Date Time Provider Department Center  10/30/2024  9:20 AM [facility] ANNUAL WELLNESS VISIT 1 [facility] [place]   Lab/Order associations: fasting   ICD-10-CM   1. Preventative health care  Z00.00     2. Controlled diabetes mellitus type 2 with complications (HCC)  E11.8 Comprehensive metabolic panel with GFR    CBC with Differential/Platelet    Lipid panel    Microalbumin / creatinine urine ratio    Hemoglobin A1c    3. Coronary artery disease, unspecified vessel or lesion type, unspecified whether angina present, unspecified whether native or transplanted heart  I25.10     4. Hyperlipidemia, unspecified hyperlipidemia type  E78.5     5. Essential hypertension  I10     6. Screening for prostate cancer  Z12.5       No orders of the defined types were placed in this encounter.   Return precautions advised.  Garnette Lukes, MD

## 2024-08-16 NOTE — Progress Notes (Signed)
 Shawn Anderson                                          MRN: [id number]   08/16/2024   The [facility] Quality Team Specialist reviewed this patient medical record for the purposes of chart review for care gap closure. The following were reviewed: abstraction for care gap closure-controlling blood pressure.    [facility]BCI Quality Team

## 2024-08-23 ENCOUNTER — Other Ambulatory Visit: Payer: Self-pay | Admitting: Family Medicine

## 2024-08-24 ENCOUNTER — Other Ambulatory Visit (HOSPITAL_COMMUNITY): Payer: Self-pay

## 2024-08-24 ENCOUNTER — Telehealth: Payer: Self-pay

## 2024-08-24 NOTE — Telephone Encounter (Signed)
 Pharmacy Patient Advocate Encounter   Received notification from [facility] KEY that prior authorization for Indomethacin  25MG  Caps is required/requested.   Insurance verification completed.   The patient is insured through [employer].   Per test claim:  Ibuprofen tabs, Naproxen  tabs, Diclofenac  tabs, Nabumetone tabs is preferred by the insurance.  If suggested medication is appropriate, Please send in a new RX and discontinue this one. If not, please advise as to why it's not appropriate so that we may request a Prior Authorization. Please note, some preferred medications may still require a PA.  If the suggested medications have not been trialed and there are no contraindications to their use, the PA will not be submitted, as it will not be approved. Archived Key: [id number]

## 2024-08-30 NOTE — Telephone Encounter (Signed)
 Please tell patient about denial by insurance- he may want to try goodRX (wouldn't go to his deductible) or we could send in alternate like diclofenac . We have to try alternates for them to even potentially cover it

## 2024-08-30 NOTE — Telephone Encounter (Signed)
 Patient stated he filled this and got it through [facility] for [phone] he is okay with using this from now on.

## 2024-10-30 ENCOUNTER — Ambulatory Visit

## 2024-11-23 ENCOUNTER — Ambulatory Visit: Admitting: Family Medicine
